# Patient Record
Sex: Female | Born: 1935 | Race: Black or African American | Hispanic: No | Smoking: Never smoker
Health system: Southern US, Community
[De-identification: ages and names within clinical notes are randomized; demographics above are authoritative.]

## PROBLEM LIST (undated history)

## (undated) DIAGNOSIS — I10 Essential (primary) hypertension: Secondary | ICD-10-CM

## (undated) DIAGNOSIS — E119 Type 2 diabetes mellitus without complications: Secondary | ICD-10-CM

## (undated) DIAGNOSIS — K219 Gastro-esophageal reflux disease without esophagitis: Secondary | ICD-10-CM

## (undated) DIAGNOSIS — M1990 Unspecified osteoarthritis, unspecified site: Secondary | ICD-10-CM

## (undated) DIAGNOSIS — E7800 Pure hypercholesterolemia, unspecified: Secondary | ICD-10-CM

## (undated) DIAGNOSIS — F0390 Unspecified dementia without behavioral disturbance: Secondary | ICD-10-CM

## (undated) HISTORY — PX: TONSILLECTOMY: SUR1361

## (undated) HISTORY — PX: APPENDECTOMY: SHX54

## (undated) HISTORY — PX: CATARACT EXTRACTION, BILATERAL: SHX1313

## (undated) HISTORY — PX: HEMORRHOID SURGERY: SHX153

## (undated) HISTORY — PX: CHOLECYSTECTOMY: SHX55

## (undated) HISTORY — PX: ABDOMINAL HYSTERECTOMY: SHX81

---

## 1998-10-17 ENCOUNTER — Ambulatory Visit (HOSPITAL_COMMUNITY): Admission: RE | Admit: 1998-10-17 | Discharge: 1998-10-17 | Payer: Self-pay | Admitting: Internal Medicine

## 1998-10-17 ENCOUNTER — Encounter: Payer: Self-pay | Admitting: Internal Medicine

## 2001-08-16 ENCOUNTER — Encounter: Payer: Self-pay | Admitting: Ophthalmology

## 2001-08-19 ENCOUNTER — Ambulatory Visit (HOSPITAL_COMMUNITY): Admission: RE | Admit: 2001-08-19 | Discharge: 2001-08-19 | Payer: Self-pay | Admitting: Ophthalmology

## 2001-12-12 ENCOUNTER — Ambulatory Visit (HOSPITAL_COMMUNITY): Admission: RE | Admit: 2001-12-12 | Discharge: 2001-12-12 | Payer: Self-pay | Admitting: Ophthalmology

## 2002-02-20 ENCOUNTER — Ambulatory Visit (HOSPITAL_COMMUNITY): Admission: RE | Admit: 2002-02-20 | Discharge: 2002-02-20 | Payer: Self-pay | Admitting: Family Medicine

## 2002-02-20 ENCOUNTER — Encounter: Payer: Self-pay | Admitting: Family Medicine

## 2003-04-17 ENCOUNTER — Ambulatory Visit (HOSPITAL_COMMUNITY): Admission: RE | Admit: 2003-04-17 | Discharge: 2003-04-17 | Payer: Self-pay | Admitting: Internal Medicine

## 2003-04-17 ENCOUNTER — Encounter: Payer: Self-pay | Admitting: Internal Medicine

## 2004-01-30 ENCOUNTER — Emergency Department (HOSPITAL_COMMUNITY): Admission: EM | Admit: 2004-01-30 | Discharge: 2004-01-30 | Payer: Self-pay | Admitting: Emergency Medicine

## 2004-05-07 ENCOUNTER — Ambulatory Visit: Payer: Self-pay | Admitting: Nurse Practitioner

## 2004-08-21 ENCOUNTER — Emergency Department (HOSPITAL_COMMUNITY): Admission: EM | Admit: 2004-08-21 | Discharge: 2004-08-21 | Payer: Self-pay | Admitting: Emergency Medicine
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
## 2005-08-18 ENCOUNTER — Ambulatory Visit (HOSPITAL_COMMUNITY): Admission: RE | Admit: 2005-08-18 | Discharge: 2005-08-18 | Payer: Self-pay | Admitting: Gastroenterology

## 2006-11-29 ENCOUNTER — Encounter: Admission: RE | Admit: 2006-11-29 | Discharge: 2006-11-29 | Payer: Self-pay | Admitting: Family Medicine

## 2007-01-04 ENCOUNTER — Emergency Department (HOSPITAL_COMMUNITY): Admission: EM | Admit: 2007-01-04 | Discharge: 2007-01-04 | Payer: Self-pay | Admitting: Family Medicine

## 2007-09-01 ENCOUNTER — Ambulatory Visit (HOSPITAL_COMMUNITY): Admission: RE | Admit: 2007-09-01 | Discharge: 2007-09-01 | Payer: Self-pay | Admitting: Ophthalmology

## 2008-05-30 IMAGING — CT CT ABDOMEN W/ CM
1 of 5 series · 14 of 36 positions shown, 19 images · IV contrast (READICAT/WATER & [ID] OMNI 300)
Comparison: None.

CLINICAL DATA: Abdominal pain and bloating.
ABDOMEN CT WITH CONTRAST:
TECHNIQUE: Multidetector CT imaging of the abdomen was performed following the standard protocol during bolus administration of intravenous contrast.
Contrast:  100 cc of Omnipaque 300.
TECHNIQUE: Multidetector CT imaging of the pelvis was performed following the standard protocol during bolus administration of intravenous contrast.

[Series 2: abdomen w/ · axial · 0.92mm/px · z∈[-375,-40]mm · 14 of 77 slices shown, 19 images]
[im 5/77  soft-tissue]
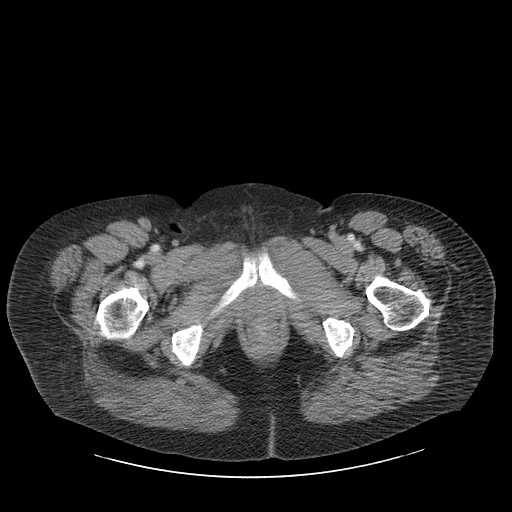
[im 5/77  bone]
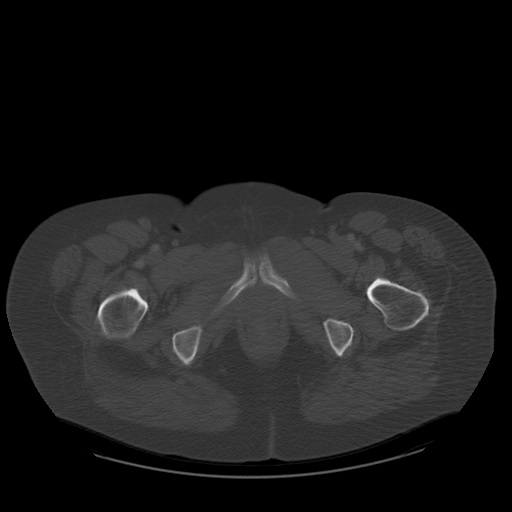
[im 9/77  soft-tissue]
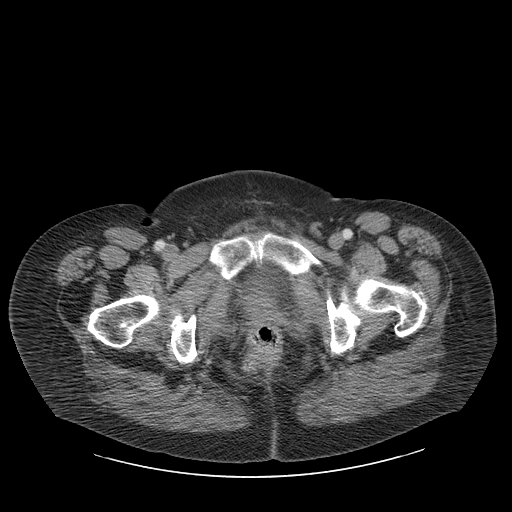
[im 17/77  soft-tissue]
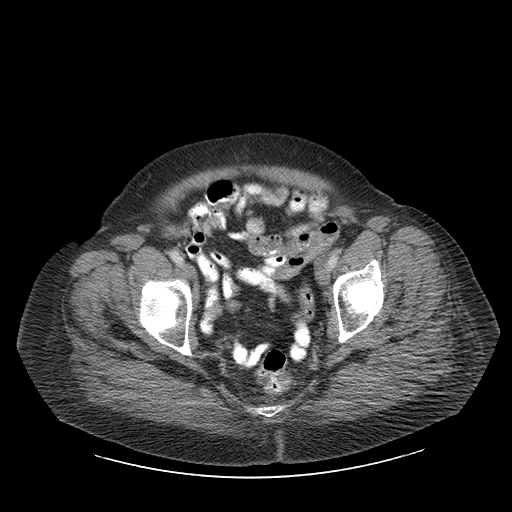
[im 22/77  soft-tissue]
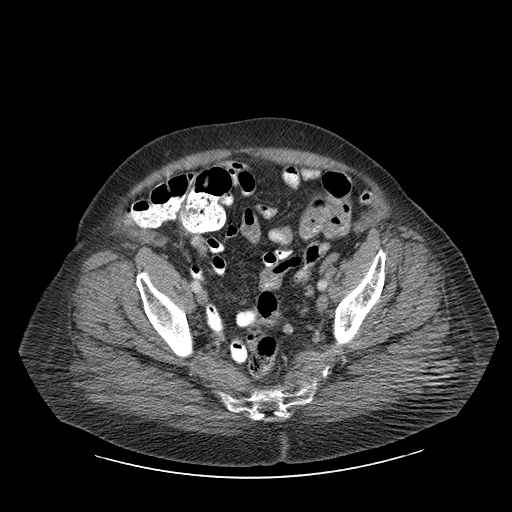
[im 26/77  soft-tissue]
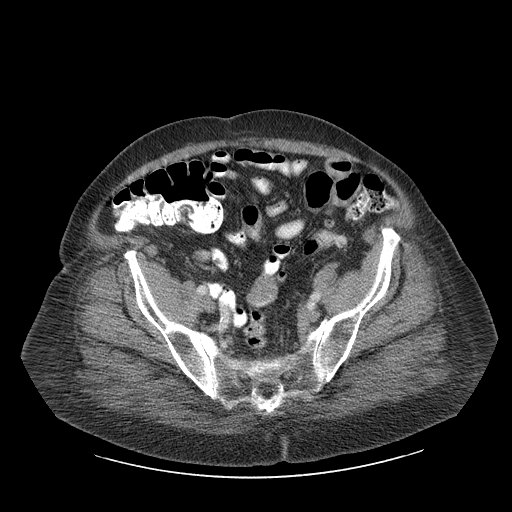
[im 34/77  soft-tissue]
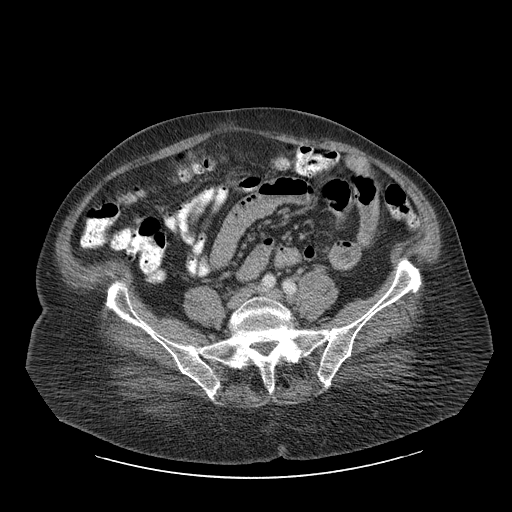
[im 39/77  soft-tissue]
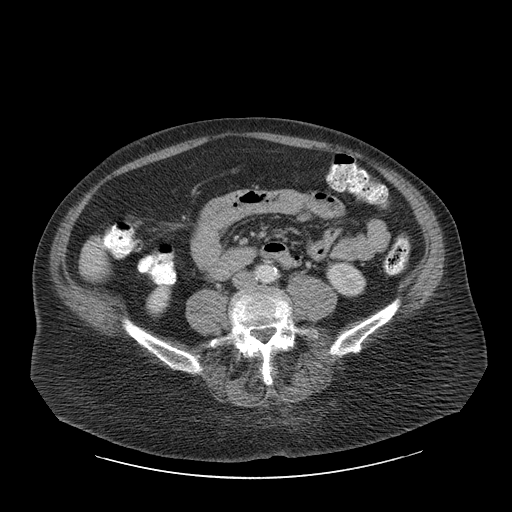
[im 43/77  soft-tissue]
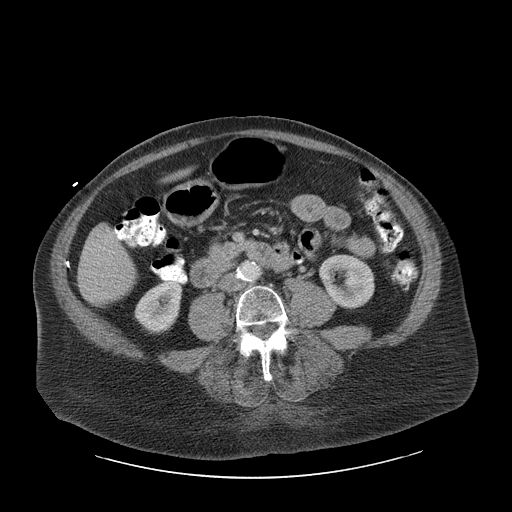
[im 51/77  soft-tissue]
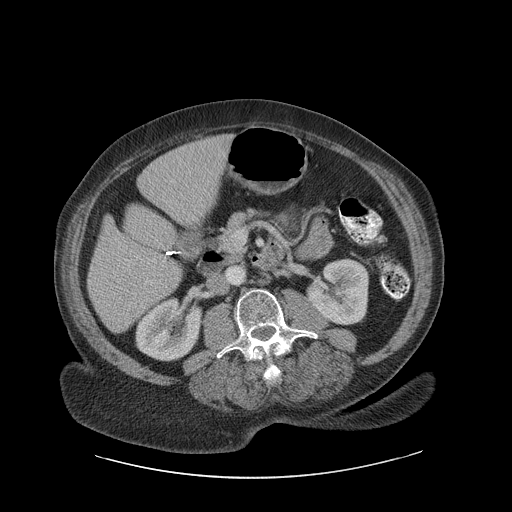
[im 51/77  bone]
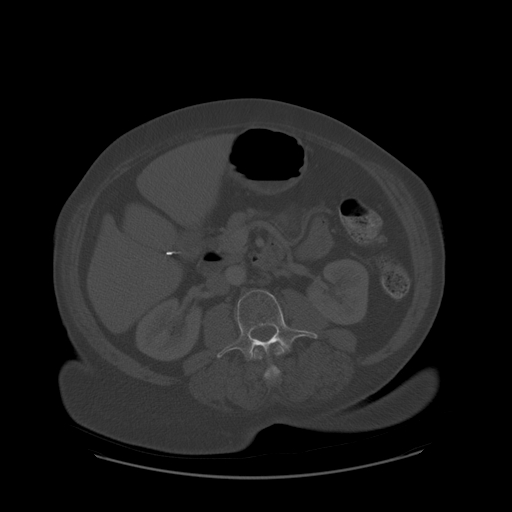
[im 55/77  soft-tissue]
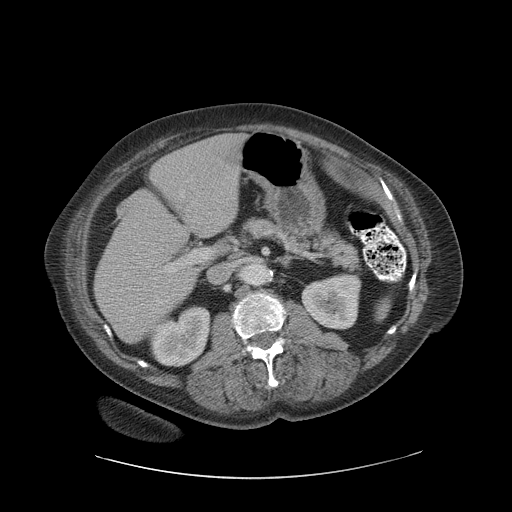
[im 60/77  soft-tissue]
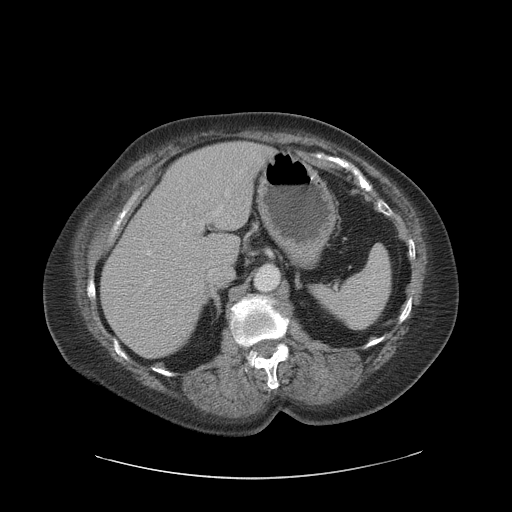
[im 60/77  lung]
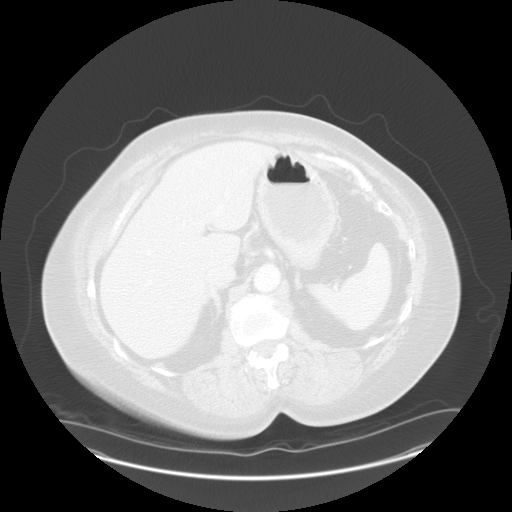
[im 64/77  lung]
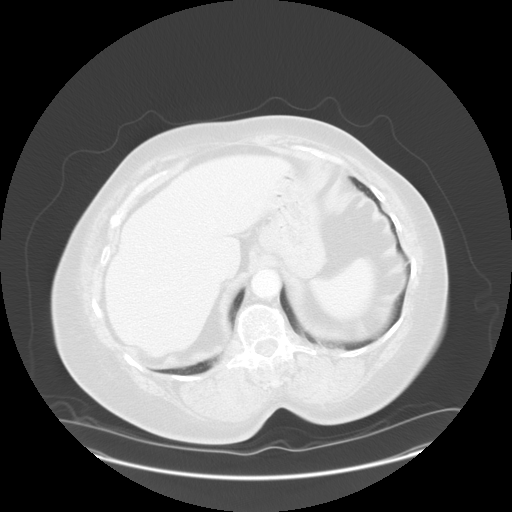
[im 68/77  soft-tissue]
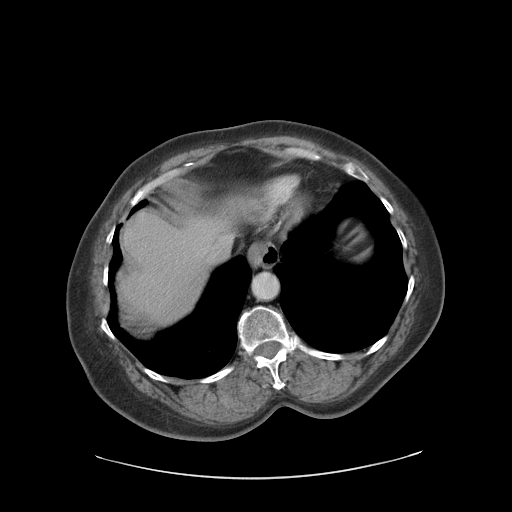
[im 68/77  lung]
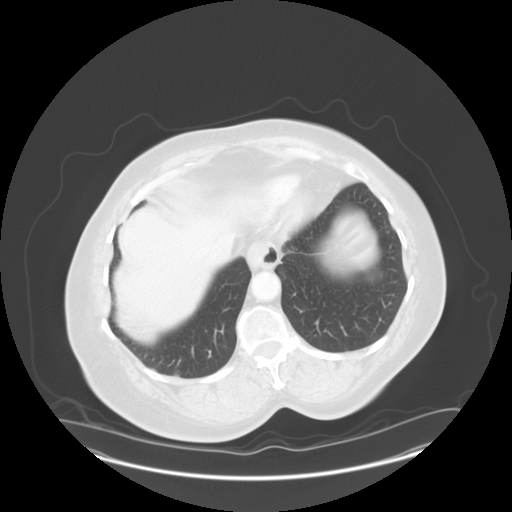
[im 72/77  soft-tissue]
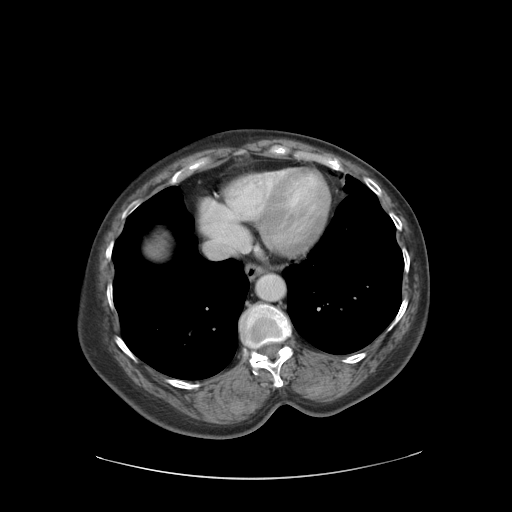
[im 72/77  lung]
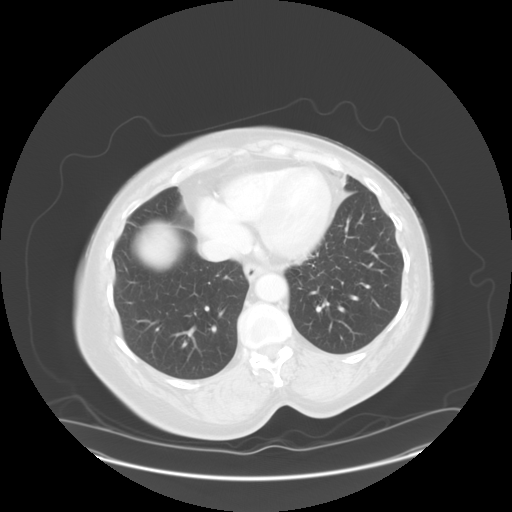

[14 of 36 positions shown; findings below may reference images not displayed]

FINDINGS: Liver unremarkable.  Cholecystectomy.  Adrenal glands unremarkable.  Small stone is seen in the lower pole of the right kidney without obstruction.  Small low attenuation lesion in the interpolar left kidney.  Probable left parapelvic cyst.  Spleen, pancreas, stomach, and small bowel are unremarkable.  Duodenal diverticulum is incidentally noted. No pathologically enlarged lymph nodes.  No free fluid.  Atherosclerotic calcification of the arterial vasculature.
IMPRESSION: 1.  No acute findings.
2.  obstructing right nephrolithiasis.
PELVIS CT WITH CONTRAST:
FINDINGS: Colon unremarkable.  No free fluid.  No pathologically enlarged lymph nodes.  No worrisome lytic or sclerotic lesions.  There is a diverticulum or saccule off the right posterolateral aspect of the bladder.
IMPRESSION: No acute findings.
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
## 2009-05-19 ENCOUNTER — Emergency Department (HOSPITAL_COMMUNITY): Admission: EM | Admit: 2009-05-19 | Discharge: 2009-05-19 | Payer: Self-pay | Admitting: Emergency Medicine

## 2009-09-01 ENCOUNTER — Emergency Department (HOSPITAL_COMMUNITY): Admission: EM | Admit: 2009-09-01 | Discharge: 2009-09-01 | Payer: Self-pay | Admitting: Emergency Medicine

## 2010-08-24 ENCOUNTER — Encounter: Payer: Self-pay | Admitting: Family Medicine

## 2010-10-19 LAB — POCT I-STAT, CHEM 8
BUN: 9 mg/dL (ref 6–23)
Calcium, Ion: 1.12 mmol/L (ref 1.12–1.32)
Chloride: 106 mEq/L (ref 96–112)
Creatinine, Ser: 0.8 mg/dL (ref 0.4–1.2)
Glucose, Bld: 76 mg/dL (ref 70–99)
HCT: 38 % (ref 36.0–46.0)
Hemoglobin: 12.9 g/dL (ref 12.0–15.0)
Potassium: 3.7 mEq/L (ref 3.5–5.1)
Sodium: 142 mEq/L (ref 135–145)
TCO2: 31 mmol/L (ref 0–100)

## 2010-11-18 IMAGING — CR DG KNEE COMPLETE 4+V*L*
4 series · 4 of 4 positions shown · non-contrast
Comparison: None

CLINICAL DATA: Fall, left knee pain

LEFT KNEE - COMPLETE 4+ VIEW

[view not recorded (1 of 4)]
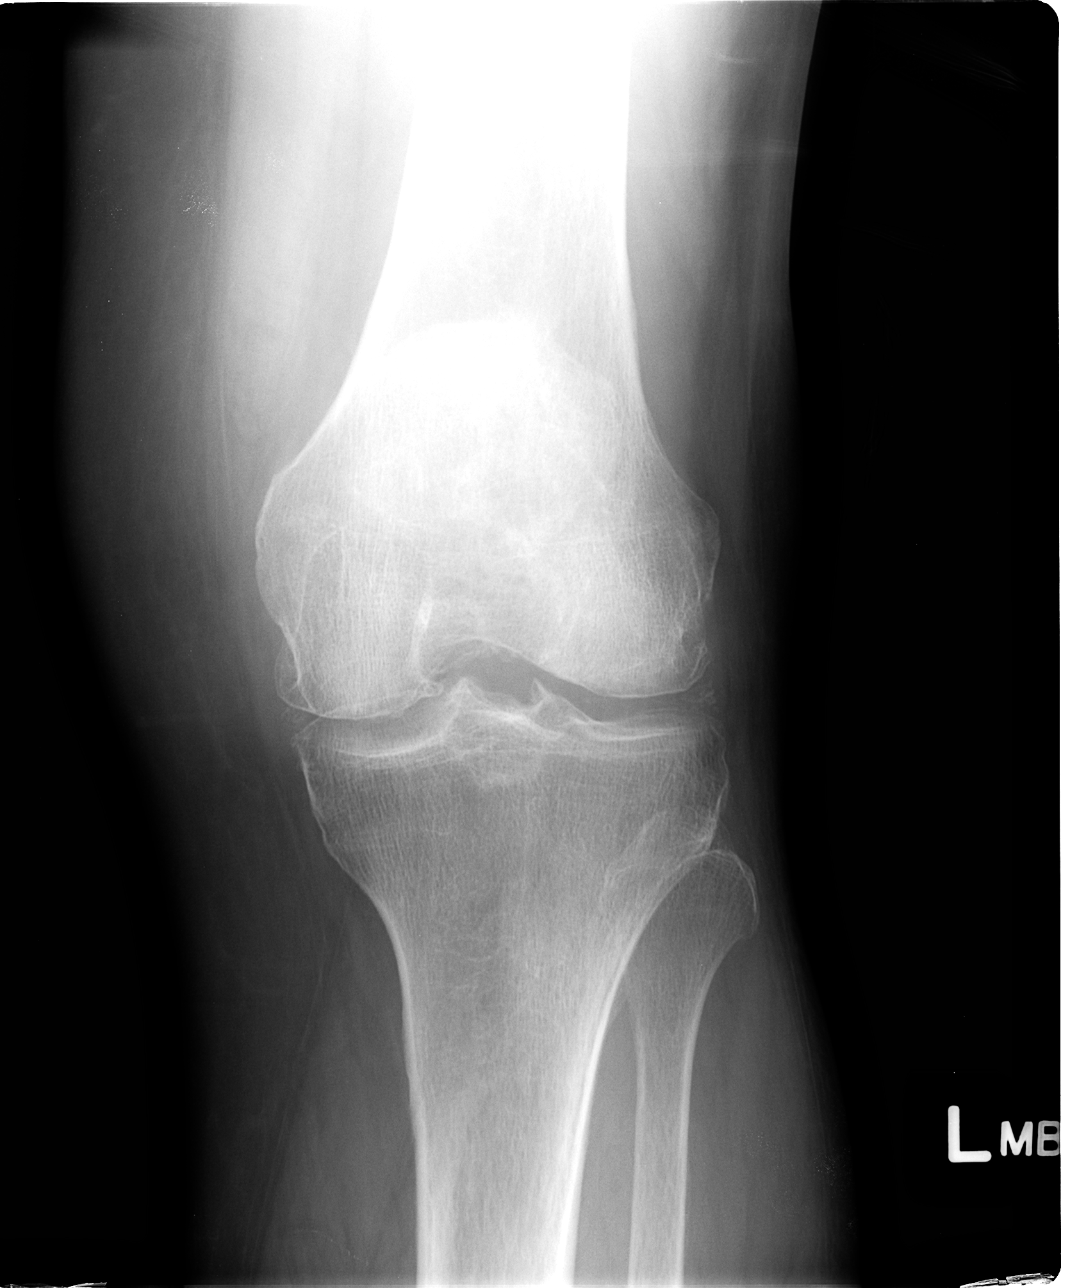

[view not recorded (2 of 4)]
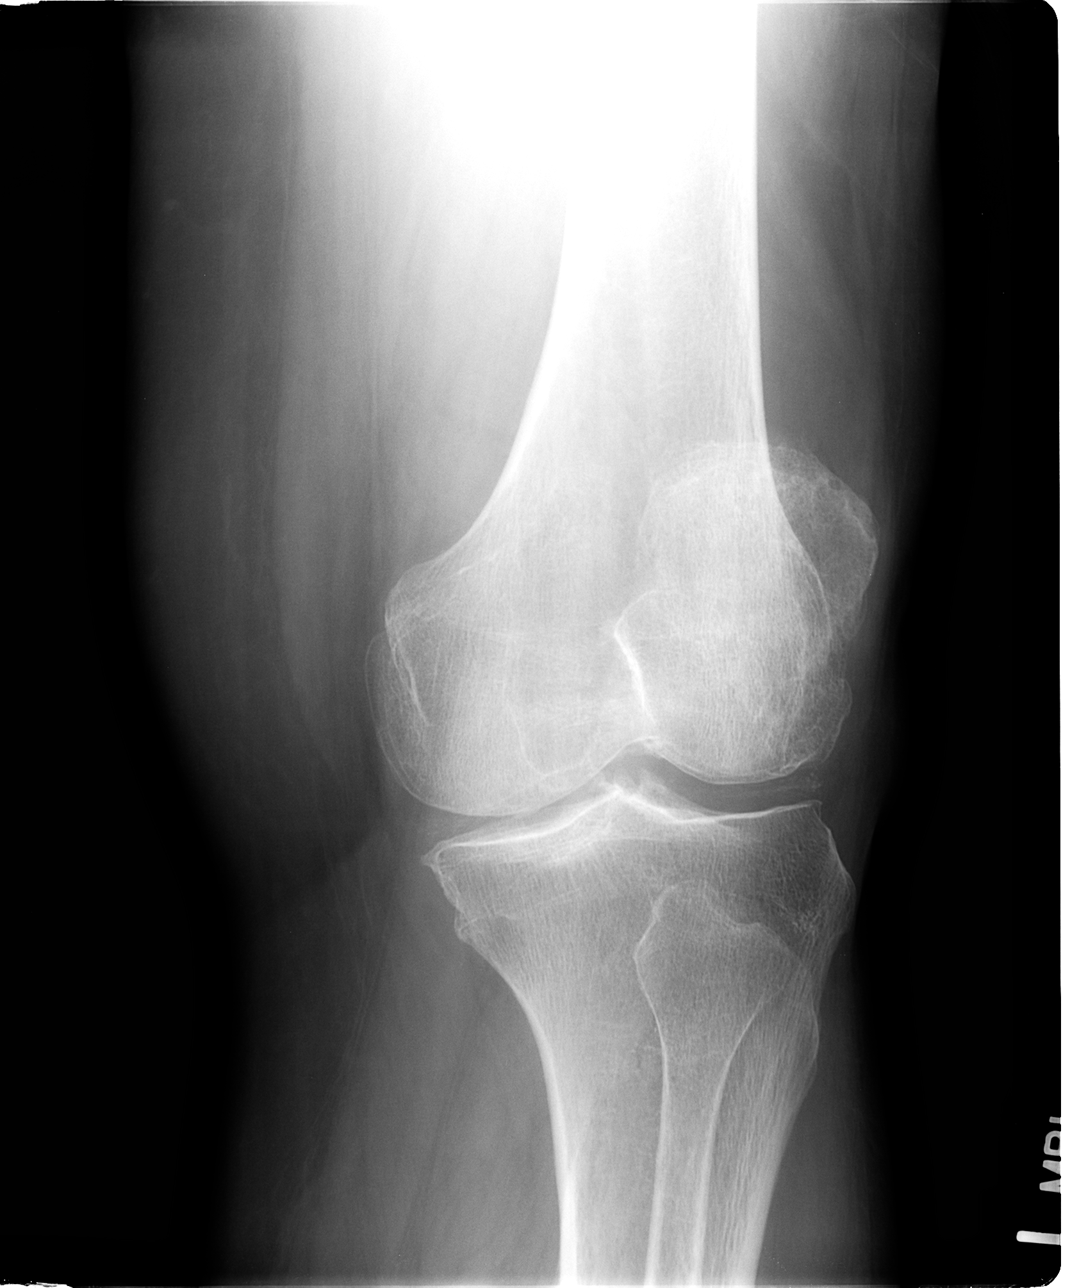

[view not recorded (3 of 4)]
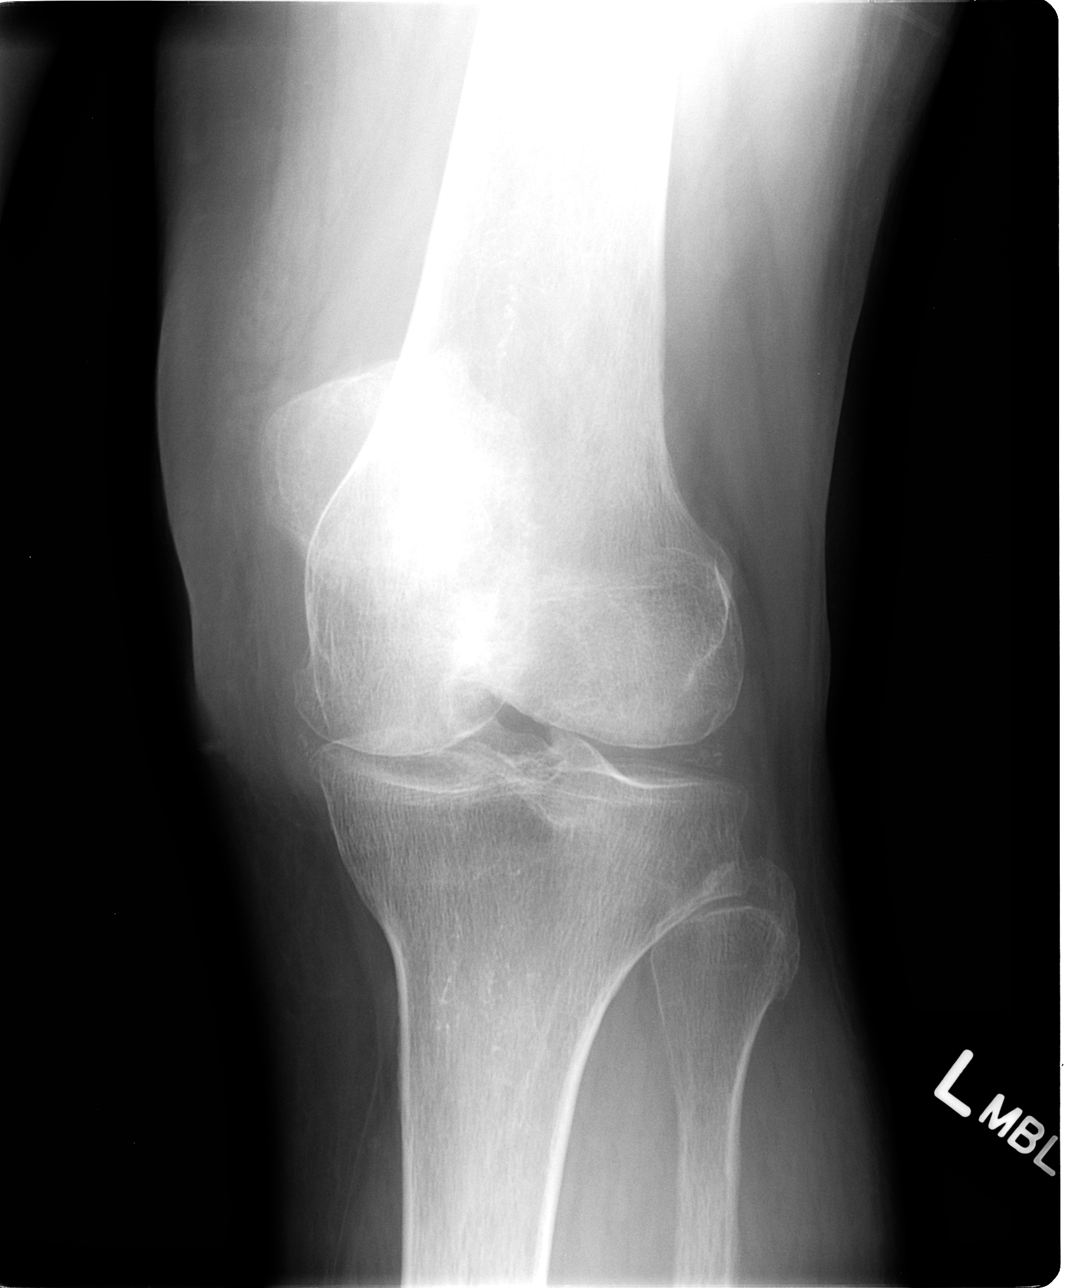

[view not recorded (4 of 4)]
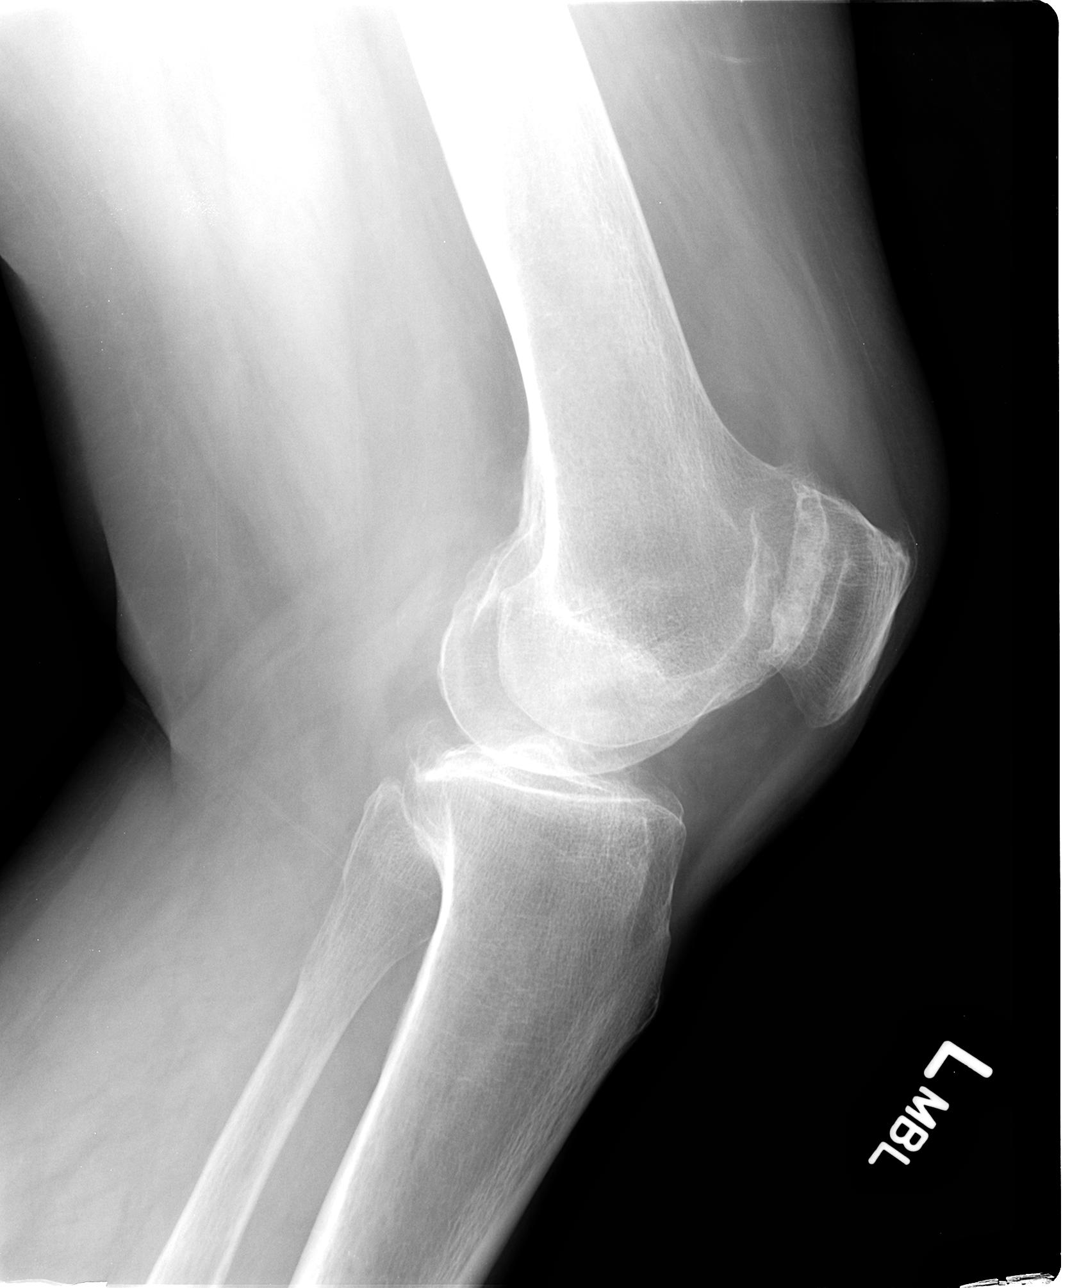

[4 of 4 positions shown; findings below may reference images not displayed]

FINDINGS: Chondrocalcinosis is noted.  Moderate tricompartmental
degenerative change present without fracture line identified.
Tibial spine spurring noted.  No suprapatellar effusion.  No
dislocation.
IMPRESSION: No acute osseous abnormality.

## 2010-11-20 ENCOUNTER — Inpatient Hospital Stay (INDEPENDENT_AMBULATORY_CARE_PROVIDER_SITE_OTHER)
Admission: RE | Admit: 2010-11-20 | Discharge: 2010-11-20 | Disposition: A | Discharge status: Home / Self Care | Payer: MEDICARE | Source: Ambulatory Visit | Attending: Family Medicine | Admitting: Family Medicine

## 2010-11-20 DIAGNOSIS — H6120 Impacted cerumen, unspecified ear: Secondary | ICD-10-CM

## 2010-12-19 NOTE — Op Note (Signed)
[name], [name]             ACCOUNT NO.:  [id number]   MEDICAL RECORD NO.:  [id number]          PATIENT TYPE:  AMB   LOCATION:  SDS                          FACILITY:  [facility]   PHYSICIAN:  Salley Scarlet., M.D.DATE OF BIRTH:  02-06-1931   DATE OF PROCEDURE:  DATE OF DISCHARGE:  09/01/2007                               OPERATIVE REPORT   PREOP DIAGNOSIS:  Immature cataract, right eye.   POSTOP DIAGNOSIS:  Immature cataract, right eye.   OPERATION:  Kelman phacoemulsification cataract, right eye.   ANESTHESIA:  Local, given Xylocaine 2% with Marcaine 0.75% and Wydase.   JUSTIFICATION FOR PROCEDURE:  This is a 77 year old lady who I know and  had uneventful cataract extraction of the left eye 2-3 years ago.  She  has developed a cataract of the right eye and complains of blurring of  vision with difficulty seeing to read.  She was evaluated and found to  have a visual acuity best corrected at 20/50 on the right, 20/25 on the  left.  There was a posterior subcapsular cataract present on the right  eye with an intraocular lens present in the left.  Cataract extraction  of the right eye was recommended and she is admitted at this time for  that purpose.   PROCEDURE:  Under the influence of IV sedation, akinesia and  retrobulbar anesthesia was given.  The patient was prepped and draped in  the usual manner.  The lid speculum was inserted under the upper and  lower lid of the right eye and a 4-0 silk traction suture  was passed  through the belly of the superior rectus muscle for retraction. A fornix-  based conjunctival flap was turned and hemostasis achieved using  cautery.  An incision was made in the sclera over the limbus.  This  incision was dissected down to clear cornea using a crescent blade.  A  stab wound incision was made at the 1:30 o'clock position.  OcuCoat was  injected into the eye through the stab wound incision.  The anterior  chamber was then entered  through the corneoscleral incision at 11:30  o'clock position using a 2.75 mm keratome.  An anterior capsulotomy was  done using a bent 25-gauge needle.  The nucleus was hydrodissected using  Xylocaine.  The KPE handpiece was passed into the eye and the nucleus  was emulsified without difficulty.  There was digital cortical materials  aspirated.  The posterior capsule was polished using a Holladay  polisher.  The wound was widened slightly to accommodate a portable  silicone lens.  The lens was seated into the eye behind the iris without  difficulty.  The anterior chamber was reformed and the pupil was  constricted using Miochol.  The ellipse of the wound well hydrated and  tested to make sure that there was no leak.  After ascertaining that  there was no leak, the conjunctiva was closed over the wound using  thermal cautery.  1 mL of Celestone, 0.5 mL of gentamicin were injected  into the subconjunctiva.  Maxitrol ophthalmic ointment and Pilopine  ointment were applied along  with a patch and Fox shield.  The patient  tolerated the procedure well and was discharged to the Post Anesthesia  Recovery Room in  satisfactory condition.  She is instructed to rest today, to take  Vicodin every 4 hours as needed for pain and to see me in the office in  1 week for further evaluation.   DISCHARGE DIAGNOSIS:  Immature cataract, right eye.      Salley Scarlet., M.D.  Electronically Signed     TB/MEDQ  D:  09/06/2007  T:  09/06/2007  Job:  [id number]

## 2010-12-19 NOTE — Op Note (Signed)
[place]  Patient:    [name], [name] Visit Number: [id number] MRN: [id number]          Service Type: DSU Location: [facility] [id number] [id number] Attending Physician:  Karenann Cai Dictated by:   Marya Landry Carlyle Lipa., M.D. Admit Date:  08/19/2001 Discharge Date: 08/19/2001                             Operative Report  PREOPERATIVE DIAGNOSIS:  Immature catarct, left eye.  POSTOPERATIVE DIAGNOSIS:  Immature catarct, left eye.  PROCEDURE:  Kellman phacoemulsification cataract left eye.  ANESTHESIA:  Local using Xylocaine 2% with Marcaine 0.75% and Wydase.  INDICATIONS:  This is a 77 year old lady who complains of blurring of vision with difficulty seeing to read.  She was evaluated and found to have a visual acuity best corrected to 20/50 on the right and 20/60 on the left.  There are bilateral immature cataracts slightly worse on the left than the right. Cataract extraction with intraocular lens implantation is recommended.  She is admitted at this time for that purpose.  DESCRIPTION OF PROCEDURE:  Under the influence of IV sedation and Van Lint akinesia, a retrobulbar anesthesia was given.  The patient was prepped and draped in the usual fashion.  The lid speculum was inserted under the upper and lower lid of the left eye and a 4-0 silk traction suture was passed through the belly of the superior rectus muscle for traction.  A fornix-based conjunctival flap was turned and hemostasis achieved by using cautery.  An incision was then made in the sclera approximately 1 mm posterior to the limbus.  This incision was dissected down to clear cornea using the crescent blade.  A sideport incision was made at the 1:30 position.  Occucoat was injected into the eye through the sideport incision.  The anterior chamber was then entered through the corneoscleral tunneling incision at the 11:30 position using a 3.2 mm Keratome.  An anterior  capsulotomy was done using bent 25 gauge needle and the nucleus was hydrodissected using Xylocaine.  The KPE handpiece was fastened to the eye and the nucleus was emulsified without difficulty.  The residual cortical material was aspirated.  The posterior capsule was polished using olive-tip polisher.  The wound was widened slightly to accommodate a foldable silicon lens.  This lens was seated into the eye without difficulty.  The anterior chamber was reformed and the pupil was constricted using Miochol.  The residual Occucoat was aspirated from the eye. The lips of the wound were hydrated and then tested to make sure there was no leak.  Ascertaining that there was no wound leak, the conjunctiva was closed over the sclera using thermocautery.  1 cc of Celestone and 0.5 cc of gentamicin were injected subconjunctivally.  Maxitrol ophthalmic ointment and Pilopine ointment were applied along with the patch and Fox shield.  The patient tolerated the procedure well and was discharged to postanesthesia recovery room in satisfactory condition.  She was instructed to rest today and to take Vicodin every four hours as needed for pain and to see me in the office tomorrow for further evaluation. Dictated by:   Marya Landry Carlyle Lipa., M.D. Attending Physician:  Karenann Cai DD:  08/20/01 TD:  08/22/01 Job: [id number]

## 2011-03-03 IMAGING — CT CT CERVICAL SPINE W/O CM
4 series · 16 of 33 positions shown, 19 images · non-contrast
Comparison: spine films 08/21/2004

CLINICAL DATA: Left-sided pain, fell in tube, neck pain

CT CERVICAL SPINE WITHOUT CONTRAST
TECHNIQUE: Multidetector CT imaging of the cervical spine was
performed. Multiplanar CT image reconstructions were also
generated.

[Series 4: c_spine 2.0 b41s detail · axial · 0.23mm/px · z∈[+1034,+1158]mm · 6 of 88 slices shown, 8 images]
[im 13/88  soft-tissue]
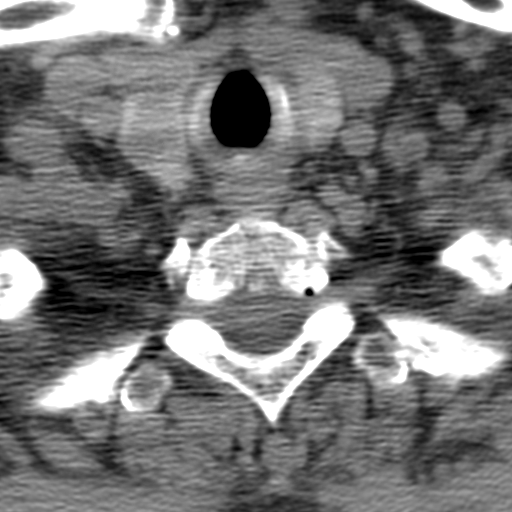
[im 13/88  bone]
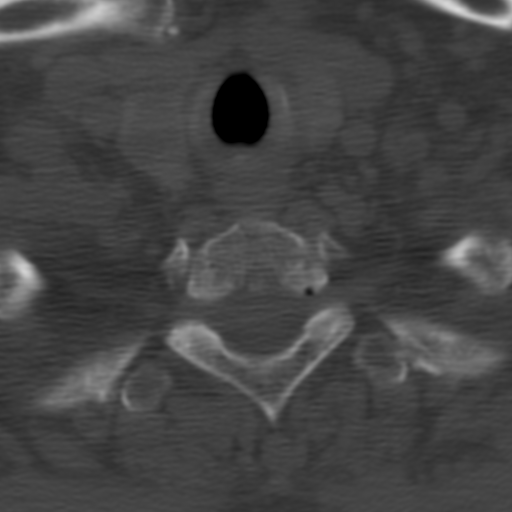
[im 25/88  bone]
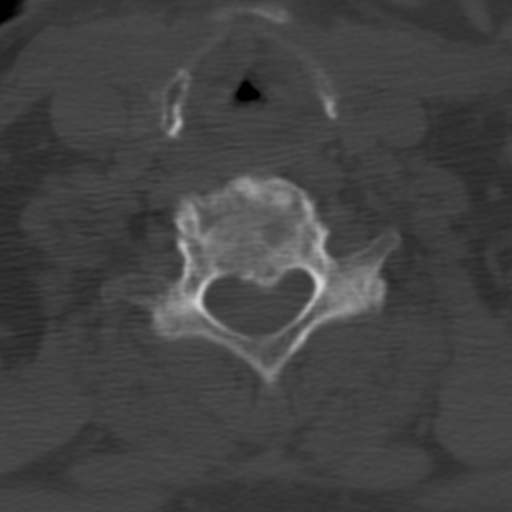
[im 38/88  bone]
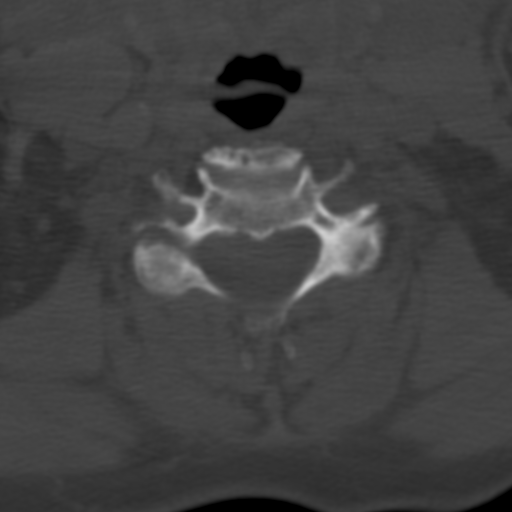
[im 50/88  bone]
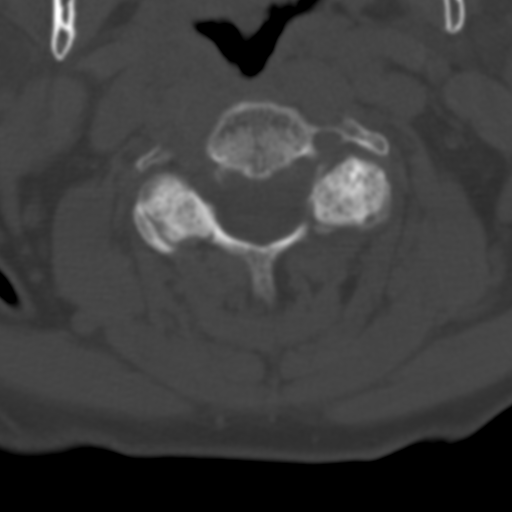
[im 63/88  soft-tissue]
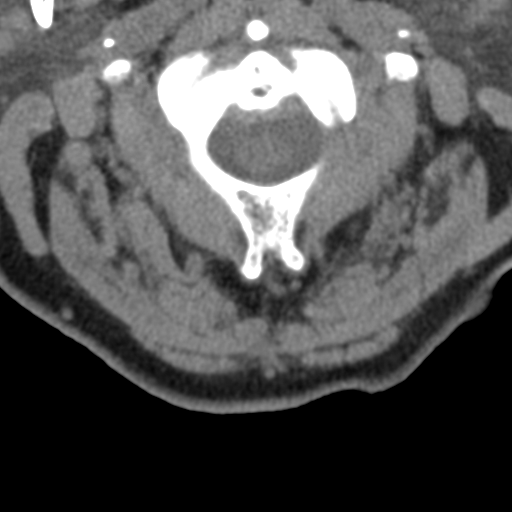
[im 63/88  bone]
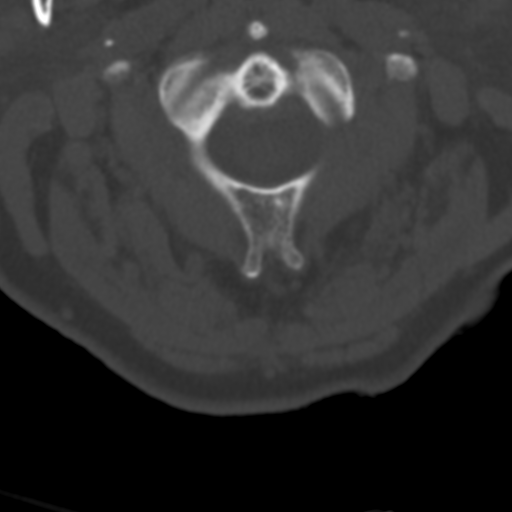
[im 75/88  bone]
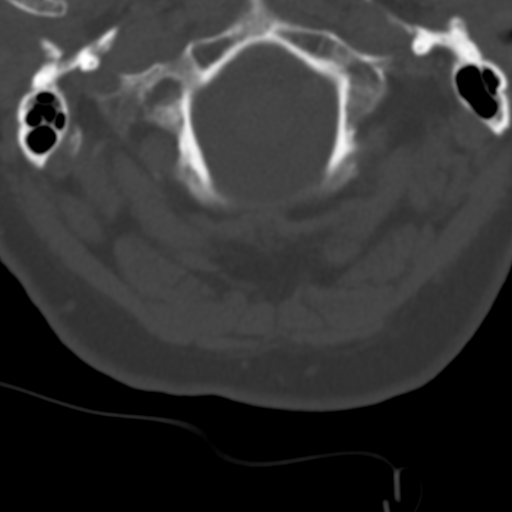

[Series 602: <mpr thick range> · coronal · 0.34mm/px · 3 of 44 slices shown]
[im 9/44  bone]
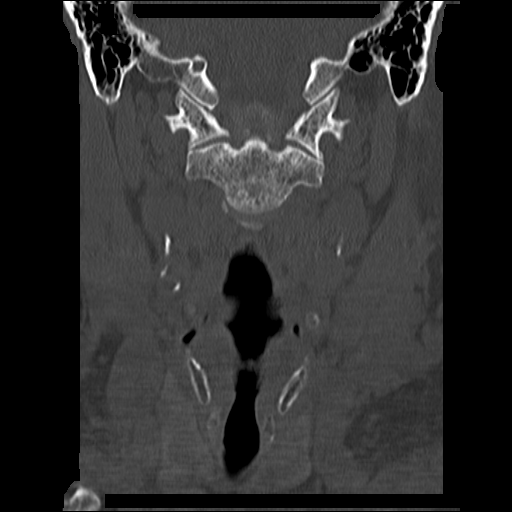
[im 18/44  bone]
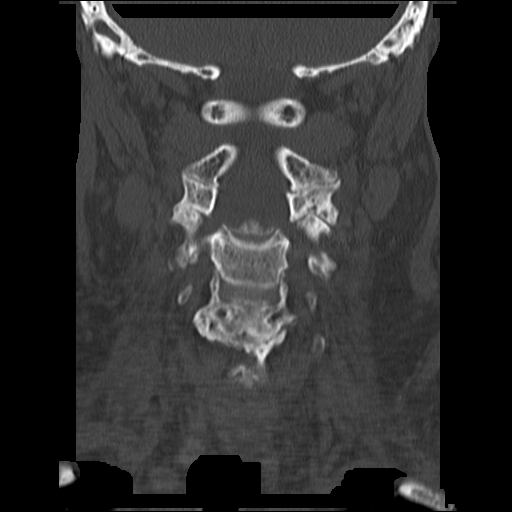
[im 26/44  bone]
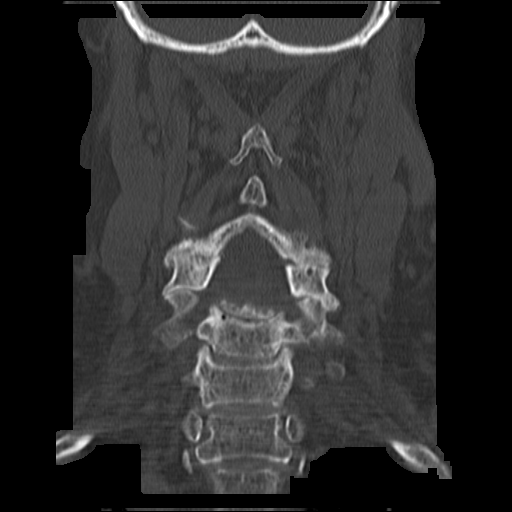

[Series 603: <mpr thick range(1)> · sagittal · 0.34mm/px · 5 of 47 slices shown, 6 images]
[im 16/47  bone]
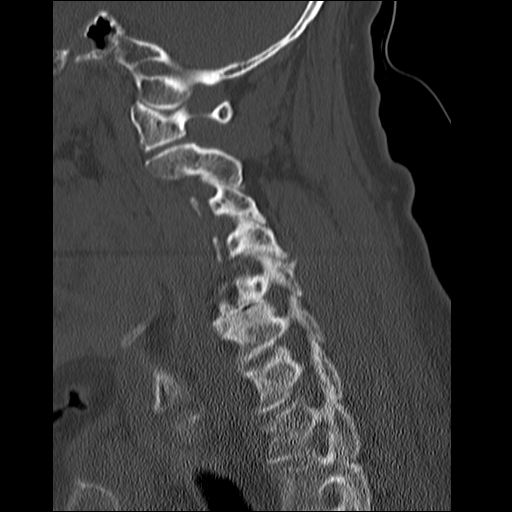
[im 20/47  bone]
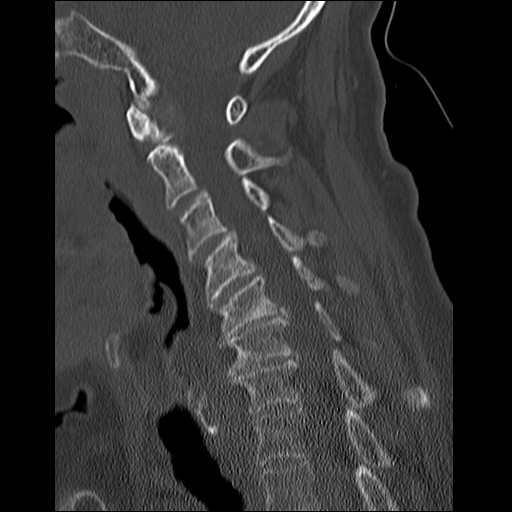
[im 24/47  soft-tissue]
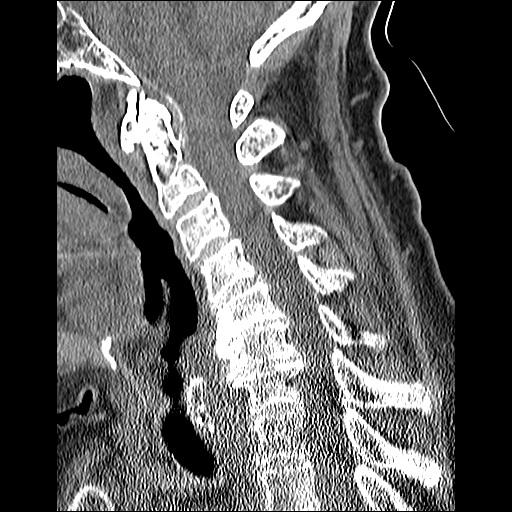
[im 24/47  bone]
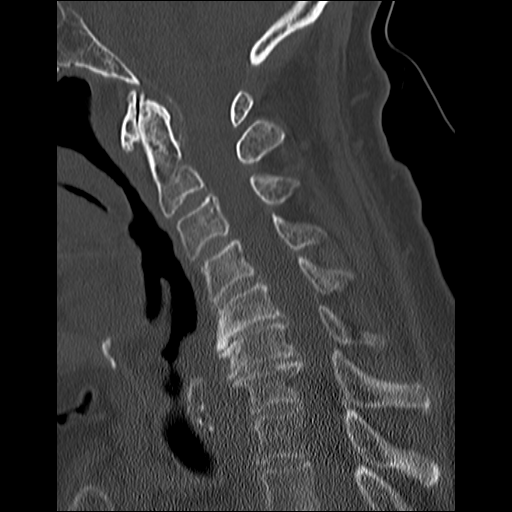
[im 27/47  bone]
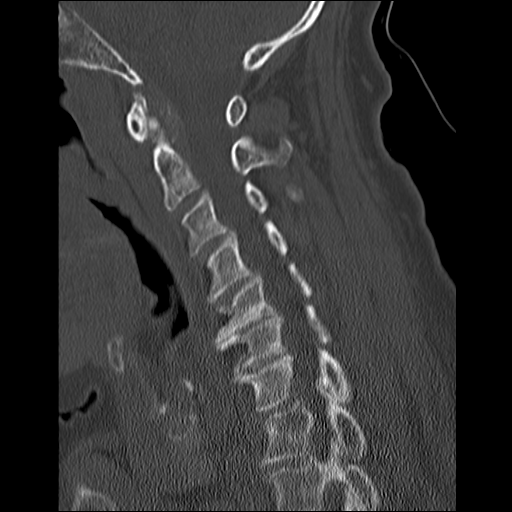
[im 31/47  bone]
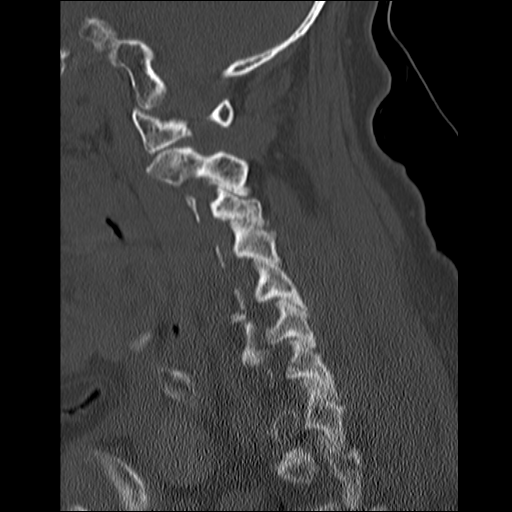

[Series 604: <mpr thick range(2)> · axial · 0.34mm/px · z∈[+1022,+1046]mm · 2 of 68 slices shown]
[im 14/68  bone]
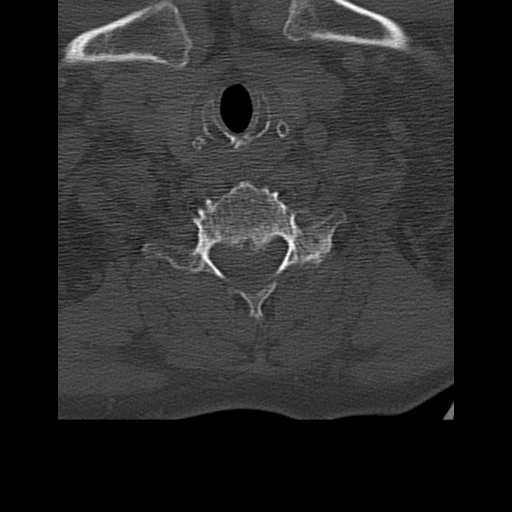
[im 27/68  bone]
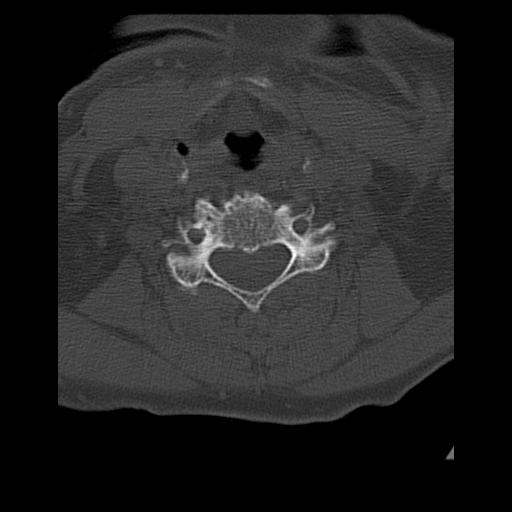

[16 of 33 positions shown; findings below may reference images not displayed]

FINDINGS: No prevertebral soft tissue swelling.  There is reversal
of the normal cervical lordosis.  There is multilevel disc
osteophytic disease most prominent from C4-C7.  There are multiple
anterior posterior osteophytes at these levels.  Normal facet
articulation.  There is severe facet hypertrophy and uncovertebral
hypertrophy noted.  Normal craniocervical junction.  There is some
calcification of the ligaments posterior the dens.

No evidence of paraspinal or epidural hematoma.

There are low density lesions within the thyroid gland.
IMPRESSION: 1.  No evidence of cervical spine fracture.
2.  Multilevel disc osteophytic disease and calcification of
ligaments posterior to the dens.
3.  Low density lesions within the thyroid gland.  Consider thyroid
ultrasound.
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
## 2011-04-23 LAB — CBC
HCT: 39.9
Hemoglobin: 12.8
MCHC: 32.2
MCV: 78.1
Platelets: 191
RBC: 5.11
RDW: 16.5 — ABNORMAL HIGH
WBC: 9.4

## 2011-04-23 LAB — URINALYSIS, ROUTINE W REFLEX MICROSCOPIC
Bilirubin Urine: NEGATIVE
Glucose, UA: NEGATIVE
Hgb urine dipstick: NEGATIVE
Ketones, ur: NEGATIVE
Nitrite: NEGATIVE
Protein, ur: NEGATIVE
Specific Gravity, Urine: 1.016
Urobilinogen, UA: 1
pH: 7

## 2011-04-23 LAB — BASIC METABOLIC PANEL
BUN: 7
CO2: 30
Calcium: 10.1
Chloride: 99
Creatinine, Ser: 0.74
GFR calc Af Amer: >60
GFR calc non Af Amer: >60
Glucose, Bld: 160 — ABNORMAL HIGH
Potassium: 4.1
Sodium: 137

## 2014-02-28 ENCOUNTER — Encounter (HOSPITAL_COMMUNITY): Payer: Self-pay | Admitting: Emergency Medicine

## 2014-02-28 ENCOUNTER — Emergency Department (HOSPITAL_COMMUNITY): Payer: Medicare Other

## 2014-02-28 ENCOUNTER — Emergency Department (HOSPITAL_COMMUNITY)
Admission: EM | Admit: 2014-02-28 | Discharge: 2014-02-28 | Disposition: A | Discharge status: Home / Self Care | Payer: Medicare Other | Attending: Emergency Medicine | Admitting: Emergency Medicine

## 2014-02-28 DIAGNOSIS — E119 Type 2 diabetes mellitus without complications: Secondary | ICD-10-CM | POA: Insufficient documentation

## 2014-02-28 DIAGNOSIS — K219 Gastro-esophageal reflux disease without esophagitis: Secondary | ICD-10-CM | POA: Insufficient documentation

## 2014-02-28 DIAGNOSIS — M175 Other unilateral secondary osteoarthritis of knee: Secondary | ICD-10-CM | POA: Insufficient documentation

## 2014-02-28 DIAGNOSIS — I10 Essential (primary) hypertension: Secondary | ICD-10-CM | POA: Diagnosis not present

## 2014-02-28 DIAGNOSIS — E7800 Pure hypercholesterolemia, unspecified: Secondary | ICD-10-CM | POA: Insufficient documentation

## 2014-02-28 DIAGNOSIS — Z79899 Other long term (current) drug therapy: Secondary | ICD-10-CM | POA: Diagnosis not present

## 2014-02-28 DIAGNOSIS — M25561 Pain in right knee: Secondary | ICD-10-CM

## 2014-02-28 DIAGNOSIS — M25569 Pain in unspecified knee: Secondary | ICD-10-CM | POA: Diagnosis not present

## 2014-02-28 DIAGNOSIS — F0390 Unspecified dementia without behavioral disturbance: Secondary | ICD-10-CM | POA: Insufficient documentation

## 2014-02-28 DIAGNOSIS — Z7982 Long term (current) use of aspirin: Secondary | ICD-10-CM | POA: Insufficient documentation

## 2014-02-28 DIAGNOSIS — Z794 Long term (current) use of insulin: Secondary | ICD-10-CM | POA: Diagnosis not present

## 2014-02-28 HISTORY — DX: Unspecified osteoarthritis, unspecified site: M19.90

## 2014-02-28 HISTORY — DX: Unspecified dementia, unspecified severity, without behavioral disturbance, psychotic disturbance, mood disturbance, and anxiety: F03.90

## 2014-02-28 HISTORY — DX: Gastro-esophageal reflux disease without esophagitis: K21.9

## 2014-02-28 HISTORY — DX: Type 2 diabetes mellitus without complications: E11.9

## 2014-02-28 HISTORY — DX: Essential (primary) hypertension: I10

## 2014-02-28 HISTORY — DX: Pure hypercholesterolemia, unspecified: E78.00

## 2014-02-28 MED ORDER — TRAMADOL HCL 50 MG PO TABS
50.0000 mg | ORAL_TABLET | Freq: Once | ORAL | Status: AC
  Administered 2014-02-28: 50 mg via ORAL
  Filled 2014-02-28: qty 1

## 2014-02-28 MED ORDER — TRAMADOL HCL 50 MG PO TABS: 50.0000 mg | ORAL_TABLET | Freq: Four times a day (QID) | ORAL | Status: DC | PRN

## 2014-02-28 NOTE — ED Notes (Signed)
Pt reports "feeling something pop in her right knee" and now having pain to left knee down into lower leg. No swelling noted. Reports pain is making it more difficult for her to ambulate.

## 2014-02-28 NOTE — Discharge Instructions (Signed)
Use ice to sore areas, for 2 days, after that use heat. Wear the knee immobilizer, as needed for comfort when you ambulate. See your orthopedic doctor for a checkup in one week.    Arthralgia Your caregiver has diagnosed you as suffering from an arthralgia. Arthralgia means there is pain in a joint. This can come from many reasons including:  Bruising the joint which causes soreness (inflammation) in the joint.  Wear and tear on the joints which occur as we grow older (osteoarthritis).  Overusing the joint.  Various forms of arthritis.  Infections of the joint. Regardless of the cause of pain in your joint, most of these different pains respond to anti-inflammatory drugs and rest. The exception to this is when a joint is infected, and these cases are treated with antibiotics, if it is a bacterial infection. HOME CARE INSTRUCTIONS   Rest the injured area for as long as directed by your caregiver. Then slowly start using the joint as directed by your caregiver and as the pain allows. Crutches as directed may be useful if the ankles, knees or hips are involved. If the knee was splinted or casted, continue use and care as directed. If an stretchy or elastic wrapping bandage has been applied today, it should be removed and re-applied every 3 to 4 hours. It should not be applied tightly, but firmly enough to keep swelling down. Watch toes and feet for swelling, bluish discoloration, coldness, numbness or excessive pain. If any of these problems (symptoms) occur, remove the ace bandage and re-apply more loosely. If these symptoms persist, contact your caregiver or return to this location.  For the first 24 hours, keep the injured extremity elevated on pillows while lying down.  Apply ice for 15-20 minutes to the sore joint every couple hours while awake for the first half day. Then 03-04 times per day for the first 48 hours. Put the ice in a plastic bag and place a towel between the bag of ice and  your skin.  Wear any splinting, casting, elastic bandage applications, or slings as instructed.  Only take over-the-counter or prescription medicines for pain, discomfort, or fever as directed by your caregiver. Do not use aspirin immediately after the injury unless instructed by your physician. Aspirin can cause increased bleeding and bruising of the tissues.  If you were given crutches, continue to use them as instructed and do not resume weight bearing on the sore joint until instructed. Persistent pain and inability to use the sore joint as directed for more than 2 to 3 days are warning signs indicating that you should see a caregiver for a follow-up visit as soon as possible. Initially, a hairline fracture (break in bone) may not be evident on X-rays. Persistent pain and swelling indicate that further evaluation, non-weight bearing or use of the joint (use of crutches or slings as instructed), or further X-rays are indicated. X-rays may sometimes not show a small fracture until a week or 10 days later. Make a follow-up appointment with your own caregiver or one to whom we have referred you. A radiologist (specialist in reading X-rays) may read your X-rays. Make sure you know how you are to obtain your X-ray results. Do not assume everything is normal if you do not hear from [place]. SEEK MEDICAL CARE IF: Bruising, swelling, or pain increases. SEEK IMMEDIATE MEDICAL CARE IF:   Your fingers or toes are numb or blue.  The pain is not responding to medications and continues to stay the  same or get worse.  The pain in your joint becomes severe.  You develop a fever over 102 F (38.9 C).  It becomes impossible to move or use the joint. MAKE SURE YOU:   Understand these instructions.  Will watch your condition.  Will get help right away if you are not doing well or get worse. Document Released: 07/20/2005 Document Revised: 10/12/2011 Document Reviewed: 03/07/2008 [facility] Patient Information  2015 [place] [place]. This information is not intended to replace advice given to you by your health care provider. Make sure you discuss any questions you have with your health care provider.  Knee Pain The knee is the complex joint between your thigh and your lower leg. It is made up of bones, tendons, ligaments, and cartilage. The bones that make up the knee are:  The femur in the thigh.  The tibia and fibula in the lower leg.  The patella or kneecap riding in the groove on the lower femur. CAUSES  Knee pain is a common complaint with many causes. A few of these causes are:  Injury, such as:  A ruptured ligament or tendon injury.  Torn cartilage.  Medical conditions, such as:  Gout  Arthritis  Infections  Overuse, over training, or overdoing a physical activity. Knee pain can be minor or severe. Knee pain can accompany debilitating injury. Minor knee problems often respond well to self-care measures or get well on their own. More serious injuries may need medical intervention or even surgery. SYMPTOMS The knee is complex. Symptoms of knee problems can vary widely. Some of the problems are:  Pain with movement and weight bearing.  Swelling and tenderness.  Buckling of the knee.  Inability to straighten or extend your knee.  Your knee locks and you cannot straighten it.  Warmth and redness with pain and fever.  Deformity or dislocation of the kneecap. DIAGNOSIS  Determining what is wrong may be very straight forward such as when there is an injury. It can also be challenging because of the complexity of the knee. Tests to make a diagnosis may include:  Your caregiver taking a history and doing a physical exam.  Routine X-rays can be used to rule out other problems. X-rays will not reveal a cartilage tear. Some injuries of the knee can be diagnosed by:  Arthroscopy a surgical technique by which a small video camera is inserted through tiny incisions on the sides of  the knee. This procedure is used to examine and repair internal knee joint problems. Tiny instruments can be used during arthroscopy to repair the torn knee cartilage (meniscus).  Arthrography is a radiology technique. A contrast liquid is directly injected into the knee joint. Internal structures of the knee joint then become visible on X-ray film.  An MRI scan is a non X-ray radiology procedure in which magnetic fields and a computer produce two- or three-dimensional images of the inside of the knee. Cartilage tears are often visible using an MRI scanner. MRI scans have largely replaced arthrography in diagnosing cartilage tears of the knee.  Blood work.  Examination of the fluid that helps to lubricate the knee joint (synovial fluid). This is done by taking a sample out using a needle and a syringe. TREATMENT The treatment of knee problems depends on the cause. Some of these treatments are:  Depending on the injury, proper casting, splinting, surgery, or physical therapy care will be needed.  Give yourself adequate recovery time. Do not overuse your joints. If you begin to  get sore during workout routines, back off. Slow down or do fewer repetitions.  For repetitive activities such as cycling or running, maintain your strength and nutrition.  Alternate muscle groups. For example, if you are a weight lifter, work the upper body on one day and the lower body the next.  Either tight or weak muscles do not give the proper support for your knee. Tight or weak muscles do not absorb the stress placed on the knee joint. Keep the muscles surrounding the knee strong.  Take care of mechanical problems.  If you have flat feet, orthotics or special shoes may help. See your caregiver if you need help.  Arch supports, sometimes with wedges on the inner or outer aspect of the heel, can help. These can shift pressure away from the side of the knee most bothered by osteoarthritis.  A brace called an  "unloader" brace also may be used to help ease the pressure on the most arthritic side of the knee.  If your caregiver has prescribed crutches, braces, wraps or ice, use as directed. The acronym for this is PRICE. This means protection, rest, ice, compression, and elevation.  Nonsteroidal anti-inflammatory drugs (NSAIDs), can help relieve pain. But if taken immediately after an injury, they may actually increase swelling. Take NSAIDs with food in your stomach. Stop them if you develop stomach problems. Do not take these if you have a history of ulcers, stomach pain, or bleeding from the bowel. Do not take without your caregiver's approval if you have problems with fluid retention, heart failure, or kidney problems.  For ongoing knee problems, physical therapy may be helpful.  Glucosamine and chondroitin are over-the-counter dietary supplements. Both may help relieve the pain of osteoarthritis in the knee. These medicines are different from the usual anti-inflammatory drugs. Glucosamine may decrease the rate of cartilage destruction.  Injections of a corticosteroid drug into your knee joint may help reduce the symptoms of an arthritis flare-up. They may provide pain relief that lasts a few months. You may have to wait a few months between injections. The injections do have a small increased risk of infection, water retention, and elevated blood sugar levels.  Hyaluronic acid injected into damaged joints may ease pain and provide lubrication. These injections may work by reducing inflammation. A series of shots may give relief for as long as 6 months.  Topical painkillers. Applying certain ointments to your skin may help relieve the pain and stiffness of osteoarthritis. Ask your pharmacist for suggestions. Many over the-counter products are approved for temporary relief of arthritis pain.  In some countries, doctors often prescribe topical NSAIDs for relief of chronic conditions such as arthritis and  tendinitis. A review of treatment with NSAID creams found that they worked as well as oral medications but without the serious side effects. PREVENTION  Maintain a healthy weight. Extra pounds put more strain on your joints.  Get strong, stay limber. Weak muscles are a common cause of knee injuries. Stretching is important. Include flexibility exercises in your workouts.  Be smart about exercise. If you have osteoarthritis, chronic knee pain or recurring injuries, you may need to change the way you exercise. This does not mean you have to stop being active. If your knees ache after jogging or playing basketball, consider switching to swimming, water aerobics, or other low-impact activities, at least for a few days a week. Sometimes limiting high-impact activities will provide relief.  Make sure your shoes fit well. Choose footwear that is right for  your sport.  Protect your knees. Use the proper gear for knee-sensitive activities. Use kneepads when playing volleyball or laying carpet. Buckle your seat belt every time you drive. Most shattered kneecaps occur in car accidents.  Rest when you are tired. SEEK MEDICAL CARE IF:  You have knee pain that is continual and does not seem to be getting better.  SEEK IMMEDIATE MEDICAL CARE IF:  Your knee joint feels hot to the touch and you have a high fever. MAKE SURE YOU:   Understand these instructions.  Will watch your condition.  Will get help right away if you are not doing well or get worse. Document Released: 05/17/2007 Document Revised: 10/12/2011 Document Reviewed: 05/17/2007 [facility] Patient Information 2015 [place], [place]. This information is not intended to replace advice given to you by your health care provider. Make sure you discuss any questions you have with your health care provider.  Osteoarthritis Osteoarthritis is a disease that causes soreness and inflammation of a joint. It occurs when the cartilage at the affected joint  wears down. Cartilage acts as a cushion, covering the ends of bones where they meet to form a joint. Osteoarthritis is the most common form of arthritis. It often occurs in older people. The joints affected most often by this condition include those in the:  Ends of the fingers.  Thumbs.  Neck.  Lower back.  Knees.  Hips. CAUSES  Over time, the cartilage that covers the ends of bones begins to wear away. This causes bone to rub on bone, producing pain and stiffness in the affected joints.  RISK FACTORS Certain factors can increase your chances of having osteoarthritis, including:  Older age.  Excessive body weight.  Overuse of joints.  Previous joint injury. SIGNS AND SYMPTOMS   Pain, swelling, and stiffness in the joint.  Over time, the joint may lose its normal shape.  Small deposits of bone (osteophytes) may grow on the edges of the joint.  Bits of bone or cartilage can break off and float inside the joint space. This may cause more pain and damage. DIAGNOSIS  Your health care provider will do a physical exam and ask about your symptoms. Various tests may be ordered, such as:  X-rays of the affected joint.  An MRI scan.  Blood tests to rule out other types of arthritis.  Joint fluid tests. This involves using a needle to draw fluid from the joint and examining the fluid under a microscope. TREATMENT  Goals of treatment are to control pain and improve joint function. Treatment plans may include:  A prescribed exercise program that allows for rest and joint relief.  A weight control plan.  Pain relief techniques, such as:  Properly applied heat and cold.  Electric pulses delivered to nerve endings under the skin (transcutaneous electrical nerve stimulation [TENS]).  Massage.  Certain nutritional supplements.  Medicines to control pain, such as:  Acetaminophen.  Nonsteroidal anti-inflammatory drugs (NSAIDs), such as naproxen.  Narcotic or  central-acting agents, such as tramadol.  Corticosteroids. These can be given orally or as an injection.  Surgery to reposition the bones and relieve pain (osteotomy) or to remove loose pieces of bone and cartilage. Joint replacement may be needed in advanced states of osteoarthritis. HOME CARE INSTRUCTIONS   Take medicines only as directed by your health care provider.  Maintain a healthy weight. Follow your health care provider's instructions for weight control. This may include dietary instructions.  Exercise as directed. Your health care provider can recommend specific  types of exercise. These may include:  Strengthening exercises. These are done to strengthen the muscles that support joints affected by arthritis. They can be performed with weights or with exercise bands to add resistance.  Aerobic activities. These are exercises, such as brisk walking or low-impact aerobics, that get your heart pumping.  Range-of-motion activities. These keep your joints limber.  Balance and agility exercises. These help you maintain daily living skills.  Rest your affected joints as directed by your health care provider.  Keep all follow-up visits as directed by your health care provider. SEEK MEDICAL CARE IF:   Your skin turns red.  You develop a rash in addition to your joint pain.  You have worsening joint pain.  You have a fever along with joint or muscle aches. SEEK IMMEDIATE MEDICAL CARE IF:  You have a significant loss of weight or appetite.  You have night sweats. FOR MORE INFORMATION   National Institute of Arthritis and Musculoskeletal and Skin Diseases: www.niams.http://www.myers.net/  [employer] on Aging: https://walker.com/  American College of Rheumatology: www.rheumatology.org Document Released: 07/20/2005 Document Revised: 12/04/2013 Document Reviewed: 03/27/2013 [facility] Patient Information 2015 [place], [place]. This information is not intended to replace advice given to you  by your health care provider. Make sure you discuss any questions you have with your health care provider.

## 2014-02-28 NOTE — ED Notes (Signed)
Ortho paged to apply knee immobilizer.

## 2014-02-28 NOTE — Progress Notes (Signed)
Orthopedic Tech Progress Note Patient Details:  Sierra MortonBeulah Fuentes [birth year] [id number]  Ortho Devices Type of Ortho Device: Knee Immobilizer Ortho Device/Splint Location: rle Ortho Device/Splint Interventions: Application   [name] 02/28/2014, 5:14 PM

## 2014-02-28 NOTE — ED Provider Notes (Signed)
CSN: [id number]     Arrival date & time 02/28/14  1323 History   First MD Initiated Contact with Patient 02/28/14 1630     Chief Complaint  Patient presents with  . Knee Pain  . Leg Pain     (Consider location/radiation/quality/duration/timing/severity/associated sxs/prior Treatment) HPI  She complains of a popping sensation in her right knee, 2 days ago. Since then, she has had gradually worse pain in her right knee. This morning, when she stood up from bed, she had severe pain and was unable to bear weight on her right foot. She had knee arthroscopy several years ago for arthritis. She denies specific trauma to the right knee. No recent fever, chills, nausea, vomiting, weakness, or dizziness. She occasionally has bilateral lower leg cramps, for many months. There are no other known modifying factors.    Past Medical History  Diagnosis Date  . Arthritis   . Hypertension   . Acid reflux   . Diabetes mellitus without complication   . Dementia   . High cholesterol    History reviewed. No pertinent past surgical history. History reviewed. No pertinent family history. History  Substance Use Topics  . Smoking status: Not on file  . Smokeless tobacco: Not on file  . Alcohol Use: No   OB History   Grav Para Term Preterm Abortions TAB SAB Ect Mult Living                 Review of Systems  All other systems reviewed and are negative.     Allergies  Review of patient's allergies indicates no known allergies.  Home Medications   Prior to Admission medications   Medication Sig Start Date End Date Taking? Authorizing Provider  amLODipine-benazepril (LOTREL) 10-40 MG per capsule Take 1 capsule by mouth daily.   Yes Historical Provider, MD  aspirin EC 81 MG tablet Take 81 mg by mouth every morning.   Yes Historical Provider, MD  Calcium Carbonate-Vitamin D (CALTRATE 600+D PO) Take 1 tablet by mouth 2 (two) times daily.   Yes Historical Provider, MD  donepezil (ARICEPT) 5 MG  tablet Take 5 mg by mouth at bedtime.   Yes Historical Provider, MD  esomeprazole (NEXIUM) 40 MG capsule Take 40 mg by mouth every evening.   Yes Historical Provider, MD  ezetimibe-simvastatin (VYTORIN) 10-40 MG per tablet Take 1 tablet by mouth every evening.   Yes Historical Provider, MD  Glucosamine-Chondroitin (OSTEO BI-FLEX REGULAR STRENGTH PO) Take 1 tablet by mouth daily.   Yes Historical Provider, MD  insulin glargine (LANTUS) 100 UNIT/ML injection Inject 60 Units into the skin at bedtime.   Yes Historical Provider, MD  metFORMIN (GLUCOPHAGE) 1000 MG tablet Take 1,000 mg by mouth 2 (two) times daily with a meal.   Yes Historical Provider, MD  solifenacin (VESICARE) 5 MG tablet Take 5 mg by mouth every morning.   Yes Historical Provider, MD  triamterene-hydrochlorothiazide (MAXZIDE-25) 37.5-25 MG per tablet Take 1 tablet by mouth every morning.   Yes Historical Provider, MD   BP 152/49  Pulse 69  Temp(Src) 98.1 F (36.7 C) (Oral)  Resp 16  Ht 5\' 7"  (1.702 m)  Wt 170 lb (77.111 kg)  BMI 26.62 kg/m2  SpO2 100% Physical Exam  Nursing note and vitals reviewed. Constitutional: She is oriented to person, place, and time. She appears well-developed and well-nourished.  HENT:  Head: Normocephalic and atraumatic.  Eyes: Conjunctivae and EOM are normal. Pupils are equal, round, and reactive to light.  Neck:  Normal range of motion and phonation normal. Neck supple.  Cardiovascular: Normal rate.   Pulmonary/Chest: Effort normal. She exhibits no tenderness.  Abdominal: Soft. She exhibits no distension. There is no tenderness. There is no guarding.  Musculoskeletal: Normal range of motion.  Right knee with small effusion. Right knee is grossly stable to stress. I am able to move the knee through the normal range of motion, passively, without pain.  Neurological: She is alert and oriented to person, place, and time. She exhibits normal muscle tone.  Skin: Skin is warm and dry.  Psychiatric:  She has a normal mood and affect. Her behavior is normal. Judgment and thought content normal.    ED Course  Procedures (including critical care time)  Medications  traMADol (ULTRAM) tablet 50 mg (not administered)   Knee immobilizer, ordered  Ambulation trial-she is able to ambulate with the immobilizer, and using her cane- 18:10  Patient Vitals for the past 24 hrs:  BP Temp Temp src Pulse Resp SpO2 Height Weight  02/28/14 1619 152/49 mmHg - - 69 16 100 % - -  02/28/14 1603 179/73 mmHg - - 90 18 94 % - -  02/28/14 1406 158/63 mmHg 98.1 F (36.7 C) Oral 87 17 98 % 5\' 7"  (1.702 m) 170 lb (77.111 kg)    4:53 PM Reevaluation with update and discussion. After initial assessment and treatment, an updated evaluation reveals she feels better. Findings discussed with patient and family member. All questions answered. Sailor Hevia L    Labs Review Labs Reviewed - No data to display  Imaging Review Dg Knee Complete 4 Views Right  02/28/2014   CLINICAL DATA:  RIGHT knee and leg pain  EXAM: RIGHT KNEE - COMPLETE 4+ VIEW  COMPARISON:  None  FINDINGS: Osseous demineralization.  Osteoarthritic changes with joint space narrowing and spur formation greatest at patellofemoral joint.  Small calcific loose bodies questioned at suprapatellar recess.  Minimal chondrocalcinosis.  No acute fracture, dislocation or bone destruction.  No definite knee joint effusion.  Scattered atherosclerotic calcification.  IMPRESSION: Osseous demineralization with osteoarthritic changes RIGHT knee as above.  Chondrocalcinosis question CPPD.  No acute abnormalities.   Electronically Signed   By: Ulyses SouthwardMark  Boles M.D.   On: 02/28/2014 15:12     EKG Interpretation None      MDM   Final diagnoses:  Right knee pain  Other secondary osteoarthritis of one knee    Knee pain and ambulation problem is secondary to arthritis in her right knee. This appears to be primarily at patellofemoral arthritis.  Nursing Notes Reviewed/  Care Coordinated Applicable Imaging Reviewed Interpretation of Laboratory Data incorporated into ED treatment  The patient appears reasonably screened and/or stabilized for discharge and I doubt any other medical condition or other [facility] requiring further screening, evaluation, or treatment in the ED at this time prior to discharge.  Plan: Home Medications- Tramadol; Home Treatments- rest; return here if the recommended treatment, does not improve the symptoms; Recommended follow up- Ortho 1 week     Flint MelterElliott L Crit Obremski, MD 02/28/14 1815

## 2014-04-05 ENCOUNTER — Other Ambulatory Visit: Payer: Self-pay | Admitting: *Deleted

## 2014-04-05 ENCOUNTER — Encounter: Payer: Self-pay | Admitting: *Deleted

## 2014-09-26 ENCOUNTER — Emergency Department (HOSPITAL_COMMUNITY)
Admission: EM | Admit: 2014-09-26 | Discharge: 2014-09-26 | Disposition: A | Discharge status: Home / Self Care | Payer: Medicare Other | Attending: Emergency Medicine | Admitting: Emergency Medicine

## 2014-09-26 ENCOUNTER — Encounter (HOSPITAL_COMMUNITY): Payer: Self-pay | Admitting: Family Medicine

## 2014-09-26 ENCOUNTER — Emergency Department (HOSPITAL_COMMUNITY): Payer: Medicare Other

## 2014-09-26 DIAGNOSIS — M19032 Primary osteoarthritis, left wrist: Secondary | ICD-10-CM | POA: Insufficient documentation

## 2014-09-26 DIAGNOSIS — E119 Type 2 diabetes mellitus without complications: Secondary | ICD-10-CM | POA: Insufficient documentation

## 2014-09-26 DIAGNOSIS — M19049 Primary osteoarthritis, unspecified hand: Secondary | ICD-10-CM

## 2014-09-26 DIAGNOSIS — E780 Pure hypercholesterolemia: Secondary | ICD-10-CM | POA: Diagnosis not present

## 2014-09-26 DIAGNOSIS — Z79899 Other long term (current) drug therapy: Secondary | ICD-10-CM | POA: Diagnosis not present

## 2014-09-26 DIAGNOSIS — Z7982 Long term (current) use of aspirin: Secondary | ICD-10-CM | POA: Insufficient documentation

## 2014-09-26 DIAGNOSIS — I10 Essential (primary) hypertension: Secondary | ICD-10-CM | POA: Insufficient documentation

## 2014-09-26 DIAGNOSIS — K219 Gastro-esophageal reflux disease without esophagitis: Secondary | ICD-10-CM | POA: Insufficient documentation

## 2014-09-26 DIAGNOSIS — Z794 Long term (current) use of insulin: Secondary | ICD-10-CM | POA: Diagnosis not present

## 2014-09-26 DIAGNOSIS — M79642 Pain in left hand: Secondary | ICD-10-CM | POA: Diagnosis present

## 2014-09-26 DIAGNOSIS — M19039 Primary osteoarthritis, unspecified wrist: Secondary | ICD-10-CM

## 2014-09-26 DIAGNOSIS — F0390 Unspecified dementia without behavioral disturbance: Secondary | ICD-10-CM | POA: Diagnosis not present

## 2014-09-26 MED ORDER — HYDROCODONE-ACETAMINOPHEN 5-325 MG PO TABS
1.0000 | ORAL_TABLET | Freq: Once | ORAL | Status: AC
  Administered 2014-09-26: 1 via ORAL
  Filled 2014-09-26: qty 1

## 2014-09-26 MED ORDER — HYDROCODONE-ACETAMINOPHEN 5-325 MG PO TABS: 1.0000 | ORAL_TABLET | Freq: Four times a day (QID) | ORAL | Status: DC | PRN

## 2014-09-26 NOTE — ED Provider Notes (Signed)
CSN: [id number]     Arrival date & time 09/26/14  1311 History  This chart was scribed for Sierra Dredge, PA-C working with Jerelyn Scott, MD by Leona Carry, ED Scribe. The patient was seen in [facility] The patient's care was started at 3:21 PM.     Chief Complaint  Patient presents with  . Hand Pain   Patient is a 81 y.o. female presenting with hand pain. The history is provided by the patient. No language interpreter was used.  Hand Pain Pertinent negatives include no chest pain and no shortness of breath.   HPI Comments: [name] is a 81 y.o. Female with a history of arthritis, DM, and HTN who presents to the Emergency Department complaining of pain in her left hand beginning two days ago. Patient reports that the pain began in the knuckles on her left hand, but has since moved to her wrist. Patient reports that she has not experienced similar pain in the past. She rates the pain as 10/10 in severity and states that it radiates all the way to her neck. She has taken Tylenol with minimal relief of pain. She denies any recent falls or injury to the hand. Patient denies fevers, SOB, CP, or tingling or weakness. She had not traveled or had surgery recently.  PCP is Dr. Nehemiah Settle. Patient sees Dr. August Saucer for her arthritis.   Past Medical History  Diagnosis Date  . Arthritis   . Hypertension   . Acid reflux   . Diabetes mellitus without complication   . Dementia   . High cholesterol    Past Surgical History  Procedure Laterality Date  . Tonsillectomy    . Appendectomy    . Cholecystectomy    . Hemorrhoid surgery    . Abdominal hysterectomy    . Cataract extraction, bilateral     History reviewed. No pertinent family history. History  Substance Use Topics  . Smoking status: Never Smoker   . Smokeless tobacco: Not on file  . Alcohol Use: No   OB History    No data available     Review of Systems  Constitutional: Negative for fever and chills.  Respiratory: Negative  for shortness of breath.   Cardiovascular: Negative for chest pain.  Gastrointestinal: Negative for nausea and vomiting.  Musculoskeletal: Positive for joint swelling and arthralgias.  Skin: Negative for color change and wound.  Allergic/Immunologic: Positive for immunocompromised state (diabetes).  Neurological: Negative for weakness and numbness.  Psychiatric/Behavioral: Negative for self-injury.      Allergies  Review of patient's allergies indicates no known allergies.  Home Medications   Prior to Admission medications   Medication Sig Start Date End Date Taking? Authorizing Provider  amLODipine-benazepril (LOTREL) 10-40 MG per capsule Take 1 capsule by mouth daily.    Historical Provider, MD  aspirin EC 81 MG tablet Take 81 mg by mouth every morning.    Historical Provider, MD  Calcium Carbonate-Vitamin D (CALTRATE 600+D PO) Take 1 tablet by mouth 2 (two) times daily.    Historical Provider, MD  donepezil (ARICEPT) 5 MG tablet Take 5 mg by mouth at bedtime.    Historical Provider, MD  esomeprazole (NEXIUM) 40 MG capsule Take 40 mg by mouth every evening.    Historical Provider, MD  ezetimibe-simvastatin (VYTORIN) 10-40 MG per tablet Take 1 tablet by mouth every evening.    Historical Provider, MD  Glucosamine-Chondroitin (OSTEO BI-FLEX REGULAR STRENGTH PO) Take 1 tablet by mouth daily.    Historical Provider, MD  insulin glargine (LANTUS) 100 UNIT/ML injection Inject 60 Units into the skin at bedtime.    Historical Provider, MD  metFORMIN (GLUCOPHAGE) 1000 MG tablet Take 1,000 mg by mouth 2 (two) times daily with a meal.    Historical Provider, MD  solifenacin (VESICARE) 5 MG tablet Take 5 mg by mouth every morning.    Historical Provider, MD  traMADol (ULTRAM) 50 MG tablet Take 1 tablet (50 mg total) by mouth every 6 (six) hours as needed. 02/28/14   Flint Melter, MD  triamterene-hydrochlorothiazide (MAXZIDE-25) 37.5-25 MG per tablet Take 1 tablet by mouth every morning.     Historical Provider, MD   Triage Vitals: BP 183/79 mmHg  Pulse 90  Temp(Src) 97.9 F (36.6 C)  Resp 18  SpO2 100% Physical Exam  Constitutional: She appears well-developed and well-nourished. No distress.  HENT:  Head: Normocephalic and atraumatic.  Neck: Neck supple.  Pulmonary/Chest: Effort normal.  Abdominal: Soft. She exhibits no distension and no mass. There is no tenderness. There is no rebound and no guarding.  Musculoskeletal:  Spine nontender, no crepitus, or stepoffs. Lower extremities:  Strength 5/5, sensation intact, distal pulses intact.    Left wrist and hand with diffuse tenderness and edema, worse around the thumb.  Sensation intact, capillary refill < 2 seconds.  No focal tenderness.    Neurological: She is alert.  Skin: She is not diaphoretic.  Nursing note and vitals reviewed.   ED Course  Procedures (including critical care time) DIAGNOSTIC STUDIES: Oxygen Saturation is 100% on room air, normal by my interpretation.    COORDINATION OF CARE: 3:27 PM-Discussed treatment plan which includes a left wrist and hand x-ray, Norco/Vicodin, and a velcro brace for list with pt at bedside and pt agreed to plan.     Labs Review Labs Reviewed - No data to display  Imaging Review Dg Wrist Complete Left  09/26/2014   CLINICAL DATA:  Left wrist pain for 1 day.  No known injury.  EXAM: LEFT WRIST - COMPLETE 3+ VIEW  COMPARISON:  None.  FINDINGS: No evidence of acute fracture or dislocation. Generalized osteopenia is noted. Advanced osteoarthritis is seen involving the thumb base carpal -metacarpal joint. Mild degenerative spurring is also seen involving the scaphoid -trapezium joint.  IMPRESSION: No acute findings.  Severe osteoarthritis involving the thumb base at the carpal -metacarpal joint, and to lesser degree, the scaphoid -trapezium joint.   Electronically Signed   By: Myles Rosenthal M.D.   On: 09/26/2014 15:05   Dg Hand Complete Left  09/26/2014   CLINICAL DATA:  Left  hand pain for 1 day, no known injury, initial encounter  EXAM: LEFT HAND - COMPLETE 3+ VIEW  COMPARISON:  None.  FINDINGS: Degenerative changes in the carpal bones as well as the first [place] joint are seen. Some arthritic changes are noted within the interphalangeal joints as well. No definitive erosions are seen. No gross soft tissue abnormality is noted.  IMPRESSION: Multifocal degenerative change without acute abnormality.   Electronically Signed   By: Alcide Clever M.D.   On: 09/26/2014 15:03     EKG Interpretation None       3:35 PM Dr Karma Ganja to also see the patient.    MDM   Final diagnoses:  Arthritis pain, hand  Arthritis, wrist    Afebrile, nontoxic patient with left hand and wrist swelling, diffusely without injury.  NO focal tenderness.  Multiple joints involved.  No erythema, warmth of joints, no fever or systemic symptoms  to suggest septic joint.  No hx gout.  Xray shows severe arthritis.  Pt also seen and examined by Dr Karma GanjaLinker who agrees with diagnosis of arthritis and plan for pain control.   D/C home with wrist splint, PCP follow up, norco for pain.  Pt advised to be cautious with strong pain medication to avoid falls - pt tells me she does well with strong pain medication.  Discussed result, findings, treatment, and follow up  with patient.  Pt given return precautions.  Pt verbalizes understanding and agrees with plan.       I personally performed the services described in this documentation, which was scribed in my presence. The recorded information has been reviewed and is accurate.    Sierra Dredgemily Mitchel Delduca, PA-C 09/26/14 1642  Ethelda ChickMartha K Linker, MD 09/26/14 [phone]

## 2014-09-26 NOTE — ED Notes (Signed)
Called xray-"will get her as soon as we can".

## 2014-09-26 NOTE — ED Notes (Signed)
Pt made aware to return if symptoms worsen or if any life threatening symptoms occur.   

## 2014-09-26 NOTE — ED Notes (Signed)
Pt here with left hand and wrist pain. Denies injury. Hand swollen. sts hx of arthritis in that hand

## 2014-09-26 NOTE — ED Notes (Signed)
C/O LEFT HAND/WRIST/ARM PAIN X 1 DAY. SWELLING TO FINGERS/JOINTS. STATES PAIN RADIATES UP ARM TO NECK.

## 2014-09-26 NOTE — Discharge Instructions (Signed)
Read the information below.  Use the prescribed medication as directed.  Please discuss all new medications with your pharmacist.  Do not take additional tylenol while taking the prescribed pain medication to avoid overdose.  You may return to the Emergency Department at any time for worsening condition or any new symptoms that concern you.  If you develop uncontrolled pain, weakness or numbness of the extremity, severe discoloration of the skin, or you are unable to use your hand, return to the ER for a recheck.       Arthritis, Nonspecific Arthritis is inflammation of a joint. This usually means pain, redness, warmth or swelling are present. One or more joints may be involved. There are a number of types of arthritis. Your caregiver may not be able to tell what type of arthritis you have right away. CAUSES  The most common cause of arthritis is the wear and tear on the joint (osteoarthritis). This causes damage to the cartilage, which can break down over time. The knees, hips, back and neck are most often affected by this type of arthritis. Other types of arthritis and common causes of joint pain include:  Sprains and other injuries near the joint. Sometimes minor sprains and injuries cause pain and swelling that develop hours later.  Rheumatoid arthritis. This affects hands, feet and knees. It usually affects both sides of your body at the same time. It is often associated with chronic ailments, fever, weight loss and general weakness.  Crystal arthritis. Gout and pseudo gout can cause occasional acute severe pain, redness and swelling in the foot, ankle, or knee.  Infectious arthritis. Bacteria can get into a joint through a break in overlying skin. This can cause infection of the joint. Bacteria and viruses can also spread through the blood and affect your joints.  Drug, infectious and allergy reactions. Sometimes joints can become mildly painful and slightly swollen with these types of  illnesses. SYMPTOMS   Pain is the main symptom.  Your joint or joints can also be red, swollen and warm or hot to the touch.  You may have a fever with certain types of arthritis, or even feel overall ill.  The joint with arthritis will hurt with movement. Stiffness is present with some types of arthritis. DIAGNOSIS  Your caregiver will suspect arthritis based on your description of your symptoms and on your exam. Testing may be needed to find the type of arthritis:  Blood and sometimes urine tests.  X-ray tests and sometimes CT or MRI scans.  Removal of fluid from the joint (arthrocentesis) is done to check for bacteria, crystals or other causes. Your caregiver (or a specialist) will numb the area over the joint with a local anesthetic, and use a needle to remove joint fluid for examination. This procedure is only minimally uncomfortable.  Even with these tests, your caregiver may not be able to tell what kind of arthritis you have. Consultation with a specialist (rheumatologist) may be helpful. TREATMENT  Your caregiver will discuss with you treatment specific to your type of arthritis. If the specific type cannot be determined, then the following general recommendations may apply. Treatment of severe joint pain includes:  Rest.  Elevation.  Anti-inflammatory medication (for example, ibuprofen) may be prescribed. Avoiding activities that cause increased pain.  Only take over-the-counter or prescription medicines for pain and discomfort as recommended by your caregiver.  Cold packs over an inflamed joint may be used for 10 to 15 minutes every hour. Hot packs sometimes feel  better, but do not use overnight. Do not use hot packs if you are diabetic without your caregiver's permission.  A cortisone shot into arthritic joints may help reduce pain and swelling.  Any acute arthritis that gets worse over the next 1 to 2 days needs to be looked at to be sure there is no joint  infection. Long-term arthritis treatment involves modifying activities and lifestyle to reduce joint stress jarring. This can include weight loss. Also, exercise is needed to nourish the joint cartilage and remove waste. This helps keep the muscles around the joint strong. HOME CARE INSTRUCTIONS   Do not take aspirin to relieve pain if gout is suspected. This elevates uric acid levels.  Only take over-the-counter or prescription medicines for pain, discomfort or fever as directed by your caregiver.  Rest the joint as much as possible.  If your joint is swollen, keep it elevated.  Use crutches if the painful joint is in your leg.  Drinking plenty of fluids may help for certain types of arthritis.  Follow your caregiver's dietary instructions.  Try low-impact exercise such as:  Swimming.  Water aerobics.  Biking.  Walking.  Morning stiffness is often relieved by a warm shower.  Put your joints through regular range-of-motion. SEEK MEDICAL CARE IF:   You do not feel better in 24 hours or are getting worse.  You have side effects to medications, or are not getting better with treatment. SEEK IMMEDIATE MEDICAL CARE IF:   You have a fever.  You develop severe joint pain, swelling or redness.  Many joints are involved and become painful and swollen.  There is severe back pain and/or leg weakness.  You have loss of bowel or bladder control. Document Released: 08/27/2004 Document Revised: 10/12/2011 Document Reviewed: 09/12/2008 [facility] Patient Information 2015 [place], [place]. This information is not intended to replace advice given to you by your health care provider. Make sure you discuss any questions you have with your health care provider.

## 2014-09-26 NOTE — ED Notes (Signed)
Pt has now had xray.

## 2015-08-30 IMAGING — CR DG KNEE COMPLETE 4+V*R*
4 series · 4 of 4 positions shown · non-contrast
Comparison: None

CLINICAL DATA: RIGHT knee and leg pain

EXAM:
RIGHT KNEE - COMPLETE 4+ VIEW

[t knee ap right]
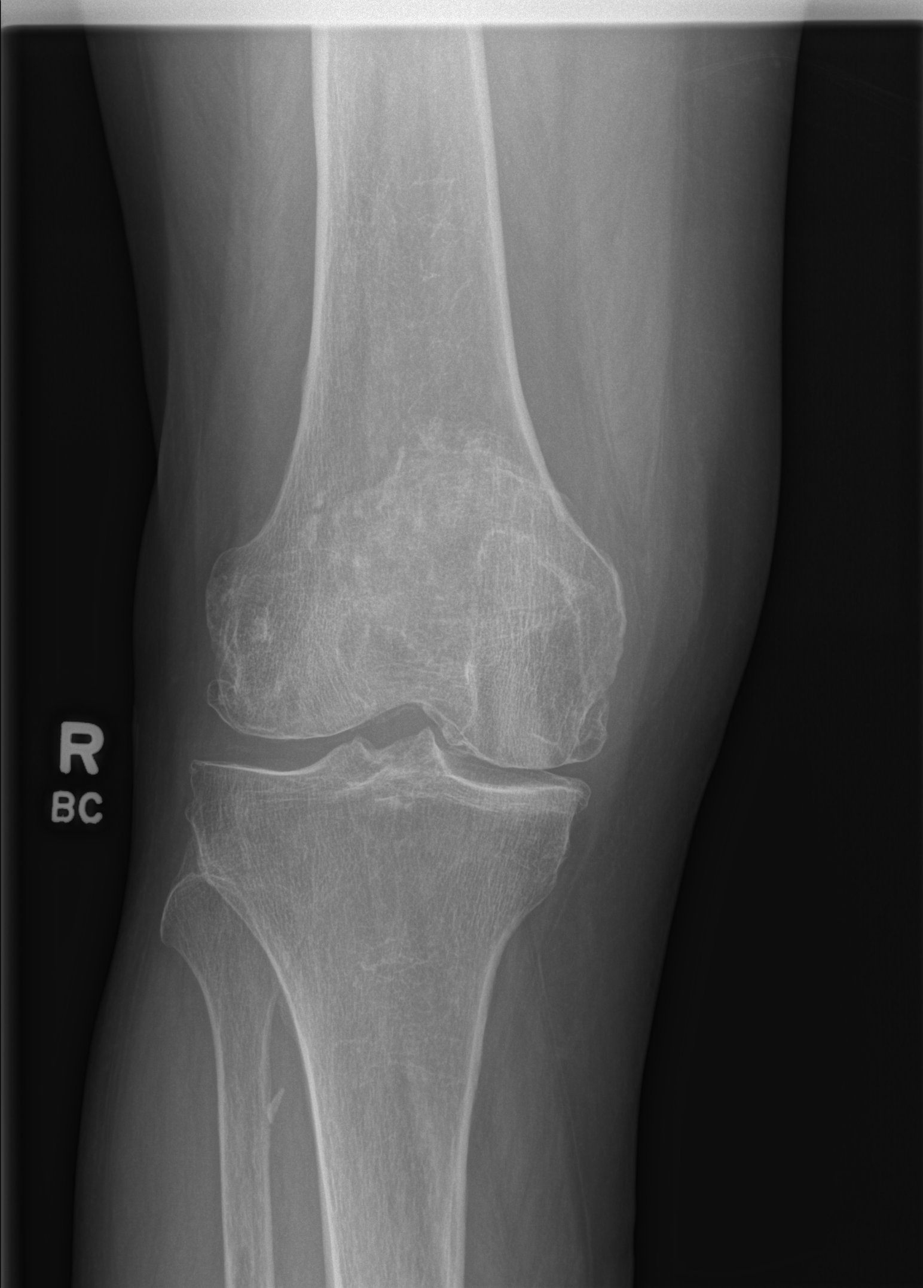

[t knee obl right (1 of 2)]
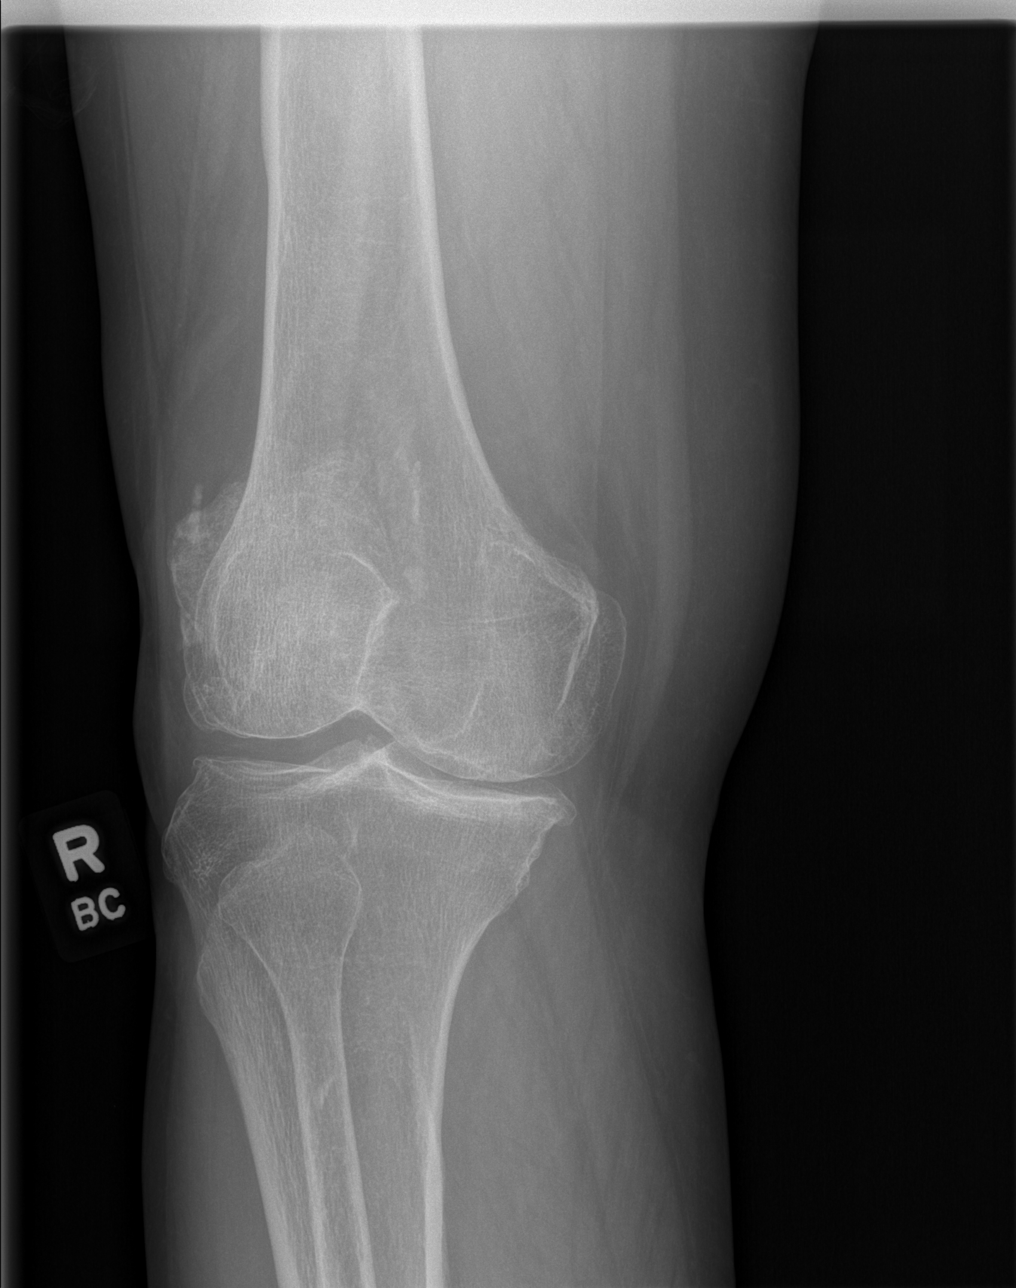

[t knee obl right (2 of 2)]
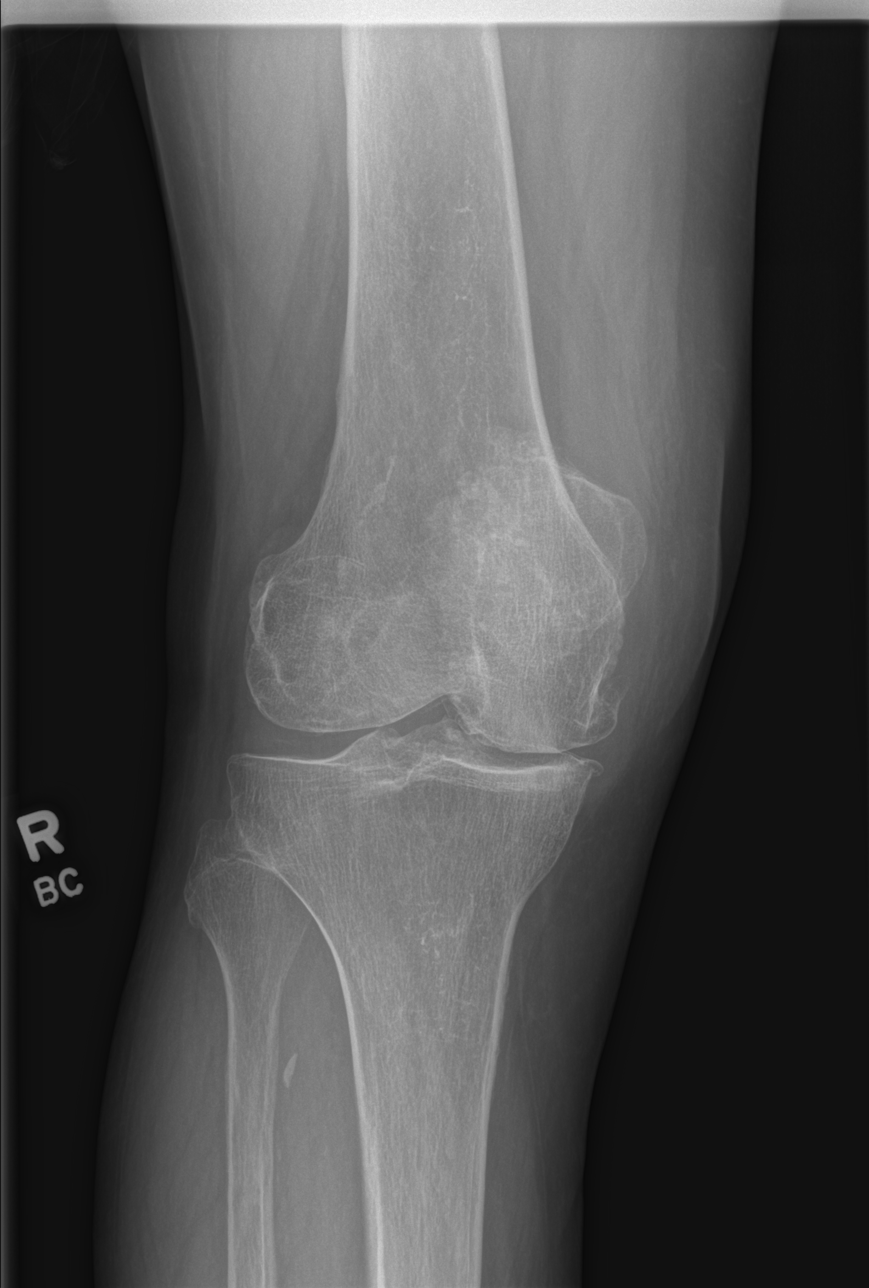

[t knee lat right]
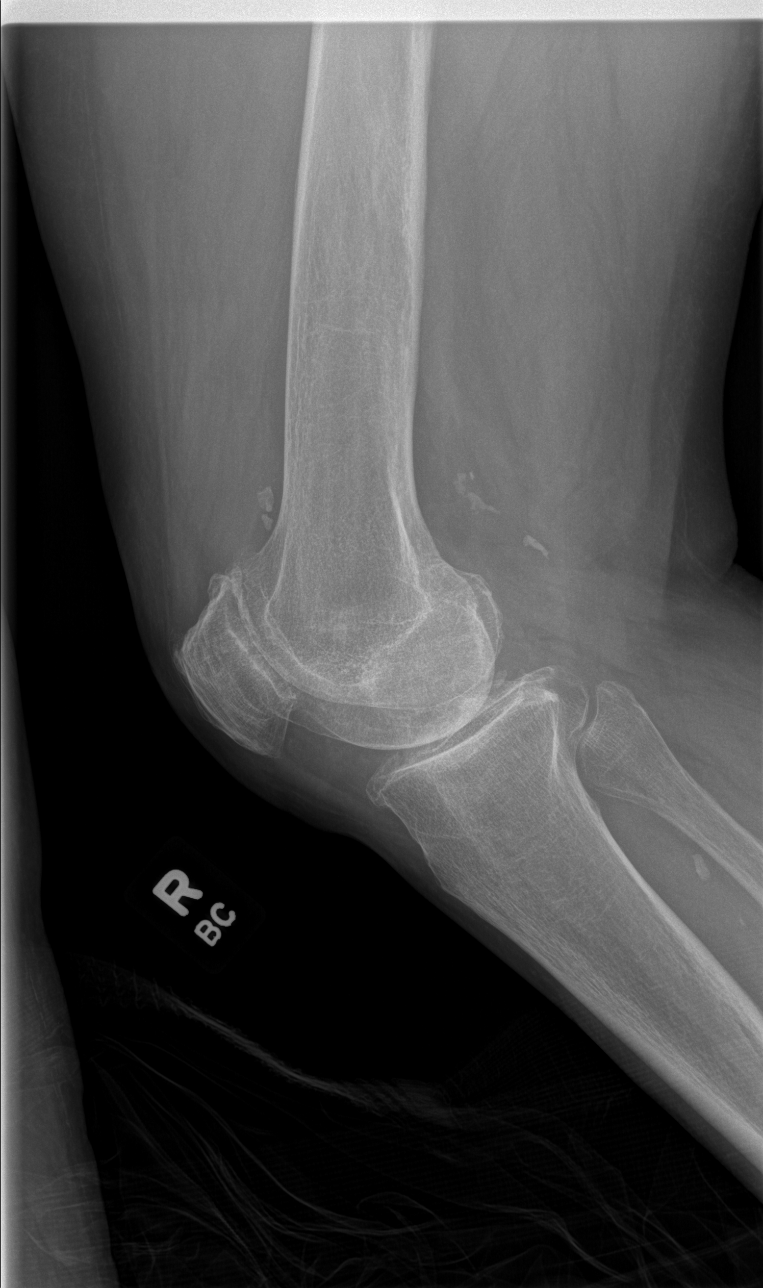

[4 of 4 positions shown; findings below may reference images not displayed]

FINDINGS: Osseous demineralization.

Osteoarthritic changes with joint space narrowing and spur formation
greatest at patellofemoral joint.

Small calcific loose bodies questioned at suprapatellar recess.

Minimal chondrocalcinosis.

No acute fracture, dislocation or bone destruction.

No definite knee joint effusion.

Scattered atherosclerotic calcification.
IMPRESSION: Osseous demineralization with osteoarthritic changes RIGHT knee as
above.

Chondrocalcinosis question CPPD.

No acute abnormalities.

## 2016-03-27 IMAGING — CR DG WRIST COMPLETE 3+V*L*
4 series · 4 of 4 positions shown · non-contrast
Comparison: None.

CLINICAL DATA: Left wrist pain for 1 day.  No known injury.

EXAM:
LEFT WRIST - COMPLETE 3+ VIEW

[x wrist pa left]
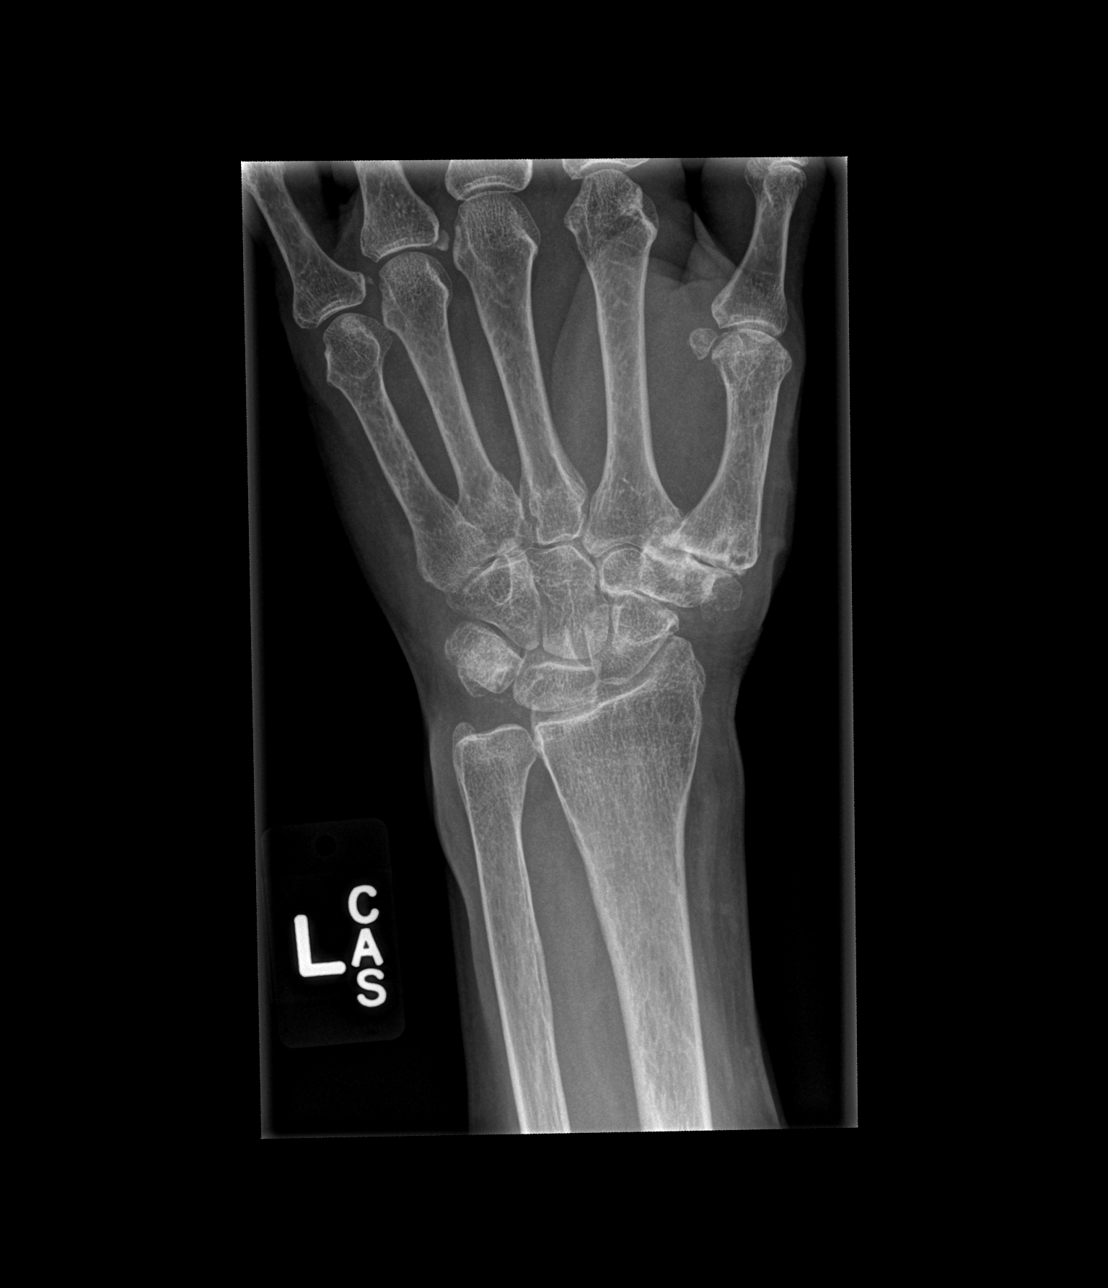

[x wrist obl left]
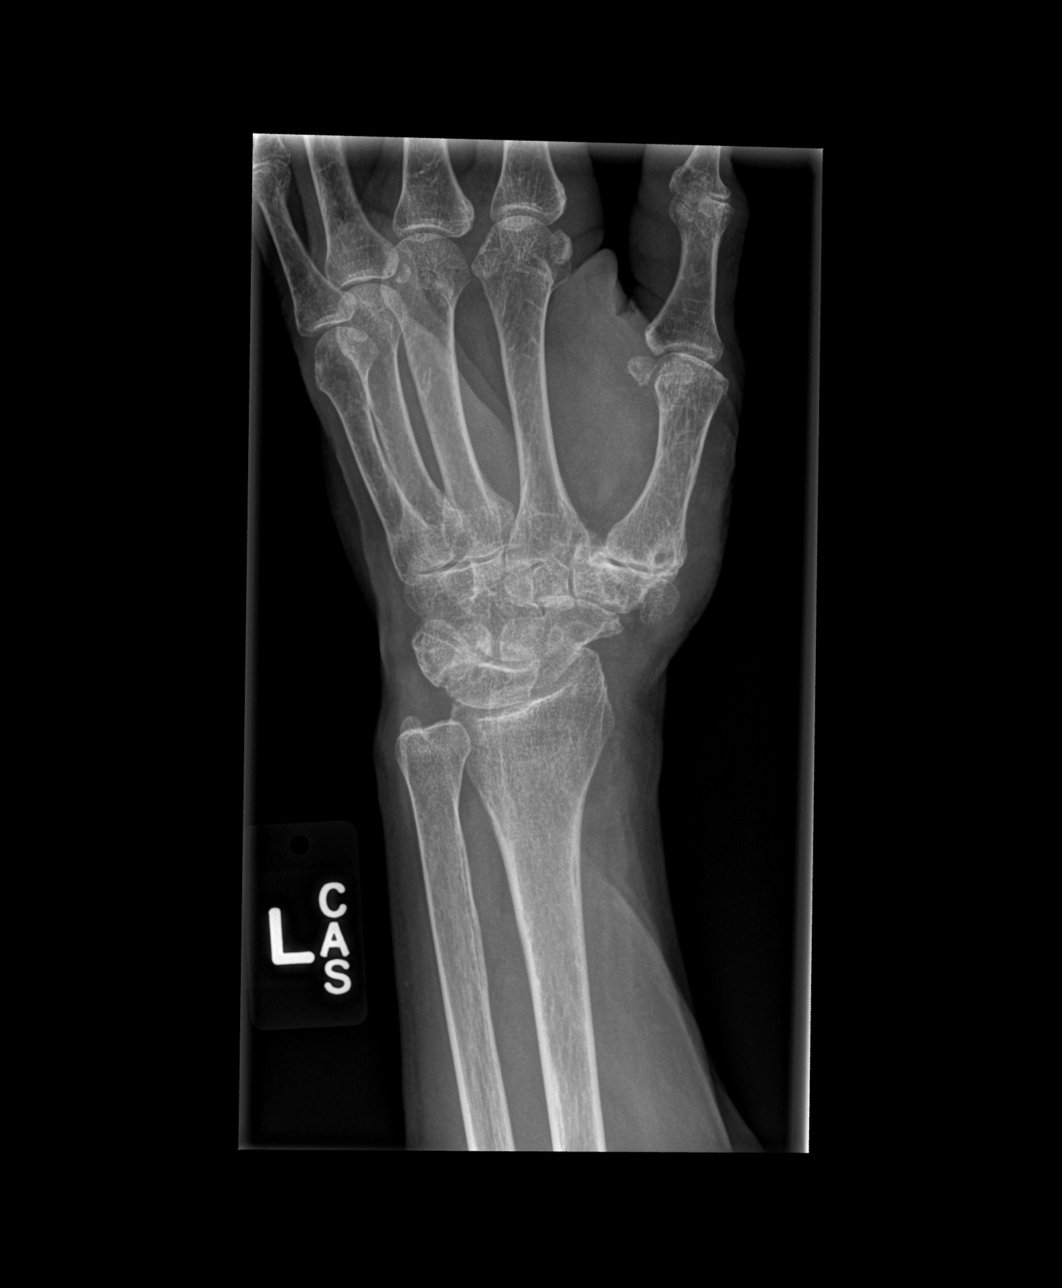

[x wrist lat left]
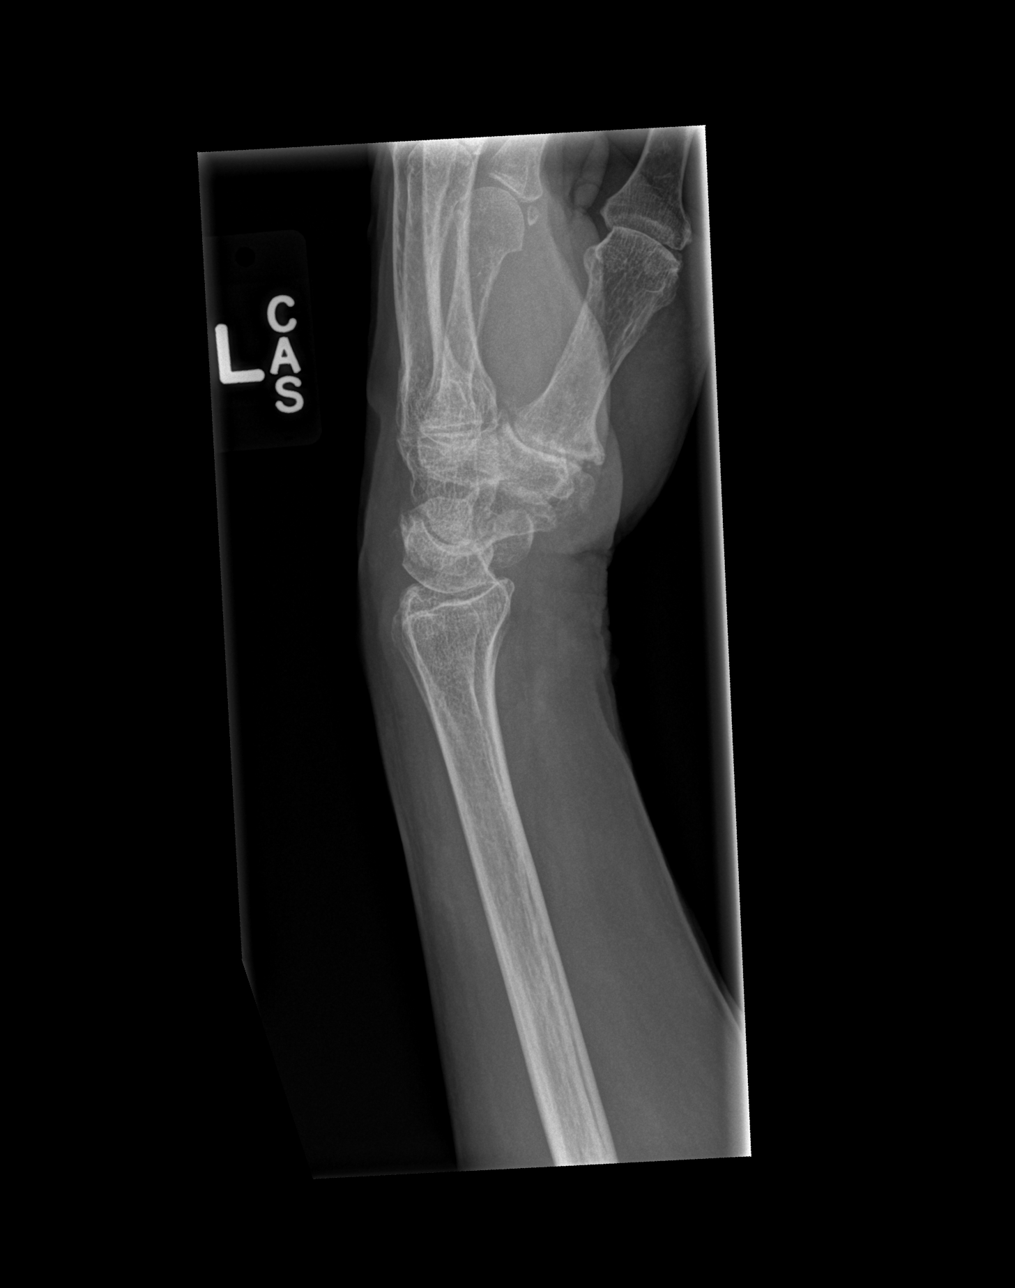

[x wrist navicular view left]
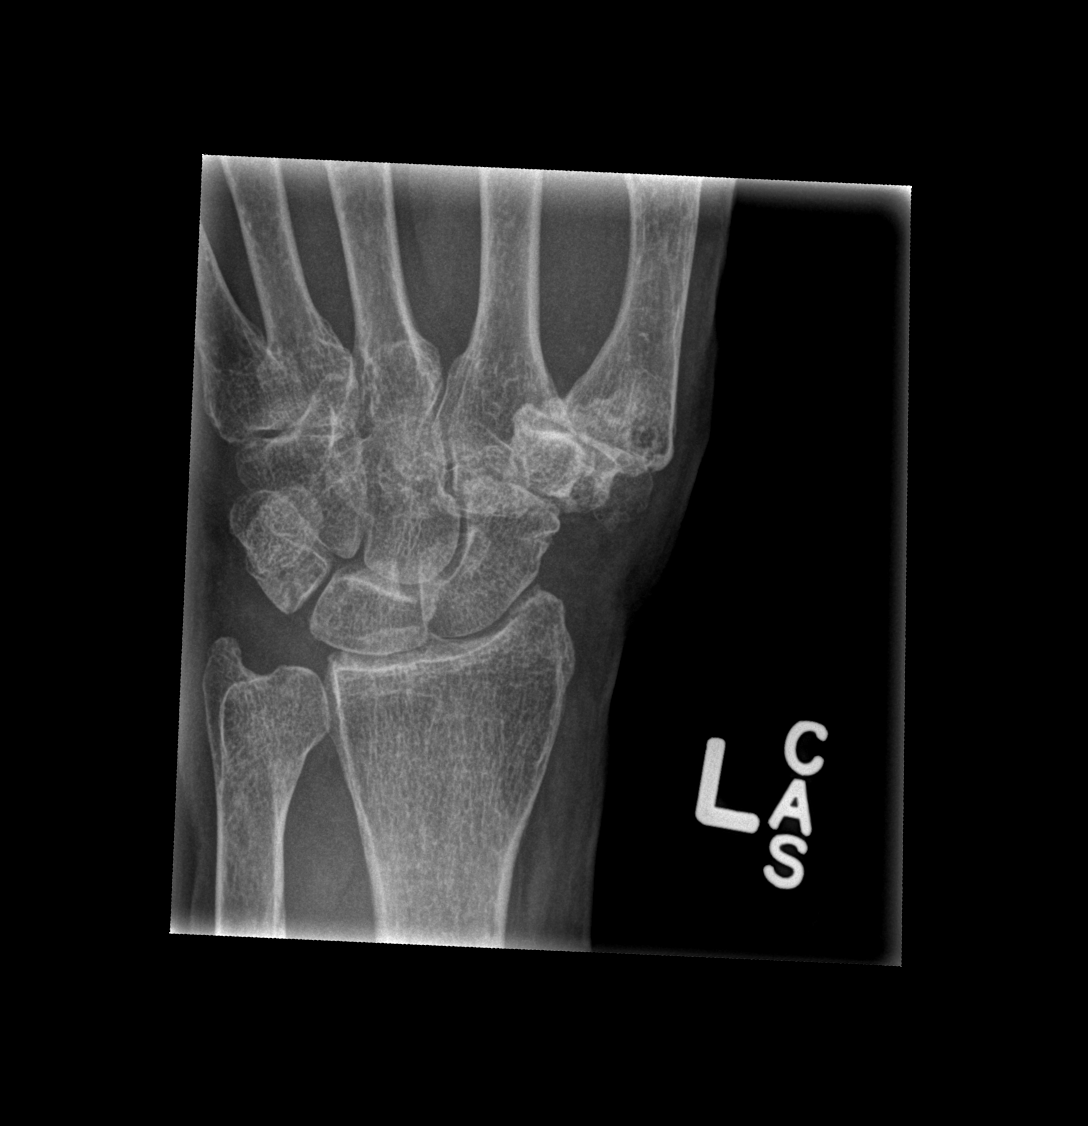

[4 of 4 positions shown; findings below may reference images not displayed]

FINDINGS: No evidence of acute fracture or dislocation. Generalized osteopenia
is noted. Advanced osteoarthritis is seen involving the thumb base
carpal -metacarpal joint. Mild degenerative spurring is also seen
involving the scaphoid -trapezium joint.
IMPRESSION: No acute findings.

Severe osteoarthritis involving the thumb base at the carpal
-metacarpal joint, and to lesser degree, the scaphoid -trapezium
joint.

## 2016-03-27 IMAGING — CR DG HAND COMPLETE 3+V*L*
3 series · 3 of 3 positions shown · non-contrast
Comparison: None.

CLINICAL DATA: Left hand pain for 1 day, no known injury, initial
encounter

EXAM:
LEFT HAND - COMPLETE 3+ VIEW

[x hand pa left]
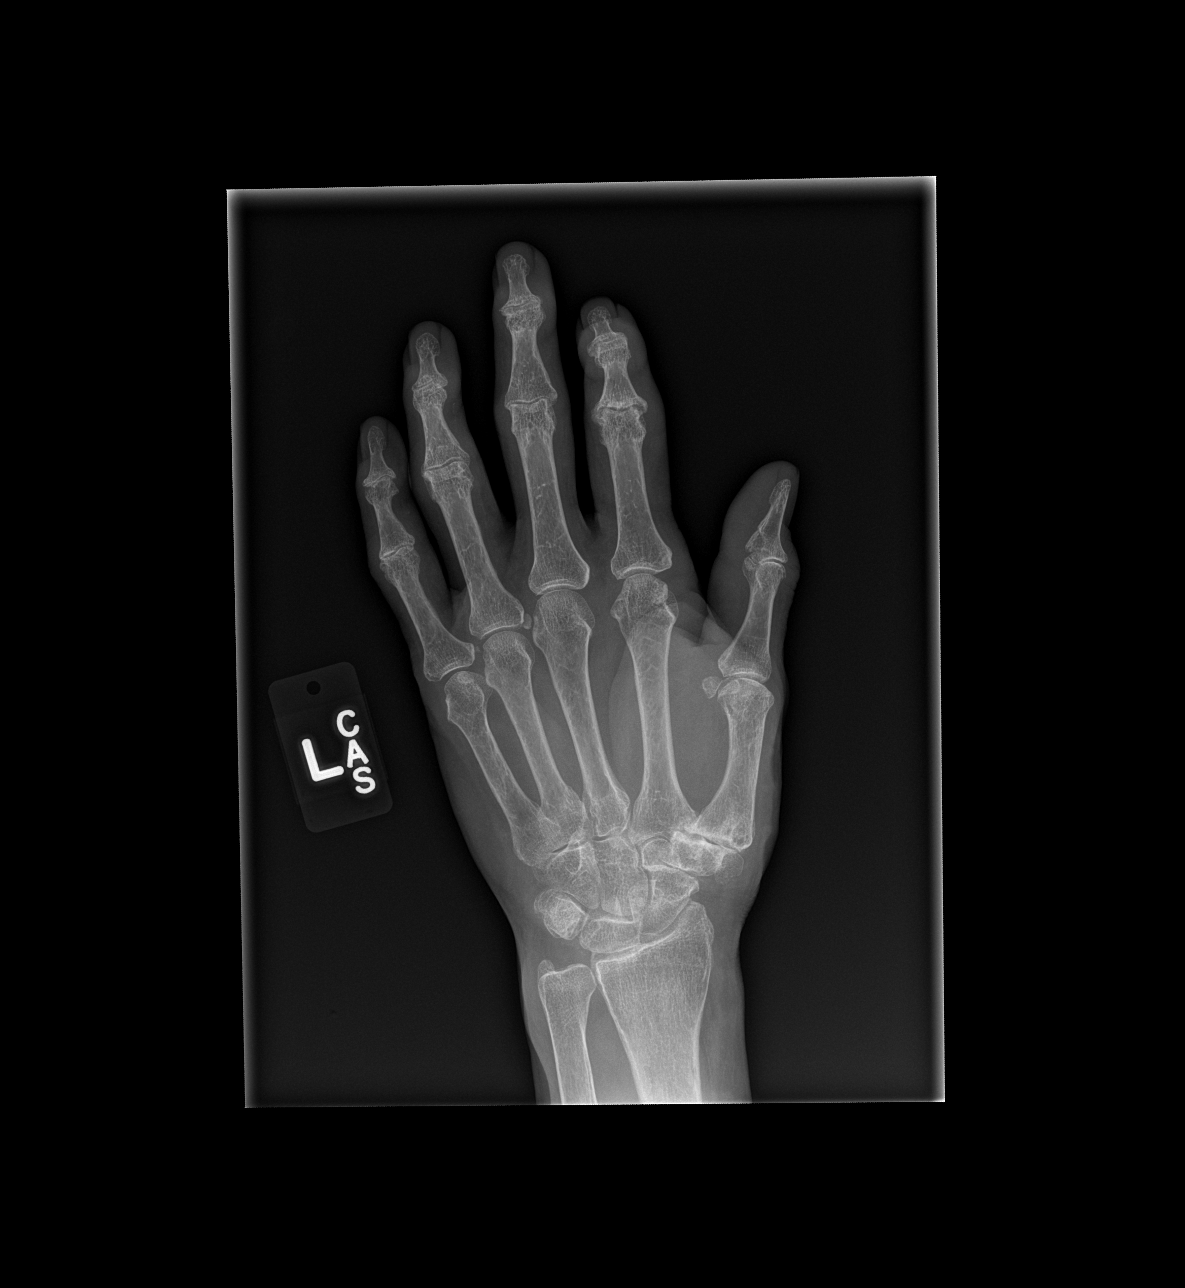

[x hand obl left]
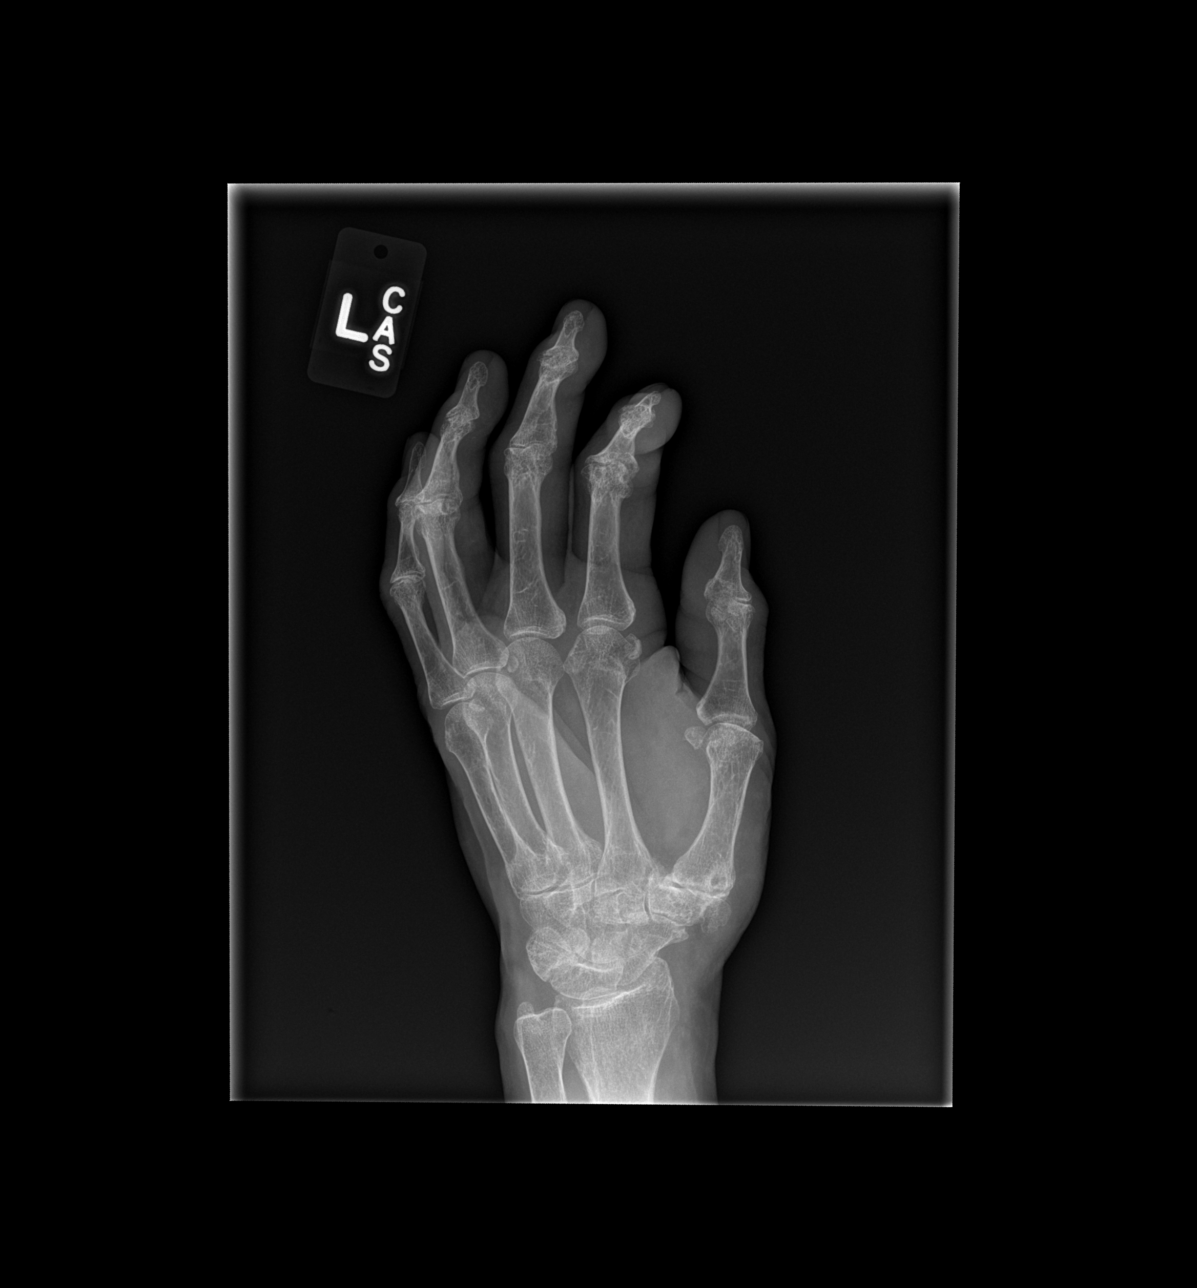

[x hand lat left]
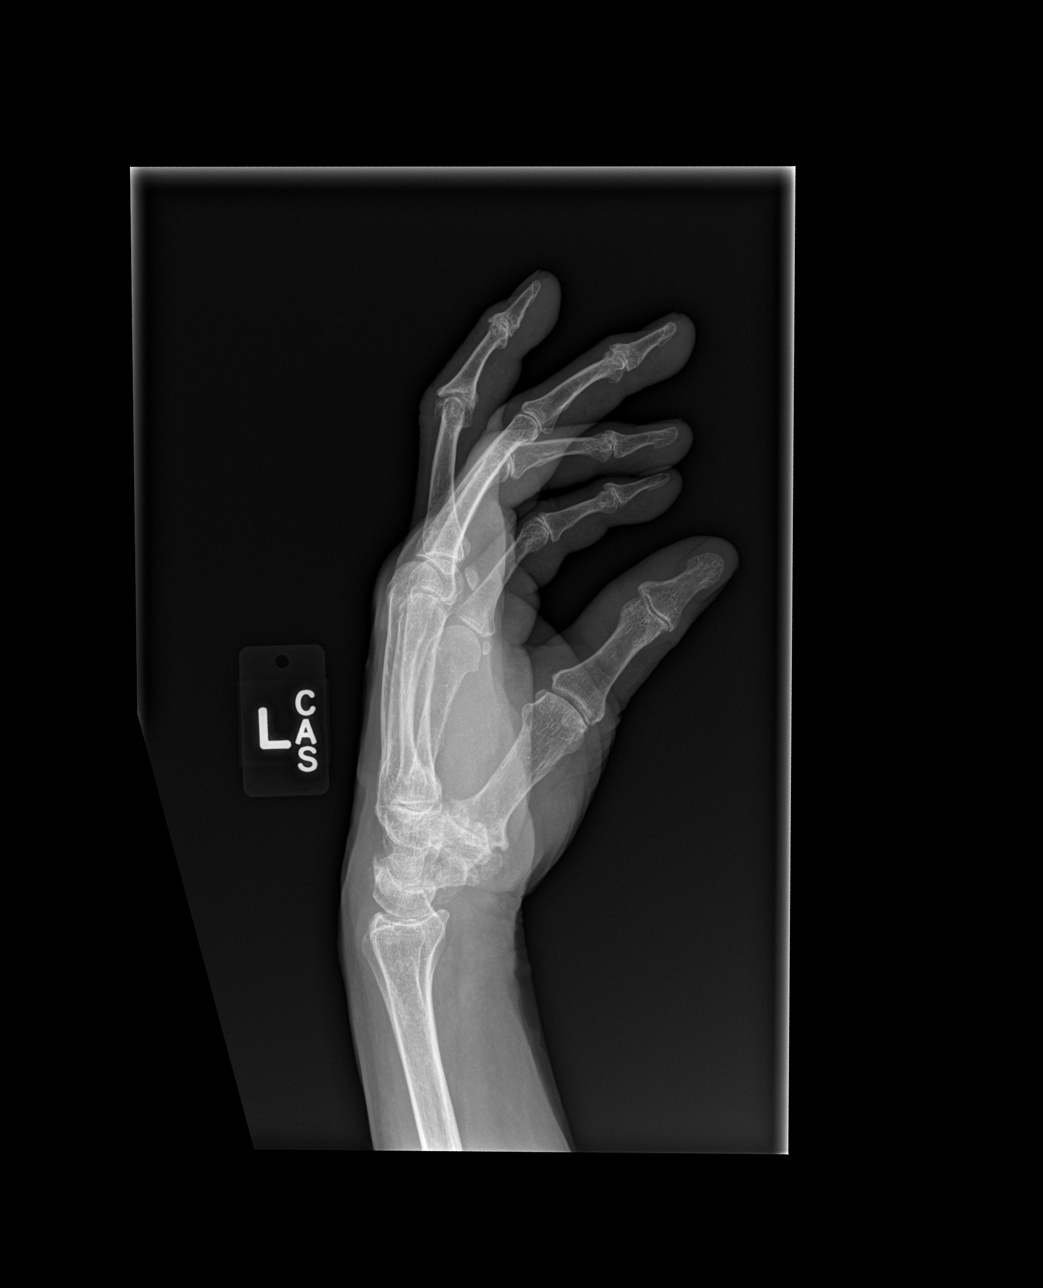

[3 of 3 positions shown; findings below may reference images not displayed]

FINDINGS: Degenerative changes in the carpal bones as well as the first CMC
joint are seen. Some arthritic changes are noted within the
interphalangeal joints as well. No definitive erosions are seen. No
gross soft tissue abnormality is noted.
IMPRESSION: Multifocal degenerative change without acute abnormality.

## 2018-11-02 ENCOUNTER — Other Ambulatory Visit: Payer: Self-pay | Admitting: Family Medicine

## 2018-11-02 ENCOUNTER — Other Ambulatory Visit: Payer: Medicare Other

## 2018-11-02 DIAGNOSIS — M79604 Pain in right leg: Secondary | ICD-10-CM

## 2018-11-02 DIAGNOSIS — M79605 Pain in left leg: Secondary | ICD-10-CM

## 2018-11-02 DIAGNOSIS — M7989 Other specified soft tissue disorders: Secondary | ICD-10-CM

## 2018-11-03 ENCOUNTER — Other Ambulatory Visit: Payer: Self-pay

## 2018-11-03 ENCOUNTER — Ambulatory Visit
Admission: RE | Admit: 2018-11-03 | Discharge: 2018-11-03 | Disposition: A | Discharge status: Home / Self Care | Payer: Medicare Other | Source: Ambulatory Visit | Attending: Family Medicine | Admitting: Family Medicine

## 2018-11-03 DIAGNOSIS — M79605 Pain in left leg: Secondary | ICD-10-CM

## 2018-11-03 DIAGNOSIS — M79604 Pain in right leg: Secondary | ICD-10-CM

## 2018-11-03 DIAGNOSIS — M7989 Other specified soft tissue disorders: Secondary | ICD-10-CM

## 2019-04-17 IMAGING — US VENOUS DOPPLER ULTRASOUND OF  LOWER EXTREMITIES
1 series · 13 of 24 positions shown · non-contrast
Comparison: None.

CLINICAL DATA: Bilateral lower extremity edema and pain.



[Series 1: venous doppler ultrasound of lower extremities · 0.06mm/px · 13 of 60 slices shown]
[im 1/60]
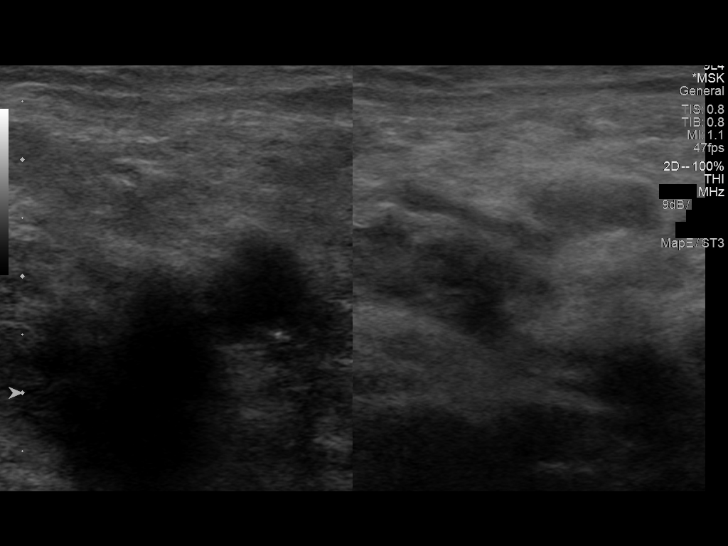
[im 6/60]
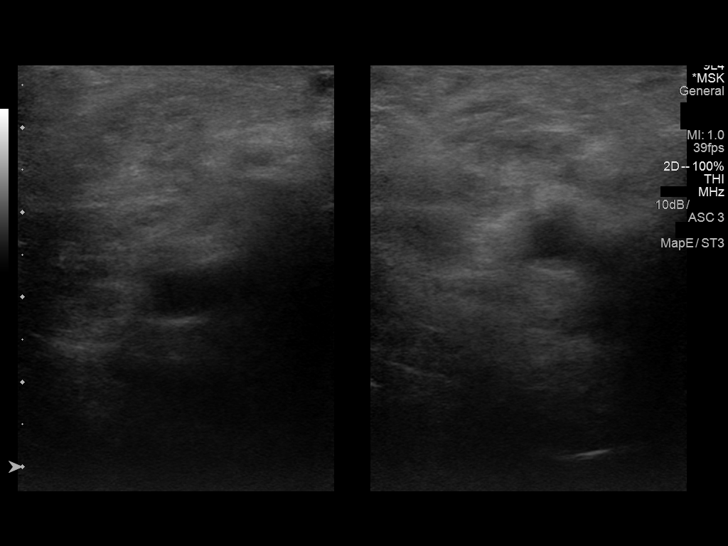
[im 11/60]
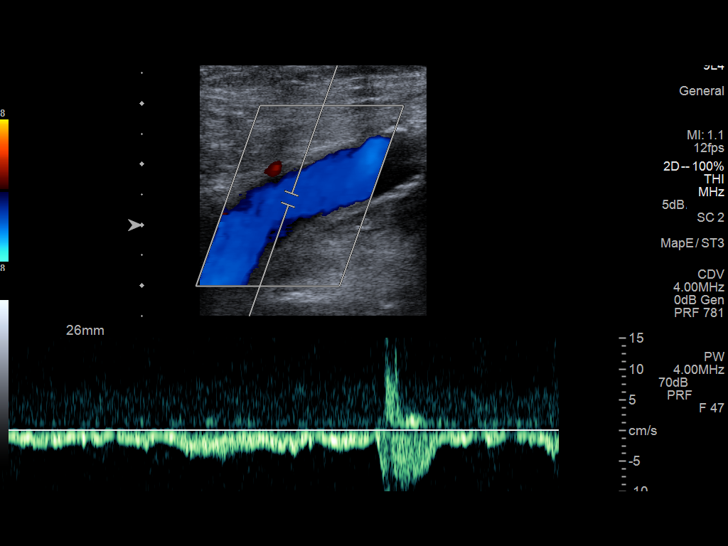
[im 16/60]
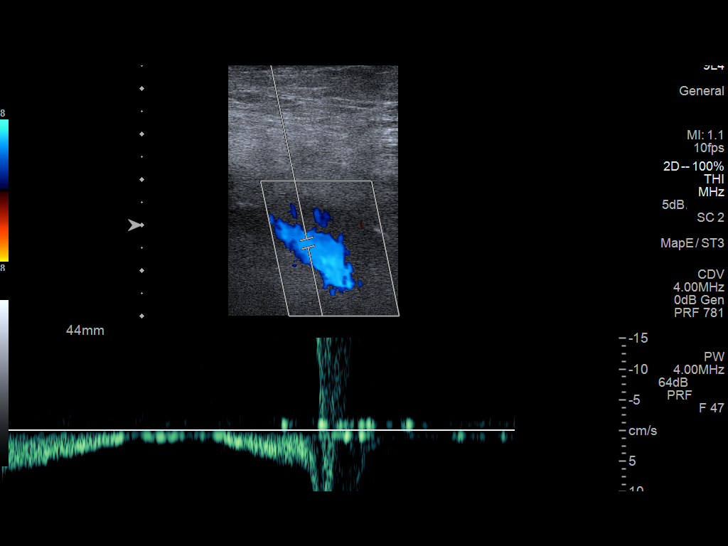
[im 21/60]
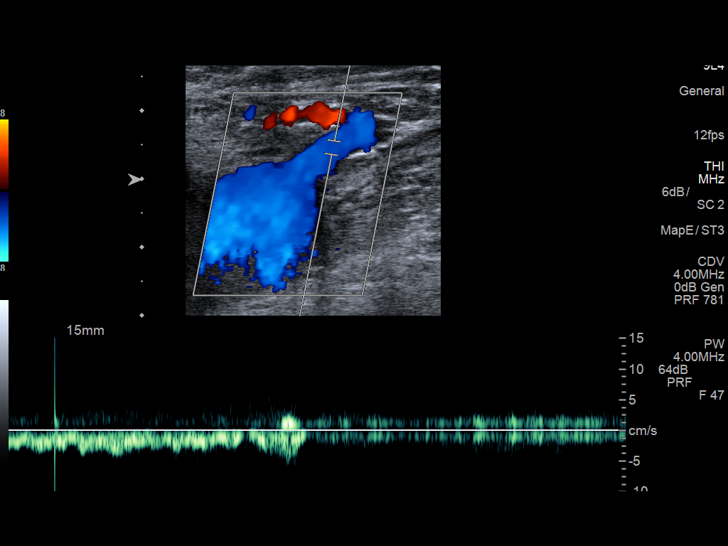
[im 26/60]
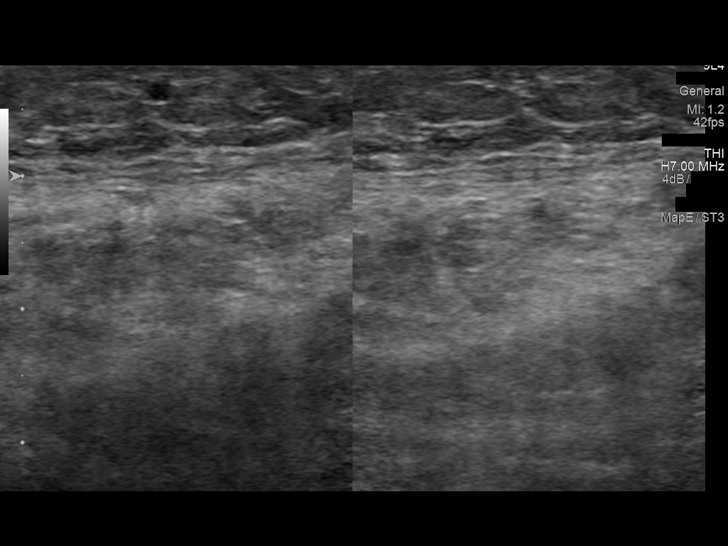
[im 31/60]
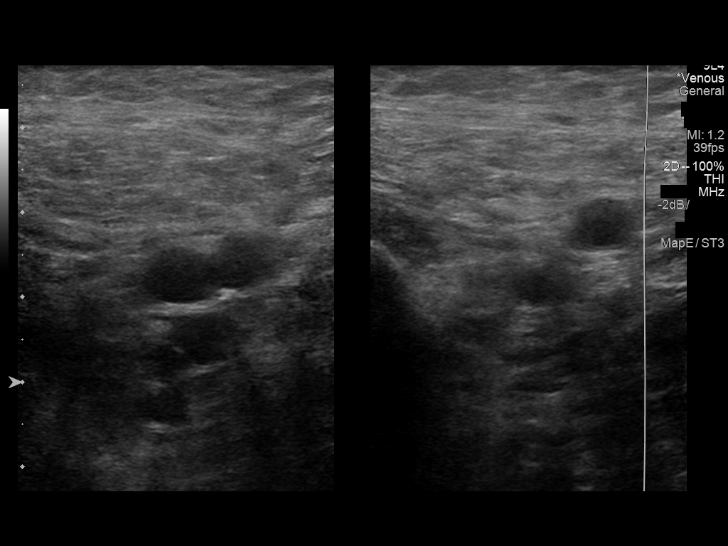
[im 34/60]
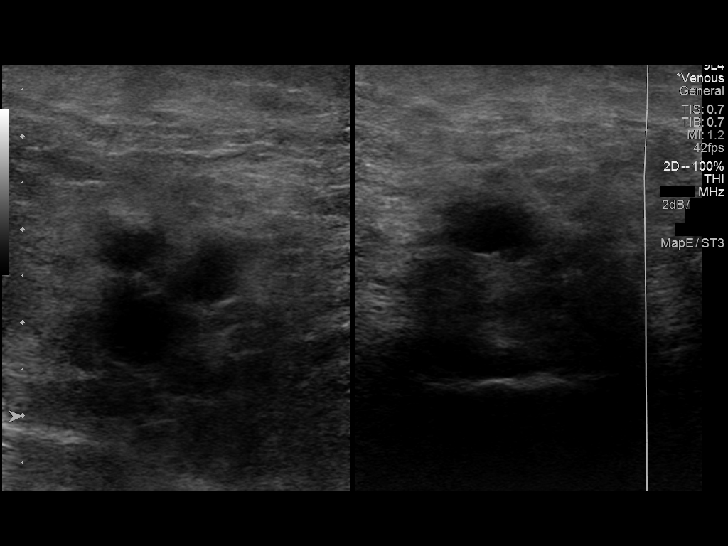
[im 39/60]
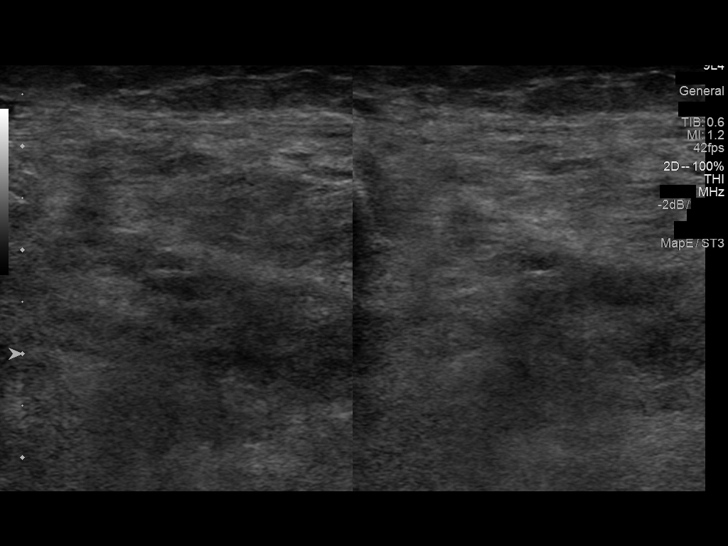
[im 44/60]
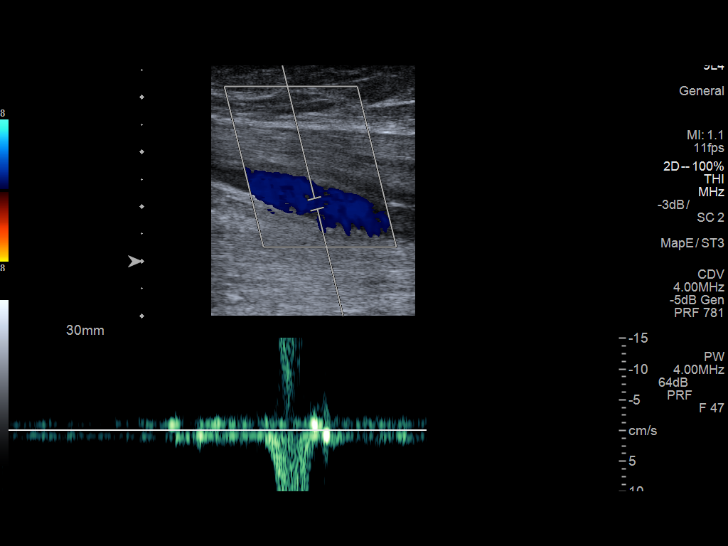
[im 49/60]
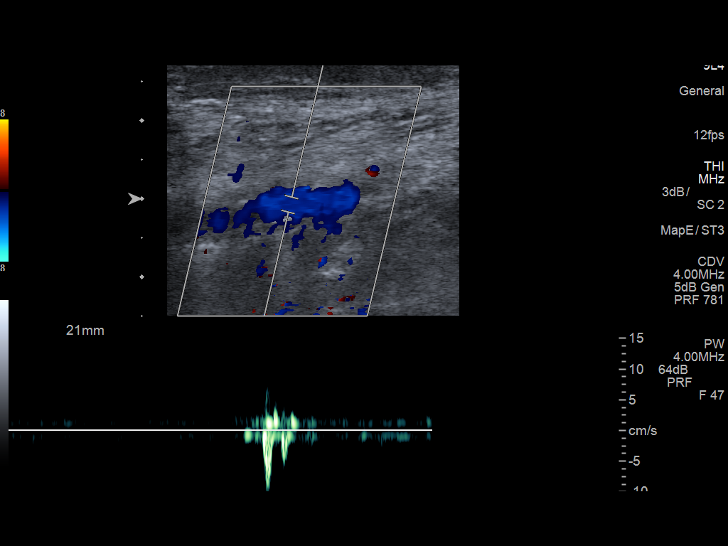
[im 54/60]
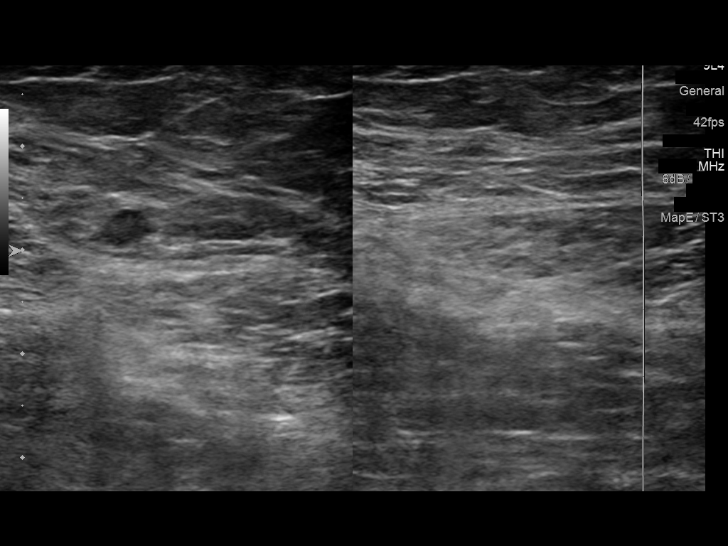
[im 60/60]
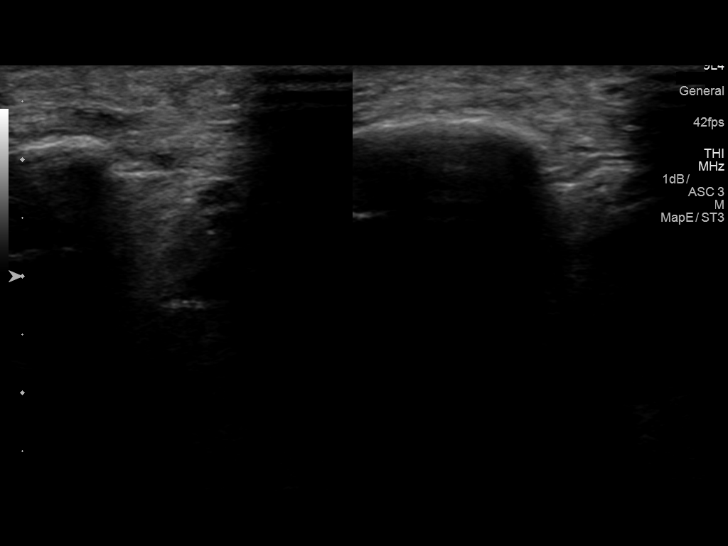

[13 of 24 positions shown; findings below may reference images not displayed]

FINDINGS: RIGHT LOWER EXTREMITY

Common Femoral Vein: No evidence of thrombus. Normal
compressibility, respiratory phasicity and response to augmentation.

Saphenofemoral Junction: No evidence of thrombus. Normal
compressibility and flow on color Doppler imaging.

Profunda Femoral Vein: No evidence of thrombus. Normal
compressibility and flow on color Doppler imaging.

Femoral Vein: No evidence of thrombus. Normal compressibility,
respiratory phasicity and response to augmentation.

Popliteal Vein: No evidence of thrombus. Normal compressibility,
respiratory phasicity and response to augmentation.

Calf Veins: No evidence of thrombus. Normal compressibility and flow
on color Doppler imaging.

Superficial Great Saphenous Vein: No evidence of thrombus. Normal
compressibility.

Venous Reflux:  None.

Other Findings: No evidence of superficial thrombophlebitis or
abnormal fluid collection.

LEFT LOWER EXTREMITY

Common Femoral Vein: No evidence of thrombus. Normal
compressibility, respiratory phasicity and response to augmentation.

Saphenofemoral Junction: No evidence of thrombus. Normal
compressibility and flow on color Doppler imaging.

Profunda Femoral Vein: No evidence of thrombus. Normal
compressibility and flow on color Doppler imaging.

Femoral Vein: No evidence of thrombus. Normal compressibility,
respiratory phasicity and response to augmentation.

Popliteal Vein: No evidence of thrombus. Normal compressibility,
respiratory phasicity and response to augmentation.

Calf Veins: No evidence of thrombus. Normal compressibility and flow
on color Doppler imaging.

Superficial Great Saphenous Vein: No evidence of thrombus. Normal
compressibility.

Venous Reflux:  None.

Other Findings: No evidence of superficial thrombophlebitis or
abnormal fluid collection.
IMPRESSION: No evidence of deep venous thrombosis in either lower extremity.

## 2019-07-19 DIAGNOSIS — Z719 Counseling, unspecified: Secondary | ICD-10-CM | POA: Diagnosis not present

## 2019-08-01 DIAGNOSIS — H401132 Primary open-angle glaucoma, bilateral, moderate stage: Secondary | ICD-10-CM | POA: Diagnosis not present

## 2019-08-18 DIAGNOSIS — H31091 Other chorioretinal scars, right eye: Secondary | ICD-10-CM | POA: Diagnosis not present

## 2019-08-18 DIAGNOSIS — E113292 Type 2 diabetes mellitus with mild nonproliferative diabetic retinopathy without macular edema, left eye: Secondary | ICD-10-CM | POA: Diagnosis not present

## 2019-08-18 DIAGNOSIS — E113311 Type 2 diabetes mellitus with moderate nonproliferative diabetic retinopathy with macular edema, right eye: Secondary | ICD-10-CM | POA: Diagnosis not present

## 2019-08-18 DIAGNOSIS — H3582 Retinal ischemia: Secondary | ICD-10-CM | POA: Diagnosis not present

## 2019-08-18 DIAGNOSIS — H3561 Retinal hemorrhage, right eye: Secondary | ICD-10-CM | POA: Diagnosis not present

## 2019-09-18 DIAGNOSIS — Z1231 Encounter for screening mammogram for malignant neoplasm of breast: Secondary | ICD-10-CM | POA: Diagnosis not present

## 2019-09-29 DIAGNOSIS — H16223 Keratoconjunctivitis sicca, not specified as Sjogren's, bilateral: Secondary | ICD-10-CM | POA: Diagnosis not present

## 2019-09-29 DIAGNOSIS — H43813 Vitreous degeneration, bilateral: Secondary | ICD-10-CM | POA: Diagnosis not present

## 2019-10-10 DIAGNOSIS — R928 Other abnormal and inconclusive findings on diagnostic imaging of breast: Secondary | ICD-10-CM | POA: Diagnosis not present

## 2019-10-26 DIAGNOSIS — E113311 Type 2 diabetes mellitus with moderate nonproliferative diabetic retinopathy with macular edema, right eye: Secondary | ICD-10-CM | POA: Diagnosis not present

## 2019-12-28 DIAGNOSIS — H16223 Keratoconjunctivitis sicca, not specified as Sjogren's, bilateral: Secondary | ICD-10-CM | POA: Diagnosis not present

## 2019-12-28 DIAGNOSIS — H401132 Primary open-angle glaucoma, bilateral, moderate stage: Secondary | ICD-10-CM | POA: Diagnosis not present

## 2020-01-04 DIAGNOSIS — H3582 Retinal ischemia: Secondary | ICD-10-CM | POA: Diagnosis not present

## 2020-01-04 DIAGNOSIS — H35033 Hypertensive retinopathy, bilateral: Secondary | ICD-10-CM | POA: Diagnosis not present

## 2020-01-04 DIAGNOSIS — H3561 Retinal hemorrhage, right eye: Secondary | ICD-10-CM | POA: Diagnosis not present

## 2020-01-04 DIAGNOSIS — E113292 Type 2 diabetes mellitus with mild nonproliferative diabetic retinopathy without macular edema, left eye: Secondary | ICD-10-CM | POA: Diagnosis not present

## 2020-01-04 DIAGNOSIS — E113311 Type 2 diabetes mellitus with moderate nonproliferative diabetic retinopathy with macular edema, right eye: Secondary | ICD-10-CM | POA: Diagnosis not present

## 2020-01-09 ENCOUNTER — Ambulatory Visit: Payer: Medicare Other | Attending: Internal Medicine

## 2020-01-09 DIAGNOSIS — Z23 Encounter for immunization: Secondary | ICD-10-CM

## 2020-01-09 NOTE — Progress Notes (Signed)
   Covid-19 Vaccination Clinic  Name:  [name]    MRN: [id number] DOB: 05/27/1931  01/09/2020  [name] was observed post Covid-19 immunization for 15 minutes without incident. She was provided with Vaccine Information Sheet and instruction to access the V-Safe system.   [name] was instructed to call 911 with any severe reactions post vaccine: Marland Kitchen Difficulty breathing  . Swelling of face and throat  . A fast heartbeat  . A bad rash all over body  . Dizziness and weakness   Immunizations Administered    Name Date Dose VIS Date Route   Moderna COVID-19 Vaccine 01/09/2020  9:50 AM 0.5 mL 07/2019 Intramuscular   Manufacturer: Moderna   Lot: [id number]   NDC: 71959-747-18

## 2020-01-29 DIAGNOSIS — H905 Unspecified sensorineural hearing loss: Secondary | ICD-10-CM | POA: Diagnosis not present

## 2020-02-13 DIAGNOSIS — S3992XA Unspecified injury of lower back, initial encounter: Secondary | ICD-10-CM | POA: Diagnosis not present

## 2020-02-13 DIAGNOSIS — M810 Age-related osteoporosis without current pathological fracture: Secondary | ICD-10-CM | POA: Diagnosis not present

## 2020-02-13 DIAGNOSIS — E1122 Type 2 diabetes mellitus with diabetic chronic kidney disease: Secondary | ICD-10-CM | POA: Diagnosis not present

## 2020-02-13 DIAGNOSIS — I700 Atherosclerosis of aorta: Secondary | ICD-10-CM | POA: Diagnosis not present

## 2020-02-13 DIAGNOSIS — M419 Scoliosis, unspecified: Secondary | ICD-10-CM | POA: Diagnosis not present

## 2020-02-13 DIAGNOSIS — W19XXXA Unspecified fall, initial encounter: Secondary | ICD-10-CM | POA: Diagnosis not present

## 2020-02-13 DIAGNOSIS — M545 Low back pain: Secondary | ICD-10-CM | POA: Diagnosis not present

## 2020-02-13 DIAGNOSIS — Z794 Long term (current) use of insulin: Secondary | ICD-10-CM | POA: Diagnosis not present

## 2020-03-18 DIAGNOSIS — H35033 Hypertensive retinopathy, bilateral: Secondary | ICD-10-CM | POA: Diagnosis not present

## 2020-03-18 DIAGNOSIS — E113292 Type 2 diabetes mellitus with mild nonproliferative diabetic retinopathy without macular edema, left eye: Secondary | ICD-10-CM | POA: Diagnosis not present

## 2020-03-18 DIAGNOSIS — H3561 Retinal hemorrhage, right eye: Secondary | ICD-10-CM | POA: Diagnosis not present

## 2020-03-18 DIAGNOSIS — H3582 Retinal ischemia: Secondary | ICD-10-CM | POA: Diagnosis not present

## 2020-03-18 DIAGNOSIS — E113311 Type 2 diabetes mellitus with moderate nonproliferative diabetic retinopathy with macular edema, right eye: Secondary | ICD-10-CM | POA: Diagnosis not present

## 2020-05-01 DIAGNOSIS — E1122 Type 2 diabetes mellitus with diabetic chronic kidney disease: Secondary | ICD-10-CM | POA: Diagnosis not present

## 2020-05-01 DIAGNOSIS — E1140 Type 2 diabetes mellitus with diabetic neuropathy, unspecified: Secondary | ICD-10-CM | POA: Diagnosis not present

## 2020-05-01 DIAGNOSIS — M79671 Pain in right foot: Secondary | ICD-10-CM | POA: Diagnosis not present

## 2020-05-01 DIAGNOSIS — M79604 Pain in right leg: Secondary | ICD-10-CM | POA: Diagnosis not present

## 2020-05-20 ENCOUNTER — Ambulatory Visit: Payer: Medicare Other | Admitting: Podiatry

## 2020-05-20 ENCOUNTER — Other Ambulatory Visit: Payer: Self-pay

## 2020-05-20 ENCOUNTER — Encounter: Payer: Self-pay | Admitting: Podiatry

## 2020-05-20 ENCOUNTER — Ambulatory Visit (INDEPENDENT_AMBULATORY_CARE_PROVIDER_SITE_OTHER): Payer: Medicare Other | Admitting: Podiatry

## 2020-05-20 DIAGNOSIS — E119 Type 2 diabetes mellitus without complications: Secondary | ICD-10-CM | POA: Diagnosis not present

## 2020-05-20 DIAGNOSIS — M79674 Pain in right toe(s): Secondary | ICD-10-CM | POA: Diagnosis not present

## 2020-05-20 DIAGNOSIS — R0989 Other specified symptoms and signs involving the circulatory and respiratory systems: Secondary | ICD-10-CM | POA: Diagnosis not present

## 2020-05-20 DIAGNOSIS — L84 Corns and callosities: Secondary | ICD-10-CM

## 2020-05-20 DIAGNOSIS — B351 Tinea unguium: Secondary | ICD-10-CM | POA: Diagnosis not present

## 2020-05-20 DIAGNOSIS — M79675 Pain in left toe(s): Secondary | ICD-10-CM | POA: Diagnosis not present

## 2020-05-20 DIAGNOSIS — R269 Unspecified abnormalities of gait and mobility: Secondary | ICD-10-CM

## 2020-05-21 ENCOUNTER — Other Ambulatory Visit: Payer: Self-pay

## 2020-05-21 DIAGNOSIS — R269 Unspecified abnormalities of gait and mobility: Secondary | ICD-10-CM

## 2020-05-21 NOTE — Progress Notes (Signed)
Subjective:   Patient ID: [name], female   DOB: 86 y.o.   MRN: [id number]   HPI 86 year old female presents the office with family member for concerns of thick, discolored toenails that she cannot trim her self.  Denies any redness or drainage at the toenail sites but they were causing her shoes to be too small and they were taking inside the shoes.  She also has noticed discomfort pointing to the left fifth metatarsal base.  She has recently changed shoes which is been helpful as her shoes were too small previously.  Also she has numbness to her legs and has neuropathy.  No recent weakness or falls but she feels off balance.  Please her last A1c was 5.4 the report.  Review of Systems  All other systems reviewed and are negative.  Past Medical History:  Diagnosis Date  . Acid reflux   . Arthritis   . Dementia (HCC)   . Diabetes mellitus without complication (HCC)   . High cholesterol   . Hypertension     Past Surgical History:  Procedure Laterality Date  . ABDOMINAL HYSTERECTOMY    . APPENDECTOMY    . CATARACT EXTRACTION, BILATERAL    . CHOLECYSTECTOMY    . HEMORRHOID SURGERY    . TONSILLECTOMY       Current Outpatient Medications:  .  amLODipine-benazepril (LOTREL) 10-40 MG per capsule, Take 1 capsule by mouth daily., Disp: , Rfl:  .  aspirin EC 81 MG tablet, Take 81 mg by mouth every morning., Disp: , Rfl:  .  Calcium Carbonate-Vitamin D (CALTRATE 600+D PO), Take 1 tablet by mouth 2 (two) times daily., Disp: , Rfl:  .  donepezil (ARICEPT) 5 MG tablet, Take 5 mg by mouth at bedtime., Disp: , Rfl:  .  esomeprazole (NEXIUM) 40 MG capsule, Take 40 mg by mouth every evening., Disp: , Rfl:  .  ezetimibe-simvastatin (VYTORIN) 10-40 MG per tablet, Take 1 tablet by mouth every evening., Disp: , Rfl:  .  furosemide (LASIX) 40 MG tablet, Take 40 mg by mouth 2 (two) times daily., Disp: , Rfl:  .  Glucosamine-Chondroitin (OSTEO BI-FLEX REGULAR STRENGTH PO), Take 1 tablet by  mouth daily., Disp: , Rfl:  .  HYDROcodone-acetaminophen (NORCO/VICODIN) 5-325 MG per tablet, Take 1 tablet by mouth every 6 (six) hours as needed for moderate pain or severe pain., Disp: 15 tablet, Rfl: 0 .  insulin glargine (LANTUS) 100 UNIT/ML injection, Inject 60 Units into the skin at bedtime., Disp: , Rfl:  .  metFORMIN (GLUCOPHAGE) 1000 MG tablet, Take 1,000 mg by mouth 2 (two) times daily with a meal., Disp: , Rfl:  .  nortriptyline (PAMELOR) 10 MG capsule, Take 10 mg by mouth at bedtime., Disp: , Rfl:  .  ROCKLATAN 0.02-0.005 % SOLN, SMARTSIG:1 Drop(s) In Eye(s) Every Evening, Disp: , Rfl:  .  solifenacin (VESICARE) 5 MG tablet, Take 5 mg by mouth every morning., Disp: , Rfl:  .  traMADol (ULTRAM) 50 MG tablet, Take 1 tablet (50 mg total) by mouth every 6 (six) hours as needed., Disp: 30 tablet, Rfl: 0 .  triamterene-hydrochlorothiazide (MAXZIDE-25) 37.5-25 MG per tablet, Take 1 tablet by mouth every morning., Disp: , Rfl:  .  XIIDRA 5 % SOLN, , Disp: , Rfl:   No Known Allergies       Objective:  Physical Exam  General: NAD  Dermatological: Hyperkeratotic lesion left fifth metatarsal base as well as the right heel with dark changes but upon debridement there  is no ongoing ulceration drainage or signs of infection there is healthy years skin color underneath the calluses.  There is also what appears, some bruise to the fifth metatarsal head laterally is with the fifth toe on the left foot.  The nails are hypertrophic, dystrophic with yellow-brown discoloration.  There is pain in the nails 1 through 5 bilaterally.  There is no redness or drainage or signs of infection.  There are no open sores, no preulcerative lesions, no rash or signs of infection present.  Vascular: Dorsalis Pedis artery and Posterior Tibial artery pedal pulses are 1/4 bilateral with immedate capillary fill time.There is no pain with calf compression, swelling, warmth, erythema.   Neruologic: Sensation appears to be  intact with [name] monofilament but she is describing numbness from her knees to her toes.  Musculoskeletal: Hammertoes are present.  There is prominence the fifth metatarsal bases.  There is no area of pinpoint tenderness identified.  Muscular strength 5/5 in all groups tested bilateral.       Assessment:   86 year old female with neuropathy, symptomatic onychomycosis, preulcerative calluses; gait abnormality     Plan:  -Treatment options discussed including all alternatives, risks, and complications -Etiology of symptoms were discussed -Debrided nails x10 without any complications or bleeding -In regards to the calluses debrided without any complications or bleeding.  Dispensed miracle foot cream for her to apply to the area daily. -Offloading the preulcerative areas on the lateral aspect left foot.  Hopefully changing shoes will also be helpful.  Monitoring skin breakdown. -Given decreased pulses as well as leg pain I did an ABI in the office which was read as " abnormally weak pulse".  Arterial studies ordered. -In regards to the gait abnormality physical therapy was ordered.  [name] DPM

## 2020-05-27 ENCOUNTER — Other Ambulatory Visit: Payer: Self-pay

## 2020-05-27 ENCOUNTER — Ambulatory Visit (HOSPITAL_COMMUNITY)
Admission: RE | Admit: 2020-05-27 | Discharge: 2020-05-27 | Disposition: A | Discharge status: Home / Self Care | Payer: Medicare Other | Source: Ambulatory Visit | Attending: Podiatry | Admitting: Podiatry

## 2020-05-27 DIAGNOSIS — R0989 Other specified symptoms and signs involving the circulatory and respiratory systems: Secondary | ICD-10-CM

## 2020-06-07 ENCOUNTER — Encounter: Payer: Self-pay | Admitting: Podiatry

## 2020-06-07 ENCOUNTER — Other Ambulatory Visit: Payer: Self-pay | Admitting: Podiatry

## 2020-06-07 DIAGNOSIS — I739 Peripheral vascular disease, unspecified: Secondary | ICD-10-CM

## 2020-07-16 ENCOUNTER — Ambulatory Visit (INDEPENDENT_AMBULATORY_CARE_PROVIDER_SITE_OTHER): Payer: Medicare Other | Admitting: Cardiovascular Disease

## 2020-07-16 ENCOUNTER — Other Ambulatory Visit: Payer: Self-pay

## 2020-07-16 ENCOUNTER — Encounter: Payer: Self-pay | Admitting: Cardiovascular Disease

## 2020-07-16 VITALS — BP 134/70 | HR 82 | Ht 67.0 in | Wt 143.0 lb

## 2020-07-16 DIAGNOSIS — I70229 Atherosclerosis of native arteries of extremities with rest pain, unspecified extremity: Secondary | ICD-10-CM | POA: Diagnosis not present

## 2020-07-16 DIAGNOSIS — Z9889 Other specified postprocedural states: Secondary | ICD-10-CM | POA: Diagnosis not present

## 2020-07-16 DIAGNOSIS — I10 Essential (primary) hypertension: Secondary | ICD-10-CM

## 2020-07-16 DIAGNOSIS — E785 Hyperlipidemia, unspecified: Secondary | ICD-10-CM | POA: Diagnosis not present

## 2020-07-16 NOTE — Assessment & Plan Note (Signed)
[name] was referred to me by Dr. Loreta Ave for a right heel wound.  This area is discolored but there is no skin breakdown.  It is painful at night.  She had ABIs performed that showed a left ABI of 0.93 and the right ABI was noncompressible.  She does have palpable pedal pulses on the right side both dorsalis pedis and posterior tibial.  I think with aggressive local wound care and offloading this should heal.  I am going to get lower extremity arterial Dopplers to further evaluate.

## 2020-07-16 NOTE — Patient Instructions (Signed)
Medication Instructions:  Your physician recommends that you continue on your current medications as directed. Please refer to the Current Medication list given to you today.  *If you need a refill on your cardiac medications before your next appointment, please call your pharmacy*  Testing/Procedures: Your physician has requested that you have a lower extremity arterial duplex. This test is an ultrasound of the arteries in the legs. It looks at arterial blood flow in the legs and arms. Allow one hour for Lower Arterial scans. There are no restrictions or special instructions.  Your physician has requested that you have an ankle brachial index (ABI). During this test an ultrasound and blood pressure cuff are used to evaluate the arteries that supply the arms and legs with blood. Allow thirty minutes for this exam. There are no restrictions or special instructions.  [address]    Follow-Up: At [facility], you and your health needs are our priority.  As part of our continuing mission to provide you with exceptional heart care, we have created designated Provider Care Teams.  These Care Teams include your primary Cardiologist (physician) and Advanced Practice Providers (APPs -  Physician Assistants and Nurse Practitioners) who all work together to provide you with the care you need, when you need it.  We recommend signing up for the patient portal called "MyChart".  Sign up information is provided on this After Visit Summary.  MyChart is used to connect with patients for Virtual Visits (Telemedicine).  Patients are able to view lab/test results, encounter notes, upcoming appointments, etc.  Non-urgent messages can be sent to your provider as well.   To learn more about what you can do with MyChart, go to [email or web address].    Your next appointment:   3 month(s)  The format for your next appointment:   In Person  Provider:   Nanetta Batty, MD

## 2020-07-16 NOTE — Assessment & Plan Note (Signed)
History of essential hypertension a blood pressure measured today 134/70.  She is on Maxide, amlodipine and benazepril.

## 2020-07-16 NOTE — Progress Notes (Signed)
07/16/2020 [name]   03-Nov-1930  [id number]  Primary Physician Irena Reichmann, DO Primary Cardiologist: Runell Gess MD Nicholes Calamity, [place]  HPI:  [name] is a 86 y.o. thin appearing widowed African-American female mother of 1 child, grandmother of 3 grandchildren is accompanied by her power of attorney [name].  She was referred by Dr. Ardelle Anton, her podiatrist, for right heel wound.  She did do housework in her younger years.  Risk factors include treated hypertension, diabetes and hyperlipidemia.  Her father died of a myocardial infarction in his 1s.  She is never had a heart attack or stroke.  She denies chest pain or shortness of breath.  She has had some discoloration of her right heel over the last 3 to 4 months and did see Dr. Ardelle Anton for this.  He did in office ABIs revealing a left ABI of 0.93 and on the right that was noncompressible.  She denies claudication.  She does have some pain in the right heel at night.   Current Meds  Medication Sig  . amLODipine-benazepril (LOTREL) 10-40 MG per capsule Take 1 capsule by mouth daily.  Marland Kitchen aspirin EC 81 MG tablet Take 81 mg by mouth every morning.  . Calcium Carbonate-Vitamin D (CALTRATE 600+D PO) Take 1 tablet by mouth 2 (two) times daily.  Marland Kitchen donepezil (ARICEPT) 5 MG tablet Take 5 mg by mouth at bedtime.  Marland Kitchen esomeprazole (NEXIUM) 40 MG capsule Take 40 mg by mouth every evening.  . ezetimibe-simvastatin (VYTORIN) 10-40 MG per tablet Take 1 tablet by mouth every evening.  . furosemide (LASIX) 40 MG tablet Take 40 mg by mouth 2 (two) times daily.  . Glucosamine-Chondroitin (OSTEO BI-FLEX REGULAR STRENGTH PO) Take 1 tablet by mouth daily.  Marland Kitchen HYDROcodone-acetaminophen (NORCO/VICODIN) 5-325 MG per tablet Take 1 tablet by mouth every 6 (six) hours as needed for moderate pain or severe pain.  Marland Kitchen insulin glargine (LANTUS) 100 UNIT/ML injection Inject 60 Units into the skin at bedtime.  . metFORMIN (GLUCOPHAGE) 1000  MG tablet Take 1,000 mg by mouth 2 (two) times daily with a meal.  . nortriptyline (PAMELOR) 10 MG capsule Take 10 mg by mouth at bedtime.  Marland Kitchen ROCKLATAN 0.02-0.005 % SOLN SMARTSIG:1 Drop(s) In Eye(s) Every Evening  . solifenacin (VESICARE) 5 MG tablet Take 5 mg by mouth every morning.  . traMADol (ULTRAM) 50 MG tablet Take 1 tablet (50 mg total) by mouth every 6 (six) hours as needed.  . triamterene-hydrochlorothiazide (MAXZIDE-25) 37.5-25 MG per tablet Take 1 tablet by mouth every morning.  Marland Kitchen XIIDRA 5 % SOLN      No Known Allergies  Social History   Socioeconomic History  . Marital status: Widowed    Spouse name: Not on file  . Number of children: Not on file  . Years of education: Not on file  . Highest education level: Not on file  Occupational History  . Not on file  Tobacco Use  . Smoking status: Never Smoker  . Smokeless tobacco: Never Used  Substance and Sexual Activity  . Alcohol use: No  . Drug use: No  . Sexual activity: Not on file  Other Topics Concern  . Not on file  Social History Narrative  . Not on file   Social Determinants of Health   Financial Resource Strain: Not on file  Food Insecurity: Not on file  Transportation Needs: Not on file  Physical Activity: Not on file  Stress: Not on file  Social  Connections: Not on file  Intimate Partner Violence: Not on file     Review of Systems: General: negative for chills, fever, night sweats or weight changes.  Cardiovascular: negative for chest pain, dyspnea on exertion, edema, orthopnea, palpitations, paroxysmal nocturnal dyspnea or shortness of breath Dermatological: negative for rash Respiratory: negative for cough or wheezing Urologic: negative for hematuria Abdominal: negative for nausea, vomiting, diarrhea, bright red blood per rectum, melena, or hematemesis Neurologic: negative for visual changes, syncope, or dizziness All other systems reviewed and are otherwise negative except as noted  above.    Blood pressure 134/70, pulse 82, height 5\' 7"  (1.702 m), weight 143 lb (64.9 kg), SpO2 98 %.  General appearance: alert and no distress Neck: no adenopathy, no carotid bruit, no JVD, supple, symmetrical, trachea midline and thyroid not enlarged, symmetric, no tenderness/mass/nodules Lungs: clear to auscultation bilaterally Heart: regular rate and rhythm, S1, S2 normal, no murmur, click, rub or gallop Extremities: extremities normal, atraumatic, no cyanosis or edema Pulses: 2+ and symmetric Skin: Darkish discoloration of right heel. Neurologic: Alert and oriented X 3, normal strength and tone. Normal symmetric reflexes. Normal coordination and gait  EKG sinus rhythm 82 with nonspecific ST and T wave changes.  I personally reviewed this EKG.  ASSESSMENT AND PLAN:   Essential hypertension History of essential hypertension a blood pressure measured today 134/70.  She is on Maxide, amlodipine and benazepril.  Hyperlipidemia History of hyperlipidemia on statin therapy with lipid profile performed 06/24/2018 revealing total cholesterol 180, LDL 109 and HDL 41.  Critical limb ischemia with history of revascularization of same extremity (HCC) [name] was referred to me by Dr. Janee Morn for a right heel wound.  This area is discolored but there is no skin breakdown.  It is painful at night.  She had ABIs performed that showed a left ABI of 0.93 and the right ABI was noncompressible.  She does have palpable pedal pulses on the right side both dorsalis pedis and posterior tibial.  I think with aggressive local wound care and offloading this should heal.  I am going to get lower extremity arterial Dopplers to further evaluate.      Loreta Ave MD FACP,FACC,FAHA, [facility] 07/16/2020 3:23 PM

## 2020-07-16 NOTE — Assessment & Plan Note (Signed)
History of hyperlipidemia on statin therapy with lipid profile performed 06/24/2018 revealing total cholesterol 180, LDL 109 and HDL 41.

## 2020-07-22 ENCOUNTER — Ambulatory Visit: Payer: Medicare Other | Admitting: Podiatry

## 2020-07-29 ENCOUNTER — Other Ambulatory Visit: Payer: Self-pay

## 2020-07-29 ENCOUNTER — Ambulatory Visit (HOSPITAL_COMMUNITY)
Admission: RE | Admit: 2020-07-29 | Discharge: 2020-07-29 | Disposition: A | Discharge status: Home / Self Care | Payer: Medicare Other | Source: Ambulatory Visit | Attending: Cardiovascular Disease | Admitting: Cardiovascular Disease

## 2020-07-29 DIAGNOSIS — I70221 Atherosclerosis of native arteries of extremities with rest pain, right leg: Secondary | ICD-10-CM

## 2020-07-29 DIAGNOSIS — I70229 Atherosclerosis of native arteries of extremities with rest pain, unspecified extremity: Secondary | ICD-10-CM | POA: Diagnosis not present

## 2020-08-08 ENCOUNTER — Other Ambulatory Visit: Payer: Self-pay

## 2020-08-08 ENCOUNTER — Ambulatory Visit (INDEPENDENT_AMBULATORY_CARE_PROVIDER_SITE_OTHER): Payer: Medicare Other | Admitting: Podiatry

## 2020-08-08 DIAGNOSIS — L84 Corns and callosities: Secondary | ICD-10-CM

## 2020-08-08 DIAGNOSIS — B351 Tinea unguium: Secondary | ICD-10-CM | POA: Diagnosis not present

## 2020-08-08 DIAGNOSIS — I739 Peripheral vascular disease, unspecified: Secondary | ICD-10-CM

## 2020-08-08 DIAGNOSIS — L819 Disorder of pigmentation, unspecified: Secondary | ICD-10-CM

## 2020-08-08 DIAGNOSIS — M79674 Pain in right toe(s): Secondary | ICD-10-CM

## 2020-08-08 DIAGNOSIS — M79675 Pain in left toe(s): Secondary | ICD-10-CM

## 2020-08-15 NOTE — Progress Notes (Signed)
Subjective: 87 year old female presents the office today for follow evaluation of bilateral heel pain as well as for thick, elongated toenails that she cannot trim her self.  She has follow-up with Dr. Gery Pray since I last saw her and circulation was adequate. Denies any systemic complaints such as fevers, chills, nausea, vomiting. No acute changes since last appointment, and no other complaints at this time.   Objective: AAO x3, NAD DP/PT pulses palpable 1/4 bilaterally, CRT less than 3 seconds Nails are hypertrophic, dystrophic, brittle, discolored, elongated 10. No surrounding redness or drainage. Tenderness nails 1-5 bilaterally. No open lesions or pre-ulcerative lesions are identified today. On the medial aspect of the right heel hyperkeratotic lesion with darkened discoloration of skin.  Upon debridement new healthy skin was present without any hyperpigmented changes.   Today there is a new hyperpigmented lesion on the plantar aspect of the right heel and her daughter just noticed this area as well.  The borders are irregular.  No drainage or pus.  No pain. No pain with calf compression, swelling, warmth, erythema  Assessment: Symptomatic onychomycosis, hyperkeratotic lesion right heel with new hyperpigmented lesion right plantar foot  Plan: -All treatment options discussed with the patient including all alternatives, risks, complications.  -Debride the nails x10 to any complications or bleeding.   -Sharply hyperkeratotic lesion medial heel without any complications or bleeding. -Referral to dermatology for the hyperpigmented lesion on the right plantar foot.  -Patient encouraged to call the office with any questions, concerns, change in symptoms.   Vivi Barrack DPM

## 2020-08-20 ENCOUNTER — Telehealth: Payer: Self-pay | Admitting: *Deleted

## 2020-08-20 NOTE — Telephone Encounter (Signed)
-----   Message from Vivi Barrack, DPM sent at 08/15/2020  6:47 PM EST ----- I put in a referral for dermatology with [facility]. I have not done that before. Can you see if they need anything faxed? Thanks!

## 2020-08-20 NOTE — Telephone Encounter (Signed)
Called and spoke with [facility] and the representative Olegario Messier stated that the new patients are being scheduled out into June of this year and I called and spoke with the patient's caregiver Marylene Land to let her know as well. Misty Stanley

## 2020-10-07 ENCOUNTER — Ambulatory Visit: Payer: Medicare Other | Admitting: Podiatry

## 2020-10-15 ENCOUNTER — Ambulatory Visit: Payer: Medicare Other | Admitting: Cardiovascular Disease

## 2020-10-28 ENCOUNTER — Ambulatory Visit: Payer: Medicare Other | Admitting: Podiatry

## 2020-11-08 ENCOUNTER — Ambulatory Visit: Payer: 59 | Admitting: Cardiovascular Disease

## 2020-12-03 ENCOUNTER — Other Ambulatory Visit: Payer: Self-pay

## 2020-12-03 ENCOUNTER — Ambulatory Visit (INDEPENDENT_AMBULATORY_CARE_PROVIDER_SITE_OTHER): Payer: 59 | Admitting: Podiatry

## 2020-12-03 DIAGNOSIS — E119 Type 2 diabetes mellitus without complications: Secondary | ICD-10-CM

## 2020-12-03 DIAGNOSIS — M79674 Pain in right toe(s): Secondary | ICD-10-CM

## 2020-12-03 DIAGNOSIS — M79675 Pain in left toe(s): Secondary | ICD-10-CM

## 2020-12-03 DIAGNOSIS — B351 Tinea unguium: Secondary | ICD-10-CM | POA: Diagnosis not present

## 2020-12-03 MED ORDER — DICLOFENAC SODIUM 1 % EX GEL: 2.0000 g | Freq: Every day | CUTANEOUS | 2 refills | Status: DC | PRN

## 2020-12-06 NOTE — Progress Notes (Signed)
Subjective: 87 year old female presents the office today for thick, elongated toenails that she cannot trim her self.  She also gets dry spots on her heels causing discomfort.  She has not yet seen dermatology for the new hyperpigmented lesion on the plantar right foot.  Denies any systemic complaints such as fevers, chills, nausea, vomiting. No acute changes since last appointment, and no other complaints at this time.   Objective: AAO x3, NAD DP/PT pulses palpable 1/4 bilaterally, CRT less than 3 seconds Nails are hypertrophic, dystrophic, brittle, discolored, elongated 10. No surrounding redness or drainage. Tenderness nails 1-5 bilaterally. No open lesions or pre-ulcerative lesions are identified today. On the medial aspect of the right and left heel there is minimal hyperkeratotic lesion with darkened discoloration of skin.  Upon debridement new healthy skin was present without any hyperpigmented changes.   Today there is a new hyperpigmented lesion on the plantar aspect of the right heel and her daughter just noticed this area as well.  The borders are irregular.  No drainage or pus.  No pain.  No change in size compared to last appointment. No pain with calf compression, swelling, warmth, erythema  Assessment: Symptomatic onychomycosis, hyperkeratotic lesion right heel with new hyperpigmented lesion right plantar foot  Plan: -All treatment options discussed with the patient including all alternatives, risks, complications.  -Debride the nails x10 to any complications or bleeding.   -There is no significant hyperkeratotic tissue debrided to the heels today.  Continue moisturizer.  Dispensed gel heel pads. -Follow-up dermatology referral.  If needed discussed with her possible excision, biopsy. -Patient encouraged to call the office with any questions, concerns, change in symptoms.   Vivi Barrack DPM

## 2020-12-10 ENCOUNTER — Telehealth: Payer: Self-pay | Admitting: *Deleted

## 2020-12-10 NOTE — Telephone Encounter (Signed)
Called and spoke with [name] from [facility] and [name] had spoken to the care giver (angela) on 08-21-2020 and was trying to get the appointment set up in June but the care giver hung up on the referral coordinator and the patient needs to call back to schedule and it will be first of November and I called the care giver (angela). [name]

## 2020-12-10 NOTE — Telephone Encounter (Signed)
-----   Message from Vivi Barrack, DPM sent at 12/06/2020  6:00 AM EDT ----- I have put a referral in for dermatology at her last appointment in January for a new hyperpigmented lesion plantar right foot.  Can you follow-up on this? Thank you

## 2020-12-20 ENCOUNTER — Encounter: Payer: Self-pay | Admitting: Cardiovascular Disease

## 2020-12-20 ENCOUNTER — Other Ambulatory Visit: Payer: Self-pay

## 2020-12-20 ENCOUNTER — Ambulatory Visit (INDEPENDENT_AMBULATORY_CARE_PROVIDER_SITE_OTHER): Payer: 59 | Admitting: Cardiovascular Disease

## 2020-12-20 VITALS — BP 160/80 | HR 70 | Ht 67.0 in | Wt 141.0 lb

## 2020-12-20 DIAGNOSIS — I70229 Atherosclerosis of native arteries of extremities with rest pain, unspecified extremity: Secondary | ICD-10-CM | POA: Diagnosis not present

## 2020-12-20 DIAGNOSIS — E785 Hyperlipidemia, unspecified: Secondary | ICD-10-CM | POA: Diagnosis not present

## 2020-12-20 DIAGNOSIS — I10 Essential (primary) hypertension: Secondary | ICD-10-CM | POA: Diagnosis not present

## 2020-12-20 DIAGNOSIS — Z9889 Other specified postprocedural states: Secondary | ICD-10-CM | POA: Diagnosis not present

## 2020-12-20 NOTE — Patient Instructions (Signed)
Medication Instructions:  Your physician recommends that you continue on your current medications as directed. Please refer to the Current Medication list given to you today.  *If you need a refill on your cardiac medications before your next appointment, please call your pharmacy*   Follow-Up: At [facility], you and your health needs are our priority.  As part of our continuing mission to provide you with exceptional heart care, we have created designated Provider Care Teams.  These Care Teams include your primary Cardiologist (physician) and Advanced Practice Providers (APPs -  Physician Assistants and Nurse Practitioners) who all work together to provide you with the care you need, when you need it.  We recommend signing up for the patient portal called "MyChart".  Sign up information is provided on this After Visit Summary.  MyChart is used to connect with patients for Virtual Visits (Telemedicine).  Patients are able to view lab/test results, encounter notes, upcoming appointments, etc.  Non-urgent messages can be sent to your provider as well.   To learn more about what you can do with MyChart, go to [email or web address].    Your next appointment:   No future appointments made at this time. We will see you on an as needed basis.  Provider:   Jonathan Berry, MD 

## 2020-12-20 NOTE — Assessment & Plan Note (Signed)
History of essential hypertension with blood pressure of 160/80.  She is on Lotrel and Maxide.

## 2020-12-20 NOTE — Assessment & Plan Note (Signed)
History of hyperlipidemia on Vytorin with lipid profile performed 11/21/2020 revealing a total cholesterol 234, LDL of 148 and HDL of 44.

## 2020-12-20 NOTE — Progress Notes (Signed)
12/20/2020 [name]   April 24, 1931  [id number]  Primary Physician Sierra Reichmann, DO Primary Cardiologist: Sierra Gess MD [name], [place]  HPI:  [name] is a 87 y.o.  thin appearing widowed African-American female mother of 1 child, grandmother of 3 grandchildren is accompanied by her power of attorney [name].  She was referred by Dr. [name], her podiatrist, for right heel wound.    I last saw her in the office 07/16/2020.  She did do housework in her younger years.  Risk factors include treated hypertension, diabetes and hyperlipidemia.  Her father died of a myocardial infarction in his 60s.  She is never had a heart attack or stroke.  She denies chest pain or shortness of breath.  She has had some discoloration of her right heel over the last 3 to 4 months and did see Dr. [name] for this.  He did in office ABIs revealing a left ABI of 0.93 and on the right that was noncompressible.  She denies claudication.  She does have some pain in the right heel at night.  Since I saw her 6 months ago she really has experienced no changes.  She does have hyperpigmentation of both heels.  She complains of some pain in her heels at night.  She really denies claudication.  She denies chest pain or shortness of breath.  She is mildly hypertensive today.   Current Meds  Medication Sig  . amLODipine-benazepril (LOTREL) 10-40 MG per capsule Take 1 capsule by mouth daily.  Marland Kitchen aspirin EC 81 MG tablet Take 81 mg by mouth every morning.  . Calcium Carbonate-Vitamin D (CALTRATE 600+D PO) Take 1 tablet by mouth 2 (two) times daily.  . diclofenac Sodium (VOLTAREN) 1 % GEL Apply 2 g topically daily as needed.  . donepezil (ARICEPT) 5 MG tablet Take 5 mg by mouth at bedtime.  Marland Kitchen esomeprazole (NEXIUM) 40 MG capsule Take 40 mg by mouth every evening.  . ezetimibe-simvastatin (VYTORIN) 10-40 MG per tablet Take 1 tablet by mouth every evening.  . furosemide (LASIX) 40 MG tablet Take 40  mg by mouth 2 (two) times daily.  . Glucosamine-Chondroitin (OSTEO BI-FLEX REGULAR STRENGTH PO) Take 1 tablet by mouth daily.  Marland Kitchen HYDROcodone-acetaminophen (NORCO/VICODIN) 5-325 MG per tablet Take 1 tablet by mouth every 6 (six) hours as needed for moderate pain or severe pain.  Marland Kitchen insulin glargine (LANTUS) 100 UNIT/ML injection Inject 60 Units into the skin at bedtime.  . metFORMIN (GLUCOPHAGE) 1000 MG tablet Take 1,000 mg by mouth 2 (two) times daily with a meal.  . nortriptyline (PAMELOR) 10 MG capsule Take 10 mg by mouth at bedtime.  Marland Kitchen ROCKLATAN 0.02-0.005 % SOLN SMARTSIG:1 Drop(s) In Eye(s) Every Evening  . solifenacin (VESICARE) 5 MG tablet Take 5 mg by mouth every morning.  . traMADol (ULTRAM) 50 MG tablet Take 1 tablet (50 mg total) by mouth every 6 (six) hours as needed.  . triamterene-hydrochlorothiazide (MAXZIDE-25) 37.5-25 MG per tablet Take 1 tablet by mouth every morning.  Marland Kitchen XIIDRA 5 % SOLN      No Known Allergies  Social History   Socioeconomic History  . Marital status: Widowed    Spouse name: Not on file  . Number of children: Not on file  . Years of education: Not on file  . Highest education level: Not on file  Occupational History  . Not on file  Tobacco Use  . Smoking status: Never Smoker  . Smokeless tobacco: Never  Used  Substance and Sexual Activity  . Alcohol use: No  . Drug use: No  . Sexual activity: Not on file  Other Topics Concern  . Not on file  Social History Narrative  . Not on file   Social Determinants of Health   Financial Resource Strain: Not on file  Food Insecurity: Not on file  Transportation Needs: Not on file  Physical Activity: Not on file  Stress: Not on file  Social Connections: Not on file  Intimate Partner Violence: Not on file     Review of Systems: General: negative for chills, fever, night sweats or weight changes.  Cardiovascular: negative for chest pain, dyspnea on exertion, edema, orthopnea, palpitations, paroxysmal  nocturnal dyspnea or shortness of breath Dermatological: negative for rash Respiratory: negative for cough or wheezing Urologic: negative for hematuria Abdominal: negative for nausea, vomiting, diarrhea, bright red blood per rectum, melena, or hematemesis Neurologic: negative for visual changes, syncope, or dizziness All other systems reviewed and are otherwise negative except as noted above.    Blood pressure (!) 175/69, pulse 70, height 5\' 7"  (1.702 m), weight 141 lb (64 kg), SpO2 100 %.  General appearance: alert and no distress Neck: no adenopathy, no carotid bruit, no JVD, supple, symmetrical, trachea midline and thyroid not enlarged, symmetric, no tenderness/mass/nodules Lungs: clear to auscultation bilaterally Heart: regular rate and rhythm, S1, S2 normal, no murmur, click, rub or gallop Extremities: extremities normal, atraumatic, no cyanosis or edema Pulses: 2+ and symmetric Skin: Skin color, texture, turgor normal. No rashes or lesions Neurologic: Alert and oriented X 3, normal strength and tone. Normal symmetric reflexes. Normal coordination and gait  EKG sinus rhythm at 70 with right bundle branch block.  I personally reviewed this EKG.  ASSESSMENT AND PLAN:   Essential hypertension History of essential hypertension with blood pressure of 160/80.  She is on Lotrel and Maxide.  Hyperlipidemia History of hyperlipidemia on Vytorin with lipid profile performed 11/21/2020 revealing a total cholesterol 234, LDL of 148 and HDL of 44.  Critical limb ischemia with history of revascularization of same extremity (HCC) History of hyperpigmentation of both of her heels without skin breakdown.  Dopplers performed 07/29/2020 revealed noncompressible right ABI with a left of 0.98 and no significant obstructive disease.      07/31/2020 MD FACP,FACC,FAHA, [facility] 12/20/2020 3:54 PM

## 2020-12-20 NOTE — Assessment & Plan Note (Signed)
History of hyperpigmentation of both of her heels without skin breakdown.  Dopplers performed 07/29/2020 revealed noncompressible right ABI with a left of 0.98 and no significant obstructive disease.

## 2021-02-04 ENCOUNTER — Ambulatory Visit: Payer: 59 | Admitting: Podiatry

## 2021-02-10 ENCOUNTER — Ambulatory Visit (INDEPENDENT_AMBULATORY_CARE_PROVIDER_SITE_OTHER): Payer: 59 | Admitting: Podiatry

## 2021-02-10 ENCOUNTER — Other Ambulatory Visit: Payer: Self-pay

## 2021-02-10 DIAGNOSIS — E119 Type 2 diabetes mellitus without complications: Secondary | ICD-10-CM

## 2021-02-10 DIAGNOSIS — B351 Tinea unguium: Secondary | ICD-10-CM | POA: Diagnosis not present

## 2021-02-10 DIAGNOSIS — M79674 Pain in right toe(s): Secondary | ICD-10-CM

## 2021-02-10 DIAGNOSIS — L84 Corns and callosities: Secondary | ICD-10-CM | POA: Diagnosis not present

## 2021-02-10 DIAGNOSIS — L819 Disorder of pigmentation, unspecified: Secondary | ICD-10-CM

## 2021-02-10 DIAGNOSIS — M79675 Pain in left toe(s): Secondary | ICD-10-CM | POA: Diagnosis not present

## 2021-02-12 ENCOUNTER — Telehealth: Payer: Self-pay | Admitting: *Deleted

## 2021-02-12 NOTE — Progress Notes (Signed)
Subjective: 87 year old female presents the office today for thick, elongated toenails that she cannot trim herself.  She also gets dry spots on her heels causing discomfort.  She has not yet seen dermatology for the right foot hyperpigmented lesion.  Objective: AAO x3, NAD DP/PT pulses palpable 1/4 bilaterally, CRT less than 3 seconds Nails are hypertrophic, dystrophic, brittle, discolored, elongated 10. No surrounding redness or drainage. Tenderness nails 1-5 bilaterally. No open lesions or pre-ulcerative lesions are identified today. On the medial aspect of the right and left heel there is minimal hyperkeratotic lesion with darkened discoloration of skin.  Upon debridement new healthy skin was present without any hyperpigmented changes.   Today there is a new hyperpigmented lesion on the plantar aspect of the right heel and her daughter just noticed this area as well.  The borders are irregular.  No drainage or pus.  No pain.  No change in size compared to last appointment.  Continues to be about the same. No pain with calf compression, swelling, warmth, erythema  Assessment: Symptomatic onychomycosis, hyperkeratotic lesion right heel with new hyperpigmented lesion right plantar foot  Plan: -All treatment options discussed with the patient including all alternatives, risks, complications.  -Debride the nails x10 to any complications or bleeding.   -Sharply debrided hyperkeratotic tissue left heel without any complications or bleeding.  Dispensed gel heel pads. -Follow-up dermatology referral again.  If needed will refer to a different dermatologist. -Patient encouraged to call the office with any questions, concerns, change in symptoms.   Vivi Barrack DPM

## 2021-02-12 NOTE — Telephone Encounter (Signed)
Called and spoke with the representative from [facility] at [phone] and faxed over a referral for the patient and the fax number is [phone]. Misty Stanley

## 2021-02-12 NOTE — Telephone Encounter (Signed)
-----   Message from Vivi Barrack, DPM sent at 02/12/2021 12:01 PM EDT ----- I previously put in for dermatology referral but they never received a call.  Can you refer to [facility] dermatology?

## 2021-03-12 ENCOUNTER — Encounter: Payer: Self-pay | Admitting: Podiatry

## 2021-03-12 NOTE — Progress Notes (Signed)
Was following up on dermatology referral. New referral faxed to [facility].

## 2021-03-31 ENCOUNTER — Emergency Department (HOSPITAL_COMMUNITY)
Admission: EM | Admit: 2021-03-31 | Discharge: 2021-03-31 | Disposition: A | Discharge status: Home / Self Care | Payer: 59 | Attending: Emergency Medicine | Admitting: Emergency Medicine

## 2021-03-31 ENCOUNTER — Emergency Department (HOSPITAL_COMMUNITY): Payer: 59

## 2021-03-31 ENCOUNTER — Encounter (HOSPITAL_COMMUNITY): Payer: Self-pay | Admitting: Emergency Medicine

## 2021-03-31 ENCOUNTER — Other Ambulatory Visit: Payer: Self-pay

## 2021-03-31 DIAGNOSIS — I10 Essential (primary) hypertension: Secondary | ICD-10-CM | POA: Diagnosis not present

## 2021-03-31 DIAGNOSIS — Z7984 Long term (current) use of oral hypoglycemic drugs: Secondary | ICD-10-CM | POA: Insufficient documentation

## 2021-03-31 DIAGNOSIS — E119 Type 2 diabetes mellitus without complications: Secondary | ICD-10-CM | POA: Insufficient documentation

## 2021-03-31 DIAGNOSIS — R791 Abnormal coagulation profile: Secondary | ICD-10-CM | POA: Insufficient documentation

## 2021-03-31 DIAGNOSIS — Z794 Long term (current) use of insulin: Secondary | ICD-10-CM | POA: Diagnosis not present

## 2021-03-31 DIAGNOSIS — M542 Cervicalgia: Secondary | ICD-10-CM | POA: Diagnosis present

## 2021-03-31 DIAGNOSIS — F0390 Unspecified dementia without behavioral disturbance: Secondary | ICD-10-CM | POA: Diagnosis not present

## 2021-03-31 DIAGNOSIS — Z7982 Long term (current) use of aspirin: Secondary | ICD-10-CM | POA: Diagnosis not present

## 2021-03-31 DIAGNOSIS — R531 Weakness: Secondary | ICD-10-CM | POA: Insufficient documentation

## 2021-03-31 DIAGNOSIS — M5412 Radiculopathy, cervical region: Secondary | ICD-10-CM | POA: Diagnosis not present

## 2021-03-31 DIAGNOSIS — Z79899 Other long term (current) drug therapy: Secondary | ICD-10-CM | POA: Insufficient documentation

## 2021-03-31 LAB — COMPREHENSIVE METABOLIC PANEL
ALT: 11 U/L (ref 0–44)
AST: 18 U/L (ref 15–41)
Albumin: 3.7 g/dL (ref 3.5–5.0)
Alkaline Phosphatase: 73 U/L (ref 38–126)
Anion gap: 9 (ref 5–15)
BUN: 9 mg/dL (ref 8–23)
CO2: 24 mmol/L (ref 22–32)
Calcium: 9.8 mg/dL (ref 8.9–10.3)
Chloride: 104 mmol/L (ref 98–111)
Creatinine, Ser: 1.01 mg/dL — ABNORMAL HIGH (ref 0.44–1.00)
GFR, Estimated: 54 mL/min — ABNORMAL LOW (ref >60)
Glucose, Bld: 142 mg/dL — ABNORMAL HIGH (ref 70–99)
Potassium: 3.9 mmol/L (ref 3.5–5.1)
Sodium: 137 mmol/L (ref 135–145)
Total Bilirubin: 1.8 mg/dL — ABNORMAL HIGH (ref 0.3–1.2)
Total Protein: 7.1 g/dL (ref 6.5–8.1)

## 2021-03-31 LAB — DIFFERENTIAL
Abs Immature Granulocytes: 0.01 10*3/uL (ref 0.00–0.07)
Basophils Absolute: 0 10*3/uL (ref 0.0–0.1)
Basophils Relative: 0 %
Eosinophils Absolute: 0.1 10*3/uL (ref 0.0–0.5)
Eosinophils Relative: 1 %
Immature Granulocytes: 0 %
Lymphocytes Relative: 33 %
Lymphs Abs: 2.2 10*3/uL (ref 0.7–4.0)
Monocytes Absolute: 0.6 10*3/uL (ref 0.1–1.0)
Monocytes Relative: 9 %
Neutro Abs: 3.8 10*3/uL (ref 1.7–7.7)
Neutrophils Relative %: 57 %

## 2021-03-31 LAB — CBC
HCT: 45.1 % (ref 36.0–46.0)
Hemoglobin: 14.6 g/dL (ref 12.0–15.0)
MCH: 26.3 pg (ref 26.0–34.0)
MCHC: 32.4 g/dL (ref 30.0–36.0)
MCV: 81.1 fL (ref 80.0–100.0)
Platelets: 209 10*3/uL (ref 150–400)
RBC: 5.56 MIL/uL — ABNORMAL HIGH (ref 3.87–5.11)
RDW: 14.9 % (ref 11.5–15.5)
WBC: 6.8 10*3/uL (ref 4.0–10.5)
nRBC: 0 % (ref 0.0–0.2)

## 2021-03-31 LAB — PROTIME-INR
INR: 1 (ref 0.8–1.2)
Prothrombin Time: 13.1 seconds (ref 11.4–15.2)

## 2021-03-31 LAB — APTT: aPTT: 27 seconds (ref 24–36)

## 2021-03-31 LAB — TROPONIN I (HIGH SENSITIVITY): Troponin I (High Sensitivity): 6 ng/L (ref <18)

## 2021-03-31 MED ORDER — HYDROCODONE-ACETAMINOPHEN 5-325 MG PO TABS
1.0000 | ORAL_TABLET | Freq: Once | ORAL | Status: AC
Start: 2021-03-31 — End: 2021-03-31
  Administered 2021-03-31: 1 via ORAL
  Filled 2021-03-31: qty 1

## 2021-03-31 MED ORDER — PREDNISONE 20 MG PO TABS: 40.0000 mg | ORAL_TABLET | Freq: Every day | ORAL | 0 refills | Status: AC

## 2021-03-31 MED ORDER — HYDROCODONE-ACETAMINOPHEN 5-325 MG PO TABS: 1.0000 | ORAL_TABLET | Freq: Four times a day (QID) | ORAL | 0 refills | Status: DC | PRN

## 2021-03-31 MED ORDER — FENTANYL CITRATE PF 50 MCG/ML IJ SOSY
50.0000 ug | PREFILLED_SYRINGE | Freq: Once | INTRAMUSCULAR | Status: AC
  Administered 2021-03-31: 50 ug via INTRAVENOUS
  Filled 2021-03-31: qty 1

## 2021-03-31 NOTE — ED Notes (Signed)
Patient transported to CT 

## 2021-03-31 NOTE — ED Triage Notes (Signed)
Patient coming from home. Compliant of left arm weakness. Family endorses pt fine Saturday night at bedtime, Sunday morning pt could not move left arm.

## 2021-03-31 NOTE — Discharge Instructions (Signed)
If you develop worsening, recurrent, or continued neck pain, numbness or weakness in the arms or legs, incontinence of your bowels or bladders, numbness of your buttocks, fever, chest pain, or any other new/concerning symptoms then return to the ER for evaluation.  

## 2021-03-31 NOTE — ED Provider Notes (Signed)
[facility] EMERGENCY DEPARTMENT Provider Note   CSN: [id number] Arrival date & time: 03/31/21  1215     History Chief Complaint  Patient presents with   left sided weakness    [name] is a 87 y.o. female.  HPI 87 year old female presents with a left arm weakness.  History is mostly from daughter at the bedside.  She has been having left-sided neck pain going into her left arm for a couple months.  Tylenol seems to help a little bit.  However since yesterday her left arm seems weak and she has a hard time moving it.  She has chronic neuropathy of both lower extremities but no new weakness.  No incontinence.  The pain in her left neck does move up into her head causing a headache as well. No numbness in the arm.  She also had some chest pain that felt like it was the pain from her neck/shoulder going into her chest.  Was worse with movement.  That chest pain is gone.  Past Medical History:  Diagnosis Date   Acid reflux    Arthritis    Dementia (HCC)    Diabetes mellitus without complication (HCC)    High cholesterol    Hypertension     Patient Active Problem List   Diagnosis Date Noted   Essential hypertension 07/16/2020   Hyperlipidemia 07/16/2020   Critical limb ischemia with history of revascularization of same extremity (HCC) 07/16/2020    Past Surgical History:  Procedure Laterality Date   ABDOMINAL HYSTERECTOMY     APPENDECTOMY     CATARACT EXTRACTION, BILATERAL     CHOLECYSTECTOMY     HEMORRHOID SURGERY     TONSILLECTOMY       OB History   No obstetric history on file.     No family history on file.  Social History   Tobacco Use   Smoking status: Never   Smokeless tobacco: Never  Substance Use Topics   Alcohol use: No   Drug use: No    Home Medications Prior to Admission medications   Medication Sig Start Date End Date Taking? Authorizing Provider  HYDROcodone-acetaminophen (NORCO) 5-325 MG tablet Take 1 tablet by  mouth every 6 (six) hours as needed for severe pain. 03/31/21  Yes Pricilla Loveless, MD  predniSONE (DELTASONE) 20 MG tablet Take 2 tablets (40 mg total) by mouth daily for 5 days. 03/31/21 04/05/21 Yes Pricilla Loveless, MD  amLODipine-benazepril (LOTREL) 10-40 MG per capsule Take 1 capsule by mouth daily.    [provider]  aspirin EC 81 MG tablet Take 81 mg by mouth every morning.    [provider]  Calcium Carbonate-Vitamin D (CALTRATE 600+D PO) Take 1 tablet by mouth 2 (two) times daily.    [provider]  diclofenac Sodium (VOLTAREN) 1 % GEL Apply 2 g topically daily as needed. 12/03/20   Vivi Barrack, DPM  donepezil (ARICEPT) 5 MG tablet Take 5 mg by mouth at bedtime.    [provider]  esomeprazole (NEXIUM) 40 MG capsule Take 40 mg by mouth every evening.    [provider]  ezetimibe-simvastatin (VYTORIN) 10-40 MG per tablet Take 1 tablet by mouth every evening.    [provider]  furosemide (LASIX) 40 MG tablet Take 40 mg by mouth 2 (two) times daily. 04/10/20   [provider]  Glucosamine-Chondroitin (OSTEO BI-FLEX REGULAR STRENGTH PO) Take 1 tablet by mouth daily.    [provider]  insulin glargine (  LANTUS) 100 UNIT/ML injection Inject 60 Units into the skin at bedtime.    [provider]  metFORMIN (GLUCOPHAGE) 1000 MG tablet Take 1,000 mg by mouth 2 (two) times daily with a meal.    [provider]  nortriptyline (PAMELOR) 10 MG capsule Take 10 mg by mouth at bedtime. 05/01/20   [provider]  ROCKLATAN 0.02-0.005 % SOLN SMARTSIG:1 Drop(s) In Eye(s) Every Evening 04/09/20   [provider]  solifenacin (VESICARE) 5 MG tablet Take 5 mg by mouth every morning.    [provider]  triamterene-hydrochlorothiazide (MAXZIDE-25) 37.5-25 MG per tablet Take 1 tablet by mouth every morning.    [provider]  Benay Spice 5 % SOLN  04/10/20   [provider]     Allergies    Patient has no known allergies.  Review of Systems   Review of Systems  Cardiovascular:  Positive for chest pain (yesterday, none now).  Musculoskeletal:  Positive for neck pain.  Neurological:  Positive for weakness, numbness (chronic leg neuropathy) and headaches.  All other systems reviewed and are negative.  Physical Exam Updated Vital Signs BP 140/75   Pulse 65   Temp 98.8 F (37.1 C)   Resp 15   SpO2 99%   Physical Exam Vitals and nursing note reviewed.  Constitutional:      General: She is not in acute distress.    Appearance: She is well-developed. She is not ill-appearing or diaphoretic.  HENT:     Head: Normocephalic and atraumatic.     Right Ear: External ear normal.     Left Ear: External ear normal.     Nose: Nose normal.  Eyes:     General:        Right eye: No discharge.        Left eye: No discharge.  Cardiovascular:     Rate and Rhythm: Normal rate and regular rhythm.     Pulses:          Radial pulses are 2+ on the left side.     Heart sounds: Normal heart sounds.  Pulmonary:     Effort: Pulmonary effort is normal.     Breath sounds: Normal breath sounds.  Abdominal:     Palpations: Abdomen is soft.     Tenderness: There is no abdominal tenderness.  Musculoskeletal:     Left shoulder: Tenderness present. Decreased range of motion.     Cervical back: No rigidity. Pain with movement and muscular tenderness (left lateral along paraspinal neck and left trapezius) present. No spinous process tenderness.     Comments: Actively resists attempts at ROM of left shoulder due to pain  Skin:    General: Skin is warm and dry.  Neurological:     Mental Status: She is alert.     Comments: 5/5 strength in BUE. Somewhat limited due to pain in left shoulder. 5/5 strength in BLE. Grossly normal sensation in all 4 extremities  Psychiatric:        Mood and Affect: Mood is not anxious.    ED Results / Procedures / Treatments   Labs (all labs  ordered are listed, but only abnormal results are displayed) Labs Reviewed  CBC - Abnormal; Notable for the following components:      Result Value   RBC 5.56 (*)    All other components within normal limits  COMPREHENSIVE METABOLIC PANEL - Abnormal; Notable for the following components:   Glucose, Bld 142 (*)    Creatinine,  Ser 1.01 (*)    Total Bilirubin 1.8 (*)    GFR, Estimated 54 (*)    All other components within normal limits  PROTIME-INR  APTT  DIFFERENTIAL  URINALYSIS, ROUTINE W REFLEX MICROSCOPIC  TROPONIN I (HIGH SENSITIVITY)    EKG EKG Interpretation  Date/Time:  Monday March 31 2021 13:10:34 EDT Ventricular Rate:  84 PR Interval:  150 QRS Duration: 80 QT Interval:  402 QTC Calculation: 475 R Axis:   68 Text Interpretation: Normal sinus rhythm  no significant change since 2009 Confirmed by Pricilla Loveless [phone]) on 03/31/2021 3:33:56 PM  Radiology CT HEAD WO CONTRAST ( )  Result Date: 03/31/2021 CLINICAL DATA:  Left-sided weakness and pain in left arm since yesterday morning EXAM: CT HEAD WITHOUT CONTRAST TECHNIQUE: Contiguous axial images were obtained from the base of the skull through the vertex without intravenous contrast. COMPARISON:  None. FINDINGS: Brain: There is no evidence of acute intracranial hemorrhage, extra-axial fluid collection, or infarct. There is a small remote lacunar infarct in the right corona radiata. There is moderate parenchymal volume loss with commensurate enlargement of the ventricular system. Hypodensity in the subcortical and periventricular white matter likely reflects sequela of chronic white matter microangiopathy. There is no mass lesion. There is no midline shift. Vascular: No hyperdense vessel or unexpected calcification. Skull: Normal. Negative for fracture or focal lesion. Sinuses/Orbits: The imaged paranasal sinuses are clear. The globes and orbits are unremarkable. Other: None. IMPRESSION: 1. No acute intracranial pathology.  2. Moderate global parenchymal volume loss and chronic white matter microangiopathy. Electronically Signed   By: Lesia Hausen M.D.   On: 03/31/2021 14:13   CT Cervical Spine Wo Contrast  Result Date: 03/31/2021 CLINICAL DATA:  Cervical radiculopathy. EXAM: CT CERVICAL SPINE WITHOUT CONTRAST TECHNIQUE: Multidetector CT imaging of the cervical spine was performed without intravenous contrast. Multiplanar CT image reconstructions were also generated. COMPARISON:  X-ray cervical spine 19 2006, CT cervical spine 09/01/2009. FINDINGS: Alignment: Normal. Skull base and vertebrae: Interval worsening multilevel severe degenerative changes of the spine with most prominent changes at the C5-C6 level. Posterior disc osteophyte complex at the C5-C6 level. No severe osseous neural foraminal or central canal stenosis identified. Posterior C2 penis formation noted. No acute fracture. No aggressive appearing focal osseous lesion or focal pathologic process. Soft tissues and spinal canal: No prevertebral fluid or swelling. No visible canal hematoma. Upper chest: Unremarkable. Other: Multiple subcentimeter hypodensities within bilateral thyroid glands. Not clinically significant; no follow-up imaging recommended (ref: J Am Coll Radiol. 2015 Feb;12(2): 143-50).In the setting of significant comorbidities or limited life expectancy, no follow-up recommended (ref: J Am Coll Radiol. 2015 Feb;12(2): 143-50). IMPRESSION: 1. No acute displaced fracture or traumatic listhesis of the cervical spine. 2. Interval worsening of multilevel severe degenerative changes with no CT evidence of severe osseous neural foraminal or central canal stenosis. Electronically Signed   By: Tish Frederickson M.D.   On: 03/31/2021 18:41   DG Shoulder Left  Result Date: 03/31/2021 CLINICAL DATA:  Shoulder pain EXAM: LEFT SHOULDER - 2+ VIEW COMPARISON:  None. FINDINGS: No fracture or malalignment. Moderate glenohumeral and mild AC joint degenerative change.  IMPRESSION: Degenerative changes.  No acute osseous abnormality. Electronically Signed   By: Jasmine Pang M.D.   On: 03/31/2021 16:40    Procedures Procedures   Medications Ordered in ED Medications  fentaNYL (SUBLIMAZE) injection 50 mcg (50 mcg Intravenous Given 03/31/21 1605)  HYDROcodone-acetaminophen (NORCO/VICODIN) 5-325 MG per tablet 1 tablet (1 tablet Oral Given 03/31/21 2101)  ED Course  I have reviewed the triage vital signs and the nursing notes.  Pertinent labs & imaging results that were available during my care of the patient were reviewed by me and considered in my medical decision making (see chart for details).    MDM Rules/Calculators/A&P                           Patient's "weakness" appears to be pain rather than true neurologic weakness. Is able to move her arm much better after pain control. Shoulder xray is benign. CT neck with chronic changes. I suspect this is likely cervical radiculopathy. Given no true weakness, and otherwise benign exam, I don't think she needs emergent MRI. Not c/w cauda equina. No fevers or systemic symptoms so I doubt infection. D/c home with short course of steroids and pain control  Final Clinical Impression(s) / ED Diagnoses Final diagnoses:  Cervical radiculopathy    Rx / DC Orders ED Discharge Orders          Ordered    HYDROcodone-acetaminophen (NORCO) 5-325 MG tablet  Every 6 hours PRN        03/31/21 2019    predniSONE (DELTASONE) 20 MG tablet  Daily        03/31/21 2019             Pricilla LovelessGoldston, Lissandra Keil, MD 04/01/21 0015

## 2021-03-31 NOTE — ED Provider Notes (Signed)
Emergency Medicine Provider Triage Evaluation Note  [name] , a 87 y.o. female  was evaluated in triage.  Pt complains of  left sided arm weakness daughter noticed yesterday morning. No other concerns or difficulty walking, speaking or weakness in other limbs. No thinners or falls.  Review of Systems  Positive: Left arm weakness Negative: Droop, word slurring, neglect, cp, heacahe  Physical Exam  BP (!) 158/66 (BP Location: Right Arm)   Pulse 66   Temp 98.8 F (37.1 C)   Resp 14   SpO2 99%  Gen:   Awake, no distress   Resp:  Normal effort  MSK:   Moves extremities without difficulty  Other:    Medical Decision Making  Medically screening exam initiated at 1:21 PM.  Appropriate orders placed.  [name] was informed that the remainder of the evaluation will be completed by another provider, this initial triage assessment does not replace that evaluation, and the importance of remaining in the ED until their evaluation is complete.   No face droop, word slurring or neglect. Well-appearing. Stroke orders placed, no code stroke   Sierra Benders, PA-C 03/31/21 1324    Gloris Manchester, MD 04/02/21 [phone]

## 2021-05-19 ENCOUNTER — Encounter: Payer: Self-pay | Admitting: Podiatry

## 2021-05-19 ENCOUNTER — Other Ambulatory Visit: Payer: Self-pay

## 2021-05-19 ENCOUNTER — Ambulatory Visit (INDEPENDENT_AMBULATORY_CARE_PROVIDER_SITE_OTHER): Payer: 59 | Admitting: Podiatry

## 2021-05-19 DIAGNOSIS — E785 Hyperlipidemia, unspecified: Secondary | ICD-10-CM | POA: Insufficient documentation

## 2021-05-19 DIAGNOSIS — B351 Tinea unguium: Secondary | ICD-10-CM

## 2021-05-19 DIAGNOSIS — M19019 Primary osteoarthritis, unspecified shoulder: Secondary | ICD-10-CM | POA: Insufficient documentation

## 2021-05-19 DIAGNOSIS — R296 Repeated falls: Secondary | ICD-10-CM | POA: Insufficient documentation

## 2021-05-19 DIAGNOSIS — M5090 Cervical disc disorder, unspecified, unspecified cervical region: Secondary | ICD-10-CM | POA: Insufficient documentation

## 2021-05-19 DIAGNOSIS — E1142 Type 2 diabetes mellitus with diabetic polyneuropathy: Secondary | ICD-10-CM | POA: Insufficient documentation

## 2021-05-19 DIAGNOSIS — M79674 Pain in right toe(s): Secondary | ICD-10-CM | POA: Diagnosis not present

## 2021-05-19 DIAGNOSIS — M79675 Pain in left toe(s): Secondary | ICD-10-CM

## 2021-05-19 DIAGNOSIS — E119 Type 2 diabetes mellitus without complications: Secondary | ICD-10-CM | POA: Diagnosis not present

## 2021-05-19 DIAGNOSIS — I739 Peripheral vascular disease, unspecified: Secondary | ICD-10-CM | POA: Diagnosis not present

## 2021-05-19 DIAGNOSIS — L84 Corns and callosities: Secondary | ICD-10-CM | POA: Diagnosis not present

## 2021-05-19 DIAGNOSIS — E1121 Type 2 diabetes mellitus with diabetic nephropathy: Secondary | ICD-10-CM | POA: Insufficient documentation

## 2021-05-19 DIAGNOSIS — L600 Ingrowing nail: Secondary | ICD-10-CM

## 2021-05-19 DIAGNOSIS — R269 Unspecified abnormalities of gait and mobility: Secondary | ICD-10-CM | POA: Insufficient documentation

## 2021-05-19 DIAGNOSIS — R413 Other amnesia: Secondary | ICD-10-CM | POA: Insufficient documentation

## 2021-05-19 MED ORDER — MUPIROCIN 2 % EX OINT: 1.0000 "application " | TOPICAL_OINTMENT | Freq: Two times a day (BID) | CUTANEOUS | 2 refills | Status: DC

## 2021-05-19 MED ORDER — CEPHALEXIN 500 MG PO CAPS: 500.0000 mg | ORAL_CAPSULE | Freq: Three times a day (TID) | ORAL | 0 refills | Status: DC

## 2021-05-20 NOTE — Progress Notes (Signed)
Subjective: 87 year old female presents the office today for thick, elongated toenails that she cannot trim herself.  She also gets dry spots on her heels causing discomfort.  She has been getting moisturizer on the heel which is been doing better.  She did follow-up with dermatology and the spot on the right foot was treated.  She is having some tenderness to the area.  She does have a follow-up scheduled.  Objective: AAO x3, NAD DP/PT pulses palpable 1/4 bilaterally, CRT less than 3 seconds Nails are hypertrophic, dystrophic, brittle, discolored, elongated 10. Tenderness nails 1-5 bilaterally. No open lesions or pre-ulcerative lesions are identified today.  Ingrowing toenail present on the right medial hallux nail border.  Remission tissue present.  Localized edema and erythema.  No ascending cellulitis.  No purulence. On medial aspect left heel hyperkeratotic lesion.  Upon debridement there is no underlying ulceration drainage or any signs of infection.  There is a hyperpigmented lesion plantar aspect of the right heel has a small amount of blood but there is no purulence.  No edema, erythema or signs of infection.  Appears to be healing. No pain with calf compression, swelling, warmth, erythema  Assessment: Symptomatic onychomycosis, hyperkeratotic lesion right heel with hyperpigmented lesion right plantar foot; infected ingrown toenail right medial hallux  Plan: -All treatment options discussed with the patient including all alternatives, risks, complications.  -Debride the nails x10 to any complications or bleeding.   -Sharply debrided the ingrown portion of the nail of the right hallux without any complications or bleeding.  Recommended antibiotic ointment dressing changes.  Prescribed Keflex and mupirocin ointment.  Monitor for any signs or symptoms of worsening infection. -Debrided the hyperkeratotic lesion left heel without any complications or bleeding.  Continue moisturizer and  offloading. -Small amount of blood on the lesion on her right foot.  I debrided some of the loose hyperkeratotic tissue.  I would keep a small amount of antibiotic ointment on this or followed the instructions from the dermatologist.  Return in about 9 weeks (around 07/21/2021). (Note sent to our schedulers to have her follow-up in 2 weeks as well for the ingrown toenail.  Given contact the patient.)  Vivi Barrack DPM

## 2021-05-21 ENCOUNTER — Telehealth: Payer: Self-pay | Admitting: *Deleted

## 2021-05-21 NOTE — Telephone Encounter (Signed)
Jaquita Folds (God daughter of patient,resides w/ her)is calling on behalf of patient for the status of medications that were supposed to be sent to pharmacy,not at the [facility] on [address]. Called [facility] and prescriptions were there waiting for Pick up. They stated that the medications can be sent to [facility] if their pharmacy would contact them for the request. Spoke with pharmacy there,stated that they would contact to have prescriptions sent  Contacted the patient"s God daughter to inform of this, verbalized understanding and said that she will pick up in afternoon after work.

## 2021-06-06 ENCOUNTER — Ambulatory Visit (INDEPENDENT_AMBULATORY_CARE_PROVIDER_SITE_OTHER): Payer: 59 | Admitting: Podiatry

## 2021-06-06 ENCOUNTER — Other Ambulatory Visit: Payer: Self-pay

## 2021-06-06 ENCOUNTER — Ambulatory Visit (INDEPENDENT_AMBULATORY_CARE_PROVIDER_SITE_OTHER): Payer: 59 | Admitting: Podiatrist

## 2021-06-06 DIAGNOSIS — E119 Type 2 diabetes mellitus without complications: Secondary | ICD-10-CM

## 2021-06-06 DIAGNOSIS — M21619 Bunion of unspecified foot: Secondary | ICD-10-CM

## 2021-06-06 DIAGNOSIS — I739 Peripheral vascular disease, unspecified: Secondary | ICD-10-CM

## 2021-06-06 DIAGNOSIS — L600 Ingrowing nail: Secondary | ICD-10-CM | POA: Diagnosis not present

## 2021-06-06 NOTE — Progress Notes (Signed)
DIABETIC SHOES CASTING:  Patient presented for foam casting for 3 pr custom diabetic shoe inserts-  Patient is measured with a [employer] device to be a size 11.5 womens shoe   Diabetic shoes are chosen from the [employer].  The shoes chosen are linda by apex in black  The patient will be contacted when the shoes and insert are ready to be picked up.    ABN signed and patient notified of out of pocket expense.

## 2021-06-09 NOTE — Progress Notes (Signed)
Subjective: 87 year old female presents the office today for follow-up evaluation of ingrown toenail, infection of the right foot.  States it is doing better and she feels it is healing.  Monitor pain level is 3/10.  No swelling or redness or any drainage.  No warmth.  She completed a course of antibiotics.  She has no concerns otherwise today.  No fevers or chills.  Objective: AAO x3, NAD DP/PT pulses palpable bilaterally, CRT less than 3 seconds On the right medial hallux nail border small amount of hyperkeratotic tissue present which was debrided today.  Also debride the nail again today there is no drainage or pus.  No significant edema, erythema or any signs of infection.  Incurvation of the left hallux toenail with tenderness palpation along nail border there is no edema, erythema or signs of infection to this nail. No pain with calf compression, swelling, warmth, erythema  Assessment: 87 year old female with ingrown toenails currently without signs of infection  Plan: -All treatment options discussed with the patient including all alternatives, risks, complications.  -Postoperatively the symptomatic portion of the nail with any complications or bleeding today.  Monitor for any reoccurrence of infection.  There is any worsening pain prior to next appointment 11 knows well. -Patient encouraged to call the office with any questions, concerns, change in symptoms.   Vivi Barrack DPM

## 2021-07-07 ENCOUNTER — Telehealth: Payer: Self-pay | Admitting: Podiatry

## 2021-07-07 NOTE — Telephone Encounter (Signed)
Diabetic shoes/inserts in.. pts niece to call to schedule an appt.

## 2021-07-10 ENCOUNTER — Ambulatory Visit: Payer: 59

## 2021-07-10 ENCOUNTER — Other Ambulatory Visit: Payer: Self-pay

## 2021-07-10 DIAGNOSIS — E119 Type 2 diabetes mellitus without complications: Secondary | ICD-10-CM

## 2021-07-10 NOTE — Progress Notes (Signed)
Attempted to fit diabetic shoes and insoles. Shoes were too large with a poor method of closure. Remeasured patient and will order Apex [id number] Women's 22M

## 2021-07-21 ENCOUNTER — Ambulatory Visit (INDEPENDENT_AMBULATORY_CARE_PROVIDER_SITE_OTHER): Payer: 59 | Admitting: Podiatry

## 2021-07-21 ENCOUNTER — Other Ambulatory Visit: Payer: Self-pay

## 2021-07-21 DIAGNOSIS — M79674 Pain in right toe(s): Secondary | ICD-10-CM | POA: Diagnosis not present

## 2021-07-21 DIAGNOSIS — B351 Tinea unguium: Secondary | ICD-10-CM

## 2021-07-21 DIAGNOSIS — I739 Peripheral vascular disease, unspecified: Secondary | ICD-10-CM

## 2021-07-21 DIAGNOSIS — M79675 Pain in left toe(s): Secondary | ICD-10-CM | POA: Diagnosis not present

## 2021-07-21 DIAGNOSIS — L97511 Non-pressure chronic ulcer of other part of right foot limited to breakdown of skin: Secondary | ICD-10-CM

## 2021-07-21 DIAGNOSIS — E119 Type 2 diabetes mellitus without complications: Secondary | ICD-10-CM

## 2021-07-21 MED ORDER — MUPIROCIN 2 % EX OINT: 1.0000 "application " | TOPICAL_OINTMENT | Freq: Two times a day (BID) | CUTANEOUS | 2 refills | Status: DC

## 2021-07-21 MED ORDER — CEPHALEXIN 500 MG PO CAPS: 500.0000 mg | ORAL_CAPSULE | Freq: Three times a day (TID) | ORAL | 0 refills | Status: DC

## 2021-07-22 ENCOUNTER — Telehealth: Payer: Self-pay | Admitting: Podiatry

## 2021-07-22 NOTE — Progress Notes (Signed)
Subjective: 87 year old female presents the office today for thick, elongated toenails that she cannot trim her self.  She also has pain on the side of her right foot bunion fifth metatarsal base.  Also she had a biopsy with dermatology on the plantar aspect of the right foot which is still causing tenderness.  She denies any drainage or any open sores.  No swelling or redness.  She has no new concerns today.   Objective: AAO x3, NAD DP/PT pulses palpable 1/4 bilaterally, CRT less than 3 seconds Nails appear to be hypertrophic, dystrophic with brown discoloration.  Incurvation present hallux toenail with some callus formation.  Upon debridement there is no drainage or pus.  No significant pain identified today.  There is no signs of abscess or signs of infection today.  She does get tenderness nails 1-5 bilaterally as are thickened elongated and she cannot trim them herself. On the right foot plantar heel, right fifth metatarsal base of the hyperkeratotic lesion.  Upon debridement there is actually superficial opening of the skin without any probing, and or tunneling.  There is no surrounding erythema, ascending cellulitis.  There is no fluctuation crepitation there is no malodor No pain with calf compression, swelling, warmth, erythema  Assessment: 87 year old female with symptomatic onychomycosis, ingrown toenail, ulcerations right foot with PAD  Plan: -All treatment options discussed with the patient including all alternatives, risks, complications.  -Sharply debrided nails x10 without any complications or bleeding. -Sharply debrided hyperkeratotic lesions right foot plantar heel, fifth metatarsal base.  Upon debridement there is superficial opening the skin.  Debrided nonviable tissue utilizing a 312 scalpel.  This was debrided down to granular tissue.  There is no blood loss.  She tolerated well.  I cleansed the wound.  Antibiotic ointment dressing was applied. -Given the wounds recommended  offloading with surgical shoe which was dispensed. Prescribed cephalexin as well as mupirocin ointment dressing changes daily.  Monitor closely for signs or symptoms of infection report emergency department should any occur. -PAD- ordered updated ABI  Vivi Barrack DPM    -Postoperatively the symptomatic portion of the nail with any complications or bleeding today.  Monitor for any reoccurrence of infection.  There is any worsening pain prior to next appointment 11 knows well. -Patient encouraged to call the office with any questions, concerns, change in symptoms.   Vivi Barrack DPM

## 2021-07-22 NOTE — Telephone Encounter (Signed)
Reordered diabetic shoes in... pt to have her transportation tocall to schedule an appt to  pick them up.

## 2021-07-30 ENCOUNTER — Other Ambulatory Visit: Payer: Self-pay

## 2021-07-30 ENCOUNTER — Ambulatory Visit (HOSPITAL_COMMUNITY)
Admission: RE | Admit: 2021-07-30 | Discharge: 2021-07-30 | Disposition: A | Discharge status: Home / Self Care | Payer: 59 | Source: Ambulatory Visit | Attending: Podiatry | Admitting: Podiatry

## 2021-07-30 DIAGNOSIS — I739 Peripheral vascular disease, unspecified: Secondary | ICD-10-CM

## 2021-08-06 ENCOUNTER — Telehealth: Payer: Self-pay | Admitting: Podiatry

## 2021-08-06 NOTE — Telephone Encounter (Signed)
Pt left message last week when I wous out of the office asking about scheduling an appt to pick up her diabetic shoes.  I returned call and left message for pt that she has an appt 1.6 with Dr Jacqualyn Posey and the shoes can be dispensed at that appt.

## 2021-08-08 ENCOUNTER — Other Ambulatory Visit: Payer: Self-pay

## 2021-08-08 ENCOUNTER — Ambulatory Visit (INDEPENDENT_AMBULATORY_CARE_PROVIDER_SITE_OTHER): Payer: 59 | Admitting: Podiatry

## 2021-08-08 DIAGNOSIS — I739 Peripheral vascular disease, unspecified: Secondary | ICD-10-CM | POA: Diagnosis not present

## 2021-08-08 DIAGNOSIS — L84 Corns and callosities: Secondary | ICD-10-CM | POA: Diagnosis not present

## 2021-08-08 DIAGNOSIS — E119 Type 2 diabetes mellitus without complications: Secondary | ICD-10-CM | POA: Diagnosis not present

## 2021-08-08 DIAGNOSIS — L97511 Non-pressure chronic ulcer of other part of right foot limited to breakdown of skin: Secondary | ICD-10-CM

## 2021-08-08 DIAGNOSIS — E1140 Type 2 diabetes mellitus with diabetic neuropathy, unspecified: Secondary | ICD-10-CM | POA: Diagnosis not present

## 2021-08-12 NOTE — Progress Notes (Signed)
Subjective: 88 year old female presents the office today with a caregiver for follow-up evaluation of wound on the right foot.  She states that it is gotten harder again because of discomfort.  Denies any drainage or pus or any increase in swelling or redness.  She also presents today for diabetic shoes.  Objective: AAO x3, NAD DP/PT pulses palpable bilaterally, CRT less than 3 seconds On plantar aspect of the heel on the right side there is a hyperkeratotic lesion but upon debridement there is no underlying ulceration drainage or signs of infection.  On the fifth metatarsal base is a hyperkeratotic lesion and upon debridement superficial area of skin breakdown there is no exposed bone or tendon.  No surrounding erythema, ascending cellulitis.  No fluctuation or crepitation.  No malodor.   No pain with calf compression, swelling, warmth, erythema  Assessment: Right fifth metatarsal base ulceration  Plan: -All treatment options discussed with the patient including all alternatives, risks, complications.  -Debrided hyperkeratotic lesions without any complications or bleeding.  Continue topical medication changes daily.  Dispensed offloading pad as well. -Diabetic shoes were dispensed.  Break-in instructions were discussed with the patient.  Discussed there is any irritation skin or any issues.  Wearing the monitor is no immediately. -Arterial studies were abnormal. Discussed with Dr. Gwenlyn Found. 07/29/20 that showed widely patent vessels from her CFA down to her foot except for an occluded AT. She appears to have adequate blood flow to heal an ischemic ulcer at least from a large vessel point of view.  -Patient encouraged to call the office with any questions, concerns, change in symptoms.   Trula Slade DPM

## 2021-09-01 ENCOUNTER — Ambulatory Visit (INDEPENDENT_AMBULATORY_CARE_PROVIDER_SITE_OTHER): Payer: 59

## 2021-09-01 ENCOUNTER — Other Ambulatory Visit: Payer: Self-pay

## 2021-09-01 ENCOUNTER — Ambulatory Visit (INDEPENDENT_AMBULATORY_CARE_PROVIDER_SITE_OTHER): Payer: 59 | Admitting: Podiatry

## 2021-09-01 DIAGNOSIS — E119 Type 2 diabetes mellitus without complications: Secondary | ICD-10-CM | POA: Diagnosis not present

## 2021-09-01 DIAGNOSIS — L97511 Non-pressure chronic ulcer of other part of right foot limited to breakdown of skin: Secondary | ICD-10-CM

## 2021-09-01 DIAGNOSIS — I739 Peripheral vascular disease, unspecified: Secondary | ICD-10-CM

## 2021-09-01 NOTE — Progress Notes (Signed)
Subjective: 88 year old female presents the office today with a caregiver for follow-up evaluation of wound on the right foot.  States that she still having pain to the foot.  Occasionally she will get pain in the legs.  She did see her primary care was prescribed tramadol for pain.  She also has neuropathy.  We will continue with antibiotic ointment dressing changes daily.  Did not see any drainage or pus or increasing swelling.  She is not able to wear diabetic shoes as it causes pressure.  No fevers or chills.  No other concerns.   Objective: AAO x3, NAD DP/PT pulses palpable but decreased bilaterally, CRT less than 3 seconds On plantar aspect of the heel on the right side there is a hyperkeratotic lesion but upon debridement there is no underlying ulceration drainage or signs of infection.  The area is preulcerative but there is no definitive skin breakdown today.  Faint edema present but there is no surrounding erythema, ascending cellulitis.  No drainage or pus.  No fluctuation or crepitation but there is no malodor.   Minimal hyperkeratotic tissue plantar right heel.  No other open lesions or preulcerative lesions.  Nails are mildly hypertrophic, dystrophic but not causing any discomfort. No pain with calf compression, swelling, warmth, erythema  Prior to debridement:    Assessment: Right fifth metatarsal base ulceration  Plan: -All treatment options discussed with the patient including all alternatives, risks, complications.  -X-rays obtained reviewed.  No definitive evidence of cortical changes suggestive of osteomyelitis.  Chronic changes noted to the fifth metatarsal base however.  No evidence of acute fracture.  Osteopenia present. -Sharp debrided the hyperkeratotic lesion right fifth metatarsal base with any complications or bleeding.  Appears the wound is healed but is preulcerative.  Continue offloading. -She still has pain in her legs that she is describing today.  She was told  it could be neuropathy as well as arthritis.  I am still somewhat concerned about her circulation causing the leg pain. 07/29/20 that showed widely patent vessels from her CFA down to her foot except for an occluded AT. She appears to have adequate blood flow to heal an ischemic ulcer at least from a large vessel point of view.  -As a courtesy debrided the nails without any complications or bleeding -Patient encouraged to call the office with any questions, concerns, change in symptoms.   RTC 3 weeks or sooner if needed  Vivi Barrack DPM

## 2021-09-22 ENCOUNTER — Ambulatory Visit: Payer: 59 | Admitting: Podiatry

## 2021-10-06 ENCOUNTER — Other Ambulatory Visit: Payer: Self-pay

## 2021-10-06 ENCOUNTER — Ambulatory Visit (INDEPENDENT_AMBULATORY_CARE_PROVIDER_SITE_OTHER): Payer: 59 | Admitting: Podiatry

## 2021-10-06 DIAGNOSIS — L97511 Non-pressure chronic ulcer of other part of right foot limited to breakdown of skin: Secondary | ICD-10-CM | POA: Diagnosis not present

## 2021-10-06 DIAGNOSIS — E119 Type 2 diabetes mellitus without complications: Secondary | ICD-10-CM | POA: Diagnosis not present

## 2021-10-06 DIAGNOSIS — M79674 Pain in right toe(s): Secondary | ICD-10-CM | POA: Diagnosis not present

## 2021-10-06 DIAGNOSIS — L600 Ingrowing nail: Secondary | ICD-10-CM | POA: Diagnosis not present

## 2021-10-06 DIAGNOSIS — B351 Tinea unguium: Secondary | ICD-10-CM | POA: Diagnosis not present

## 2021-10-06 DIAGNOSIS — M79675 Pain in left toe(s): Secondary | ICD-10-CM

## 2021-10-06 MED ORDER — MUPIROCIN 2 % EX OINT: 1.0000 "application " | TOPICAL_OINTMENT | Freq: Two times a day (BID) | CUTANEOUS | 2 refills | Status: DC

## 2021-10-06 MED ORDER — DOXYCYCLINE HYCLATE 100 MG PO TABS: 100.0000 mg | ORAL_TABLET | Freq: Two times a day (BID) | ORAL | 0 refills | Status: DC

## 2021-10-09 NOTE — Progress Notes (Signed)
Subjective: ?88 year old female presents the office today with a caregiver for concerns of thick, elongated toenails that she cannot trim herself.  She still does get discomfort along the callus, side of her right foot.  Did not see any drainage or pus.  They have tried different moisturizers and pain creams to help the discomfort.  He has not seen any increase in swelling or redness.  No drainage.  No other concerns.  No fevers or chills. ? ? ?Objective: ?AAO x3, NAD ?DP/PT pulses palpable but decreased bilaterally, CRT less than 3 seconds ?Nails are hypertrophic, dystrophic, brittle, discolored, elongated ?10.  Ingrowing toenail present along the right lateral nail border with small mount of granulation tissue noted along the nail corner.  No surrounding redness or drainage. Tenderness nails 1-5 bilaterally.  ?Lateral aspect of the fifth metatarsal base on the right foot is a thick hyperkeratotic lesion.  Initially there is no drainage possibly signs of infection or open lesion.  After debridement there is still superficial area of skin breakdown with a lot of clear drainage but there is no frank purulence.  There is no probing to bone, undermining or tunneling.  There is no surrounding erythema, ascending cellulitis.  No fluctuation or crepitation.  There is no malodor. ?No pain with calf compression, swelling, warmth, erythema ? ? ?Assessment: ?Right fifth metatarsal base ulceration; ingrown toenail right hallux; symptomatic onychomycosis ? ?Plan: ?-All treatment options discussed with the patient including all alternatives, risks, complications.  ?-Sharply debrided the nails x10 without any complications or bleeding.  In particular I sharply debrided the lateral border of the right hallux toenail with any complications or bleeding.  There was some granulation tissue noted on the distal portion of the nail which is also clean. ?-Sharply debrided the hyperkeratotic lesion right fifth metatarsal base with any  complications to reveal still superficial wound present. ?-Given the right hallux toenail as well as still the wound on the right fifth metatarsal base prescribe doxycycline.  Also continue with mupirocin ointment dressing changes daily. ?-Monitor for any clinical signs or symptoms of infection and directed to call the office immediately should any occur or go to the ER. ? ?Return in about 2 weeks (around 10/20/2021).  Wound check right lateral foot, ingrown toenail right hallux ? ?Vivi Barrack DPM ?

## 2021-10-20 ENCOUNTER — Ambulatory Visit: Payer: 59 | Admitting: Podiatry

## 2021-10-24 ENCOUNTER — Other Ambulatory Visit: Payer: Self-pay

## 2021-10-24 ENCOUNTER — Ambulatory Visit (INDEPENDENT_AMBULATORY_CARE_PROVIDER_SITE_OTHER): Payer: 59 | Admitting: Podiatry

## 2021-10-24 DIAGNOSIS — E119 Type 2 diabetes mellitus without complications: Secondary | ICD-10-CM | POA: Diagnosis not present

## 2021-10-24 DIAGNOSIS — L97511 Non-pressure chronic ulcer of other part of right foot limited to breakdown of skin: Secondary | ICD-10-CM

## 2021-10-27 NOTE — Progress Notes (Signed)
Subjective: ?88 year old female presents the office today for follow evaluation of wound to the lateral aspect of the fifth metatarsal base on the right foot.  She said the area is still tender.  She denies any increase in swelling redness or any drainage.  No pus.  Denies any fevers or chills.  She has no other concerns today. ? ?Objective: ?AAO x3, NAD ?DP/PT pulses palpable but decreased bilaterally, CRT less than 3 seconds ?Lateral aspect of the fifth metatarsal base on the right foot is a thick hyperkeratotic lesion.  Upon debridement there is still superficial area skin breakdown in the central aspect there is no probing, undermining or tunneling.  There is no surrounding erythema, ascending cellulitis.  No fluctuation or crepitation.  There is no malodor. ?No pain with calf compression, swelling, warmth, erythema ? ? ? ? ?Assessment: ?Right fifth metatarsal base ulceration ? ?Plan: ?-All treatment options discussed with the patient including all alternatives, risks, complications.  ?-Sharply debrided the hyperkeratotic lesion to any complications or bleeding to still reveal the underlying ulcer.  Recommend continue antibiotic ointment dressing changes daily and continue offloading at all times.  Once the wound is healed we can use different pain creams to try to help as well as moisturizers help with the callus.  Continue offloading. ?-Monitor for any clinical signs or symptoms of infection and directed to call the office immediately should any occur or go to the ER. ? ?Return in about 4 weeks (around 11/21/2021).  X-ray next appointment ? ?Vivi Barrack DPM ?

## 2021-11-21 ENCOUNTER — Encounter: Payer: Self-pay | Admitting: Podiatry

## 2021-11-21 ENCOUNTER — Ambulatory Visit (INDEPENDENT_AMBULATORY_CARE_PROVIDER_SITE_OTHER): Payer: 59 | Admitting: Podiatry

## 2021-11-21 ENCOUNTER — Ambulatory Visit (INDEPENDENT_AMBULATORY_CARE_PROVIDER_SITE_OTHER): Payer: 59

## 2021-11-21 DIAGNOSIS — L97511 Non-pressure chronic ulcer of other part of right foot limited to breakdown of skin: Secondary | ICD-10-CM

## 2021-11-24 NOTE — Progress Notes (Signed)
Subjective: ?88 year old female presents the office today for follow evaluation of wound to the lateral aspect of the fifth metatarsal base on the right foot.  She continues to have discomfort but she is not seeing any drainage or pus or increasing swelling or redness to the area.  She has tried numerous creams without significant improvement.  Denies any fevers or chills currently.  She has no other concerns today.   ? ?Objective: ?AAO x3, NAD ?DP/PT pulses palpable but decreased bilaterally, CRT less than 3 seconds ?Hyperkeratotic lesion noted to the fifth metatarsal base of the right foot.  Upon debridement there is no definitive skin breakdown today but there is thickened hyperkeratotic tissue.  There is tenderness palpation of the area.  There is no edema, erythema or signs of infection noted today. ?No pain with calf compression, swelling, warmth, erythema ? ?Assessment: ?Right fifth metatarsal base ulceration ? ?Plan: ?-All treatment options discussed with the patient including all alternatives, risks, complications.  ?-X-rays were obtained and reviewed of the right foot.  3 views were obtained.  There is no definitive cortical destruction suggestive of osteomyelitis there is no evidence of acute fracture.  On the lateral view in the plantar aspect fifth metatarsal base is 1 punched-out area but has not changed compared to prior x-rays and likely chronic. ?-Sharply debrided the hyperkeratotic lesion today without any complications or bleeding.  Still in quite a bit of tenderness and thickened hyperkeratotic tissue.  Ranging urea on this to see if this will be beneficial as well as continue offloading.  She is previous had arterial studies and is followed up with Dr. Alvester Chou for this.  Patient wants to make sure there is nothing else that can be done to help with her circulation. ?-Monitor for any clinical signs or symptoms of infection and directed to call the office immediately should any occur or go to the  ER. ? ?Return in about 6 weeks (around 01/02/2022). ? ?Trula Slade DPM ? ?Cc: Dr. Quay Burow, MD ? ?

## 2021-12-09 ENCOUNTER — Telehealth: Payer: Self-pay

## 2021-12-09 DIAGNOSIS — I739 Peripheral vascular disease, unspecified: Secondary | ICD-10-CM

## 2021-12-09 NOTE — Telephone Encounter (Signed)
Left message for pt to call back to schedule LEA dopplers and office visit with Dr. Allyson Sabal.  ?

## 2022-01-02 ENCOUNTER — Ambulatory Visit (INDEPENDENT_AMBULATORY_CARE_PROVIDER_SITE_OTHER): Payer: 59 | Admitting: Podiatry

## 2022-01-02 DIAGNOSIS — I739 Peripheral vascular disease, unspecified: Secondary | ICD-10-CM

## 2022-01-02 DIAGNOSIS — B351 Tinea unguium: Secondary | ICD-10-CM | POA: Diagnosis not present

## 2022-01-02 DIAGNOSIS — M79674 Pain in right toe(s): Secondary | ICD-10-CM | POA: Diagnosis not present

## 2022-01-02 DIAGNOSIS — E119 Type 2 diabetes mellitus without complications: Secondary | ICD-10-CM | POA: Diagnosis not present

## 2022-01-02 DIAGNOSIS — M79675 Pain in left toe(s): Secondary | ICD-10-CM

## 2022-01-02 DIAGNOSIS — L84 Corns and callosities: Secondary | ICD-10-CM

## 2022-01-05 NOTE — Progress Notes (Signed)
Subjective: 88 year old female presents the office today for thick, elongated toenails that she is not able to trim her self as well as for preulcerative painful callus on the lateral aspect the right foot.  She states that it is doing better and gets occasional discomfort.  No open lesions she reports no swelling or redness or any drainage.  No new concerns.  Objective: AAO x3, NAD DP/PT pulses palpable 1/4 bilaterally, CRT less than 3 seconds Nails are hypertrophic, dystrophic, brittle, discolored, elongated 10. No surrounding redness or drainage. Tenderness nails 1-5 bilaterally.  Hyperkeratotic lesion noted fifth metatarsal base right foot and upon debridement there is no underlying ulceration drainage or any signs of infection noted today.  There is no open lesions bilaterally. No pain with calf compression, swelling, warmth, erythema  Assessment: 88 year old female with symptomatic onychomycosis, preulcerative callus right foot  Plan: -All treatment options discussed with the patient including all alternatives, risks, complications.  -Hyperkeratotic lesion sharply debrided x1 without any complications or bleeding.  Wound appears to be healed but he is to monitor for any reoccurrence or any signs or symptoms of infection.  Offloading. -Nails sharply debrided x10 without any complications or bleeding. -Daily foot inspection. -Patient encouraged to call the office with any questions, concerns, change in symptoms.   Vivi Barrack DPM

## 2022-01-08 ENCOUNTER — Encounter (HOSPITAL_COMMUNITY): Payer: 59

## 2022-01-19 ENCOUNTER — Telehealth: Payer: Self-pay

## 2022-01-19 ENCOUNTER — Encounter (HOSPITAL_COMMUNITY): Payer: Self-pay

## 2022-01-19 ENCOUNTER — Ambulatory Visit (HOSPITAL_COMMUNITY)
Admission: RE | Admit: 2022-01-19 | Discharge: 2022-01-19 | Disposition: A | Discharge status: Home / Self Care | Payer: 59 | Source: Ambulatory Visit | Attending: Cardiovascular Disease | Admitting: Cardiovascular Disease

## 2022-01-19 DIAGNOSIS — I739 Peripheral vascular disease, unspecified: Secondary | ICD-10-CM

## 2022-01-19 NOTE — Telephone Encounter (Signed)
Left message to call back for appointment with Dr. Allyson Sabal to review dopplers scheduled 6/19 at 3pm.

## 2022-03-09 ENCOUNTER — Ambulatory Visit (INDEPENDENT_AMBULATORY_CARE_PROVIDER_SITE_OTHER): Payer: 59 | Admitting: Podiatry

## 2022-03-09 DIAGNOSIS — I739 Peripheral vascular disease, unspecified: Secondary | ICD-10-CM | POA: Diagnosis not present

## 2022-03-09 DIAGNOSIS — M79675 Pain in left toe(s): Secondary | ICD-10-CM | POA: Diagnosis not present

## 2022-03-09 DIAGNOSIS — M79674 Pain in right toe(s): Secondary | ICD-10-CM | POA: Diagnosis not present

## 2022-03-09 DIAGNOSIS — B351 Tinea unguium: Secondary | ICD-10-CM

## 2022-03-09 DIAGNOSIS — E119 Type 2 diabetes mellitus without complications: Secondary | ICD-10-CM

## 2022-03-09 DIAGNOSIS — L84 Corns and callosities: Secondary | ICD-10-CM | POA: Diagnosis not present

## 2022-03-11 NOTE — Progress Notes (Signed)
Subjective: 88 year old female presents the office today for thick, elongated toenails that she is not able to trim herself as well as for preulcerative painful callus on the lateral aspect the right foot.  She said the callus has been feeling somewhat better.  No open lesions.  She also has a callus on her left heel which causes discomfort.  No drainage or pus or any openings that she reports.    Objective: AAO x3, NAD DP/PT pulses palpable 1/4 bilaterally, CRT less than 3 seconds Nails are hypertrophic, dystrophic, brittle, discolored, elongated 10. No surrounding redness or drainage. Tenderness nails 1-5 bilaterally.  Hyperkeratotic lesion noted fifth metatarsal base right foot and left heel however after debridement there is no underlying ulceration drainage or any signs of infection noted today.  There is no open lesions bilaterally. No pain with calf compression, swelling, warmth, erythema  Assessment: 88 year old female with symptomatic onychomycosis, preulcerative callus right foot  Plan: -All treatment options discussed with the patient including all alternatives, risks, complications.  -Hyperkeratotic lesion sharply debrided x 2 without any complications or bleeding.  Moisturizer offloading daily. -Nails sharply debrided x10 without any complications or bleeding. -Daily foot inspection. -Patient encouraged to call the office with any questions, concerns, change in symptoms.   Vivi Barrack DPM

## 2022-03-20 ENCOUNTER — Ambulatory Visit (HOSPITAL_COMMUNITY)
Admission: RE | Admit: 2022-03-20 | Discharge: 2022-03-20 | Disposition: A | Discharge status: Home / Self Care | Payer: 59 | Source: Ambulatory Visit | Attending: Cardiovascular Disease | Admitting: Cardiovascular Disease

## 2022-03-20 DIAGNOSIS — I739 Peripheral vascular disease, unspecified: Secondary | ICD-10-CM | POA: Insufficient documentation

## 2022-05-15 ENCOUNTER — Ambulatory Visit (INDEPENDENT_AMBULATORY_CARE_PROVIDER_SITE_OTHER): Payer: 59 | Admitting: Podiatry

## 2022-05-15 DIAGNOSIS — L89890 Pressure ulcer of other site, unstageable: Secondary | ICD-10-CM

## 2022-05-15 DIAGNOSIS — M79675 Pain in left toe(s): Secondary | ICD-10-CM

## 2022-05-15 DIAGNOSIS — M79674 Pain in right toe(s): Secondary | ICD-10-CM | POA: Diagnosis not present

## 2022-05-15 DIAGNOSIS — B351 Tinea unguium: Secondary | ICD-10-CM

## 2022-05-15 DIAGNOSIS — L84 Corns and callosities: Secondary | ICD-10-CM

## 2022-05-15 DIAGNOSIS — E119 Type 2 diabetes mellitus without complications: Secondary | ICD-10-CM | POA: Diagnosis not present

## 2022-05-15 NOTE — Progress Notes (Unsigned)
Subjective: 88 year old female presents the office today for thick, elongated toenails that she is not able to trim herself as well as for preulcerative painful callus on the lateral aspect the right foot.  States that her neuropathy is still causing issues but nothing is changed as I saw her last for the last several appointments.  No recent injuries or falls.  No open lesions.   Objective: AAO x3, NAD DP/PT pulses palpable 1/4 bilaterally, CRT less than 3 seconds Nails are hypertrophic, dystrophic, brittle, discolored, elongated 10. No surrounding redness or drainage. Tenderness nails 1-5 bilaterally.  Hyperkeratotic lesion noted fifth metatarsal base right foot and left heel however after debridement there is no underlying ulceration drainage or any signs of infection noted today.  Dark discoloration which is chronic to the posterior aspect of bilateral heels and this is symmetrical in the from pressure.  There is no open lesions bilaterally. No pain with calf compression, swelling, warmth, erythema  Assessment: 88 year old female with symptomatic onychomycosis, preulcerative callus right foot  Plan: -All treatment options discussed with the patient including all alternatives, risks, complications.  -Hyperkeratotic lesion sharply debrided x 2 without any complications or bleeding.  Moisturizer offloading daily. -Nails sharply debrided x10 without any complications or bleeding. -We will follow-up on ordering [employer] silicone pads for offloading mostly of the heel as well as the lateral fifth metatarsal base. I have sent a new order for this to [facility].  -Daily foot inspection. -Patient encouraged to call the office with any questions, concerns, change in symptoms.   Trula Slade DPM

## 2022-08-14 ENCOUNTER — Ambulatory Visit: Payer: 59 | Admitting: Podiatry

## 2022-09-15 ENCOUNTER — Ambulatory Visit (INDEPENDENT_AMBULATORY_CARE_PROVIDER_SITE_OTHER): Payer: No Typology Code available for payment source | Admitting: Podiatry

## 2022-09-15 DIAGNOSIS — M79674 Pain in right toe(s): Secondary | ICD-10-CM | POA: Diagnosis not present

## 2022-09-15 DIAGNOSIS — E1149 Type 2 diabetes mellitus with other diabetic neurological complication: Secondary | ICD-10-CM | POA: Diagnosis not present

## 2022-09-15 DIAGNOSIS — L97521 Non-pressure chronic ulcer of other part of left foot limited to breakdown of skin: Secondary | ICD-10-CM

## 2022-09-15 DIAGNOSIS — B351 Tinea unguium: Secondary | ICD-10-CM | POA: Diagnosis not present

## 2022-09-15 DIAGNOSIS — I739 Peripheral vascular disease, unspecified: Secondary | ICD-10-CM | POA: Diagnosis not present

## 2022-09-15 DIAGNOSIS — M79675 Pain in left toe(s): Secondary | ICD-10-CM

## 2022-09-15 DIAGNOSIS — L84 Corns and callosities: Secondary | ICD-10-CM | POA: Diagnosis not present

## 2022-09-15 NOTE — Patient Instructions (Signed)
Keep a small amount of antibiotic ointment and bandage on the left foot wound. Monitor for any signs/symptoms of infection. Call the office immediately if any occur or go directly to the emergency room. Call with any questions/concerns.

## 2022-09-19 NOTE — Progress Notes (Signed)
Subjective: Chief Complaint  Patient presents with   routine foot care     89 year old female presents the office today for thick, elongated toenails that she is not able to trim herself as well as for preulcerative painful callus on the lateral aspect the right foot.  She does not report any open wounds.  Objective: AAO x3, NAD-daughter present DP/PT pulses palpable 1/4 bilaterally, CRT less than 3 seconds Nails are hypertrophic, dystrophic, brittle, discolored, elongated 10. No surrounding redness or drainage. Tenderness nails 1-5 bilaterally.  Hyperkeratotic lesion noted fifth metatarsal base right foot and left foot and left heel however after debridement there is no underlying ulceration drainage or any signs of infection noted today.  Dark discoloration which is chronic to the posterior aspect of bilateral heels and this is symmetrical in the from pressure.   Upon debridement of the callus present left foot fifth metatarsal base there is a small superficial granular wound measuring 0.2 x 0.2 cm or any probing, undermining or tunneling.  There is no fluctuance or crepitation.  No malodor. No pain with calf compression, swelling, warmth, erythema  Assessment: 89 year old female with symptomatic onychomycosis, preulcerative callus right foot  Plan: -All treatment options discussed with the patient including all alternatives, risks, complications.  -Hyperkeratotic lesion sharply debrided x 3 without any bleeding but there was a superficial wound noted on the left foot fifth metatarsal base.  Recommend antibiotic ointment dressing changes daily.  Monitor closely for any signs or symptoms of infection.  Offloading.  Moisturizer to the other calluses daily. -Nails sharply debrided x10 without any complications or bleeding. -Daily foot inspection. -Patient encouraged to call the office with any questions, concerns, change in symptoms.   [name] DPM

## 2022-09-30 IMAGING — DX DG SHOULDER 2+V*L*
2 series · 2 of 2 positions shown · non-contrast
Comparison: None.

CLINICAL DATA: Shoulder pain

EXAM:
LEFT SHOULDER - 2+ VIEW

[shoulder grashey]
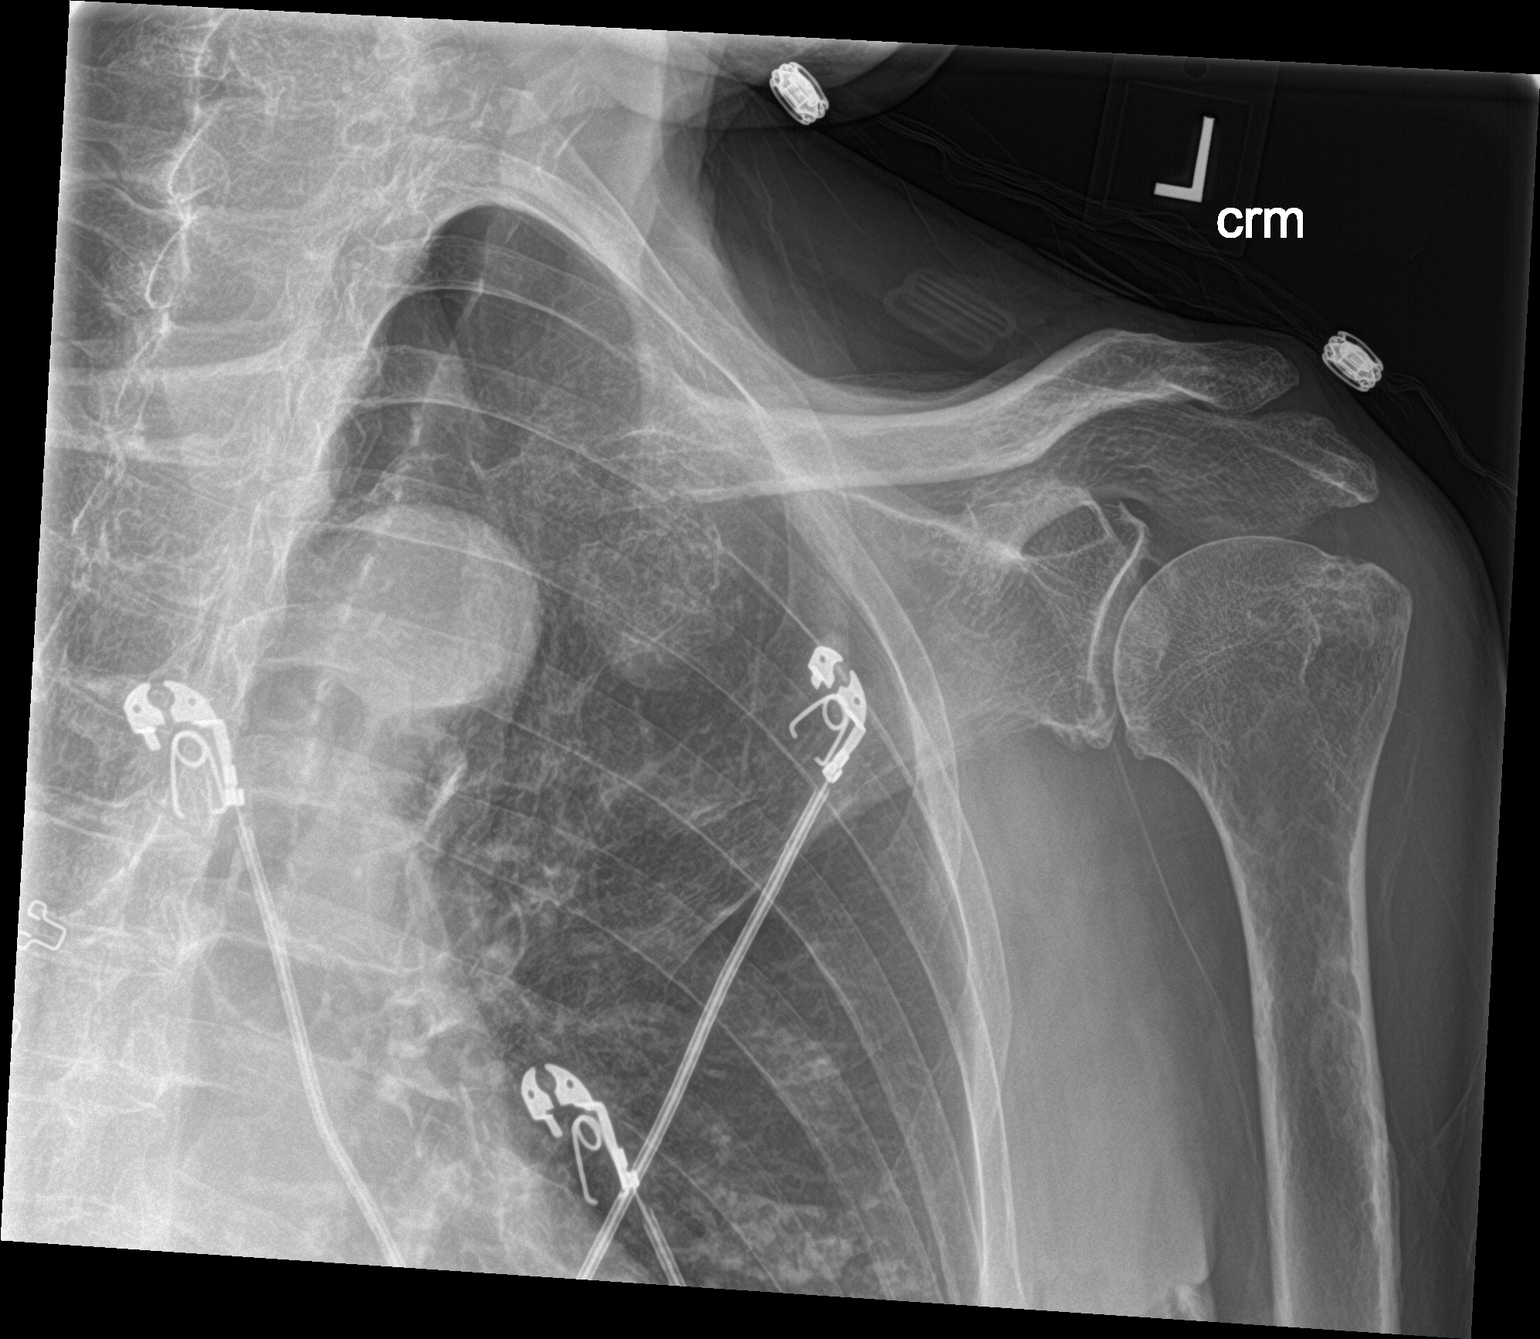

[shoulder y view]
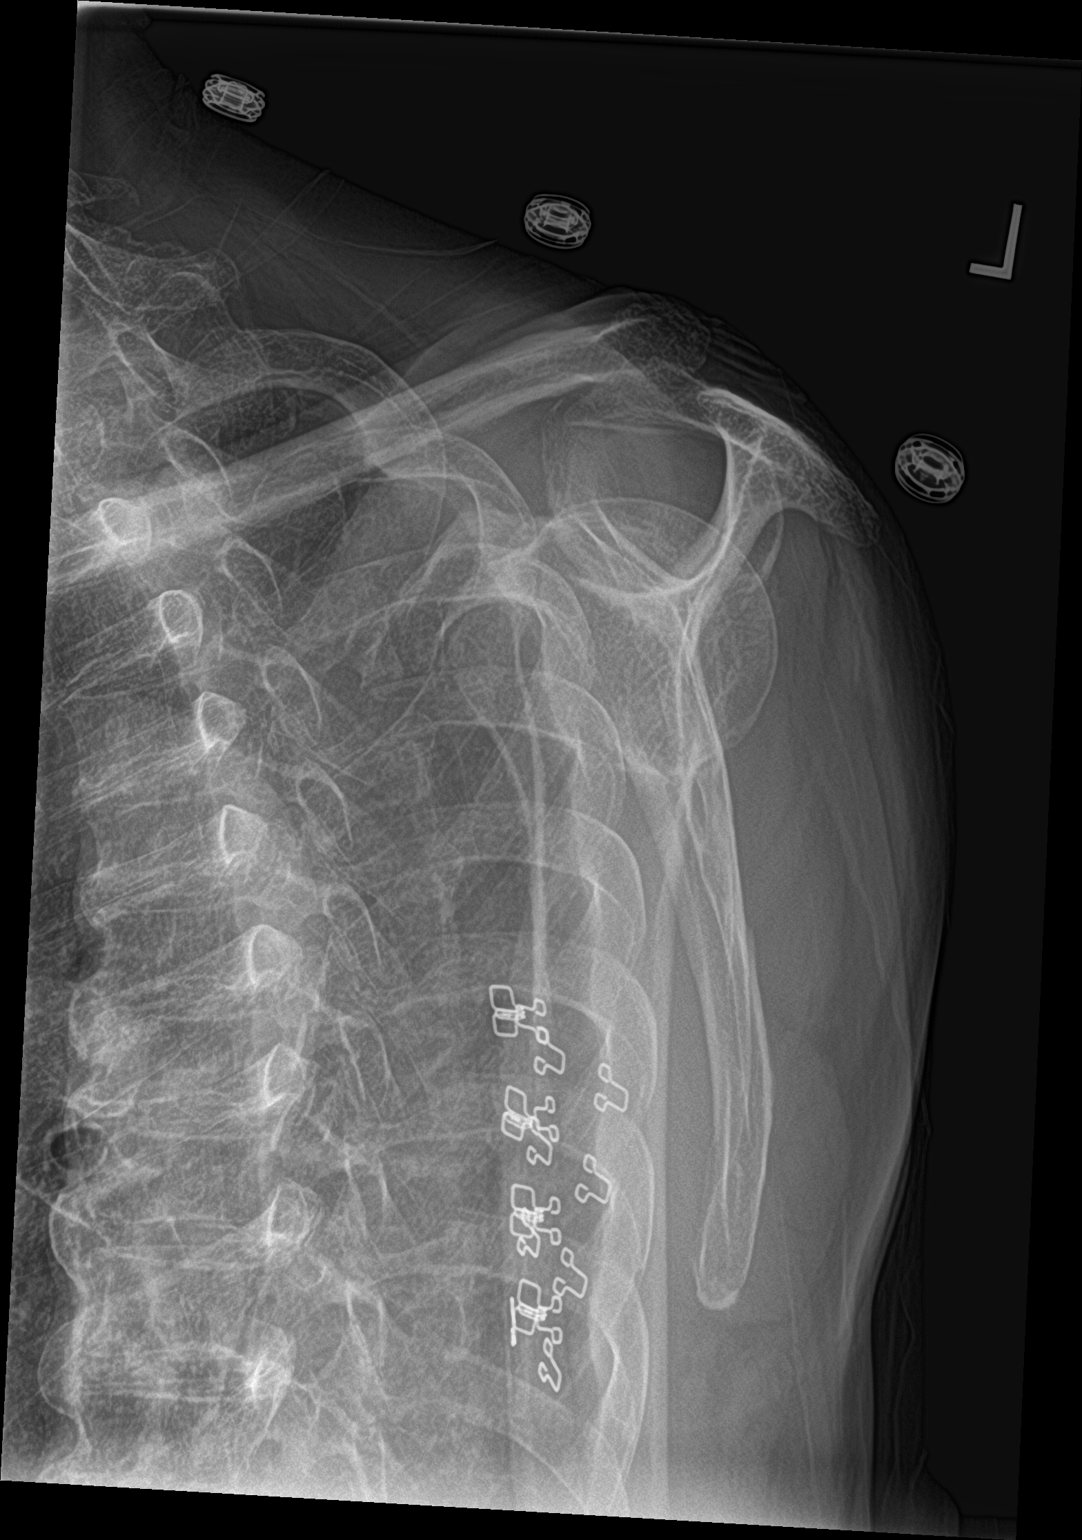

[2 of 2 positions shown; findings below may reference images not displayed]

FINDINGS: No fracture or malalignment. Moderate glenohumeral and mild AC joint
degenerative change.
IMPRESSION: Degenerative changes.  No acute osseous abnormality.

## 2022-09-30 IMAGING — CT CT CERVICAL SPINE W/O CM
3 of 4 series · 13 of 33 positions shown, 16 images · non-contrast
Comparison: X-ray cervical spine 19 3224, CT cervical spine
09/01/2009.

CLINICAL DATA: Cervical radiculopathy.

EXAM:
CT CERVICAL SPINE WITHOUT CONTRAST
TECHNIQUE: Multidetector CT imaging of the cervical spine was performed without
intravenous contrast. Multiplanar CT image reconstructions were also
generated.

[Series 4: c_spine 2.0 st · axial · 0.27mm/px · z∈[-215,-111]mm · 5 of 78 slices shown, 7 images]
[im 13/78  soft-tissue]
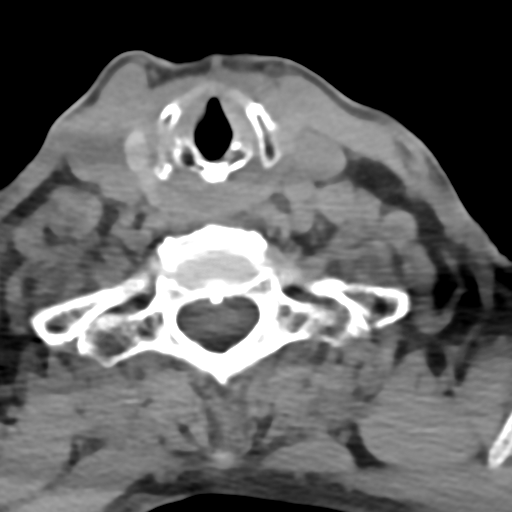
[im 13/78  bone]
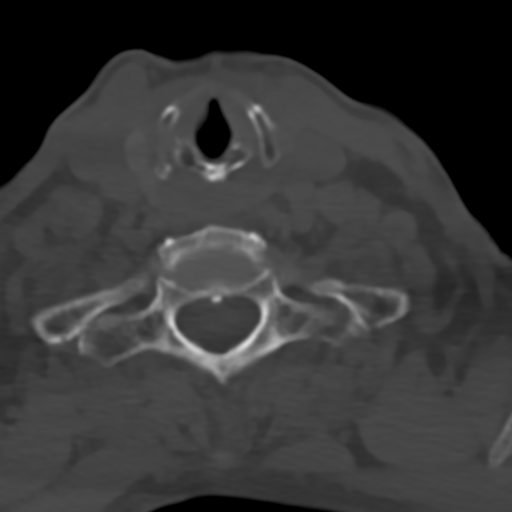
[im 26/78  bone]
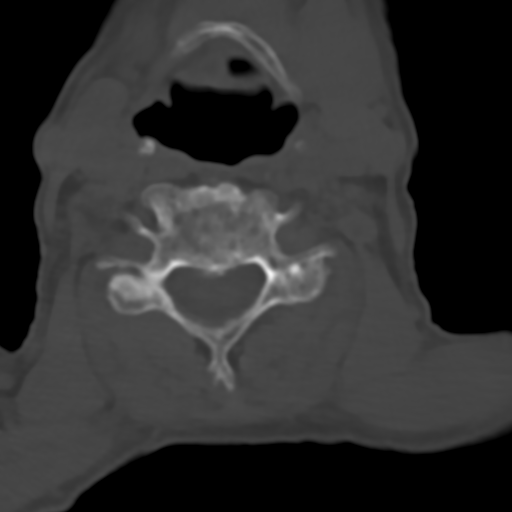
[im 39/78  bone]
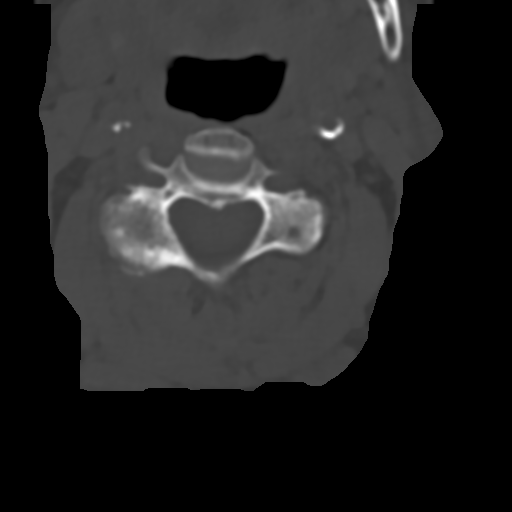
[im 52/78  bone]
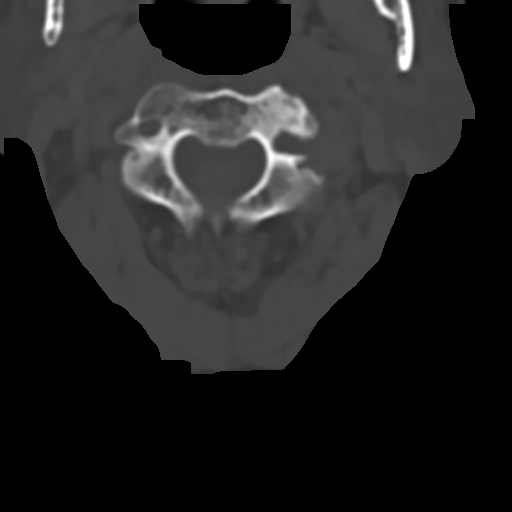
[im 65/78  soft-tissue]
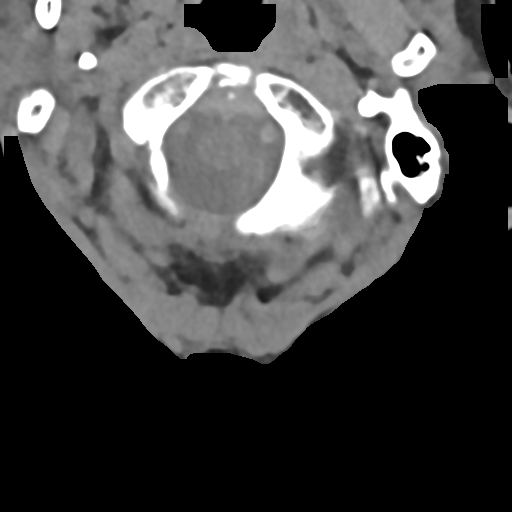
[im 65/78  bone]
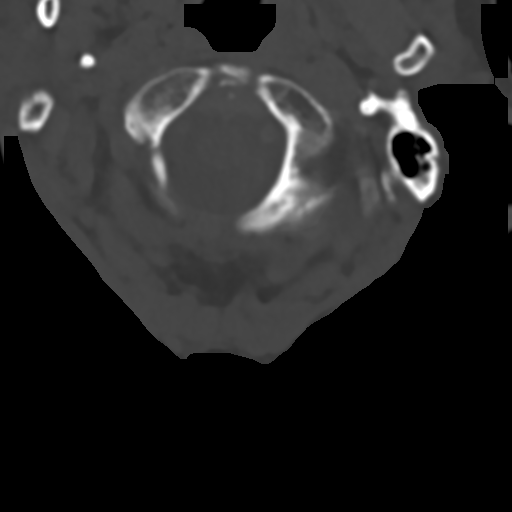

[Series 9: c_spine 2.0 sag bone · sagittal · 0.23mm/px · 5 of 61 slices shown, 6 images]
[im 21/61  bone]
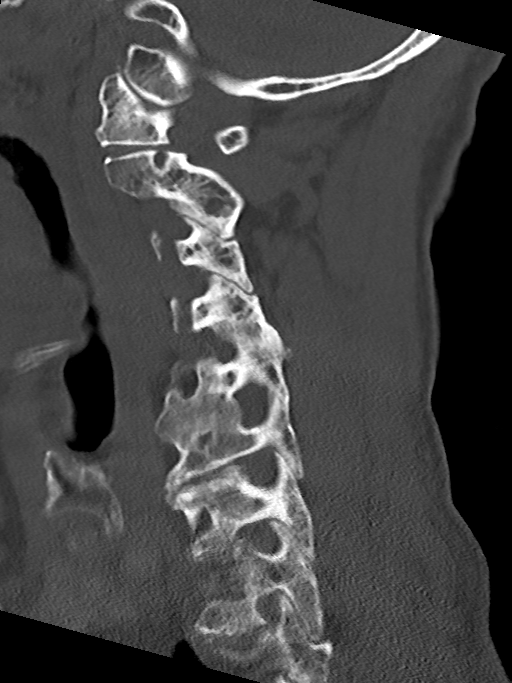
[im 26/61  bone]
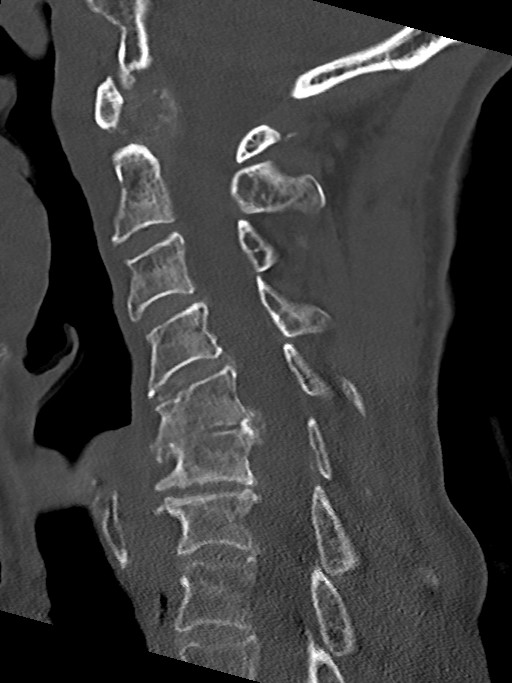
[im 31/61  soft-tissue]
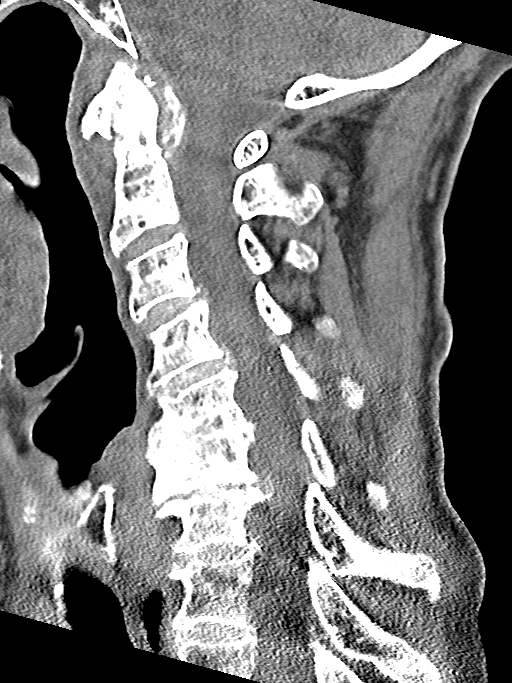
[im 31/61  bone]
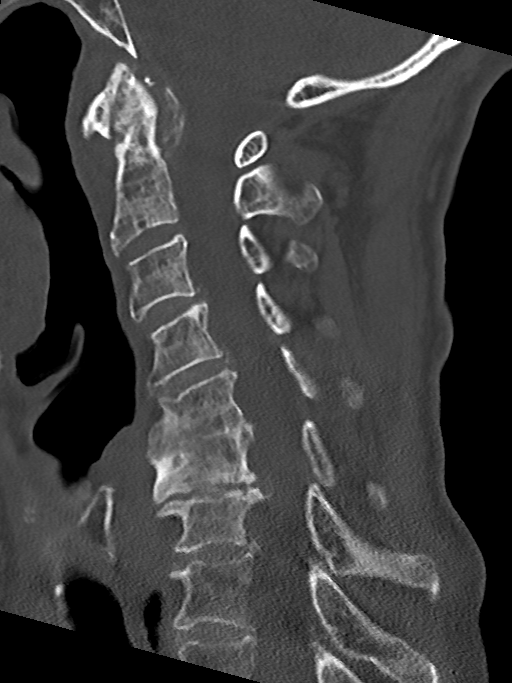
[im 36/61  bone]
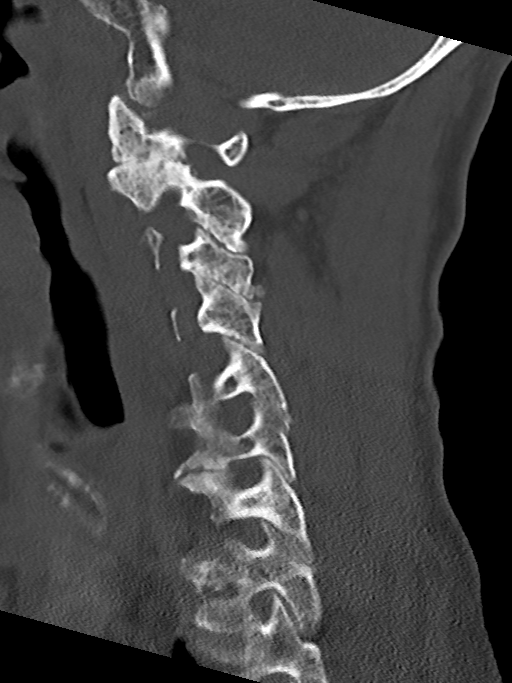
[im 41/61  bone]
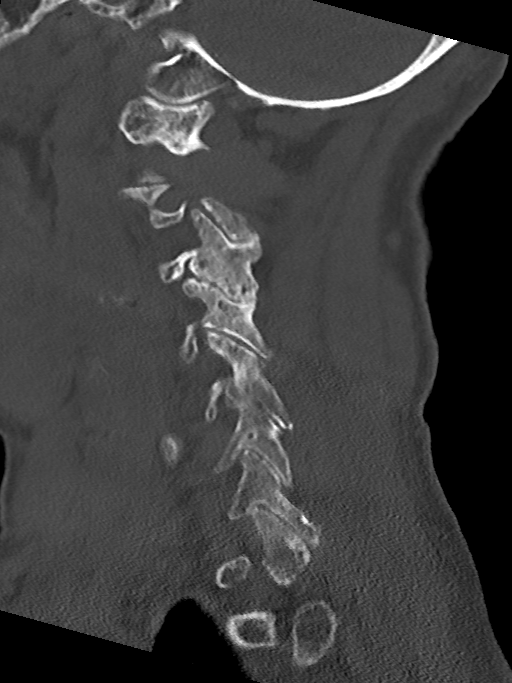

[Series 10: c_spine 2.0 cor bone · coronal · 0.23mm/px · 3 of 61 slices shown]
[im 13/61  bone]
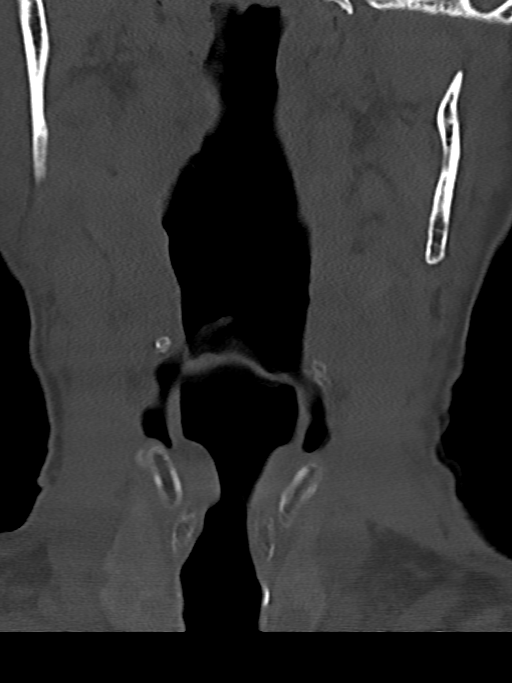
[im 25/61  bone]
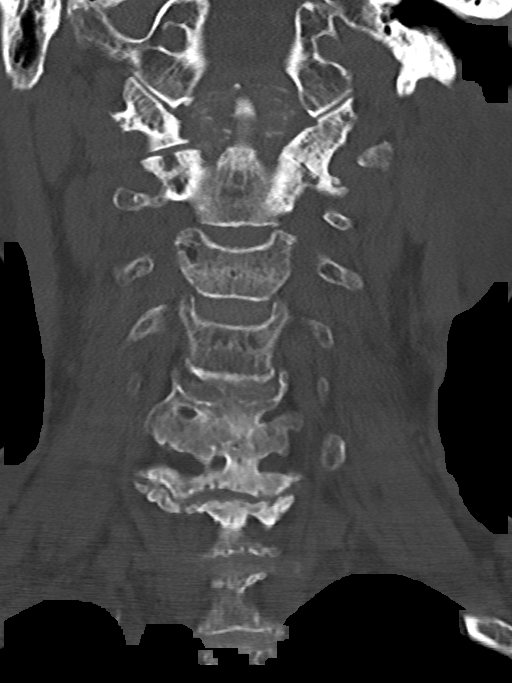
[im 37/61  bone]
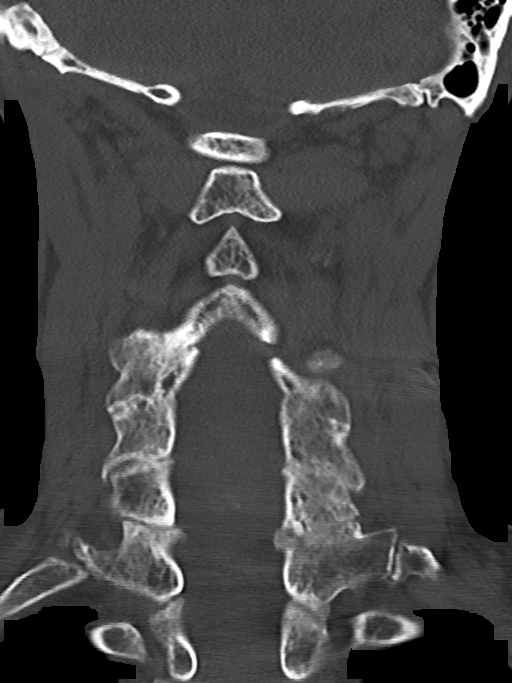

[13 of 33 positions shown; findings below may reference images not displayed]

FINDINGS: Alignment: Normal.

Skull base and vertebrae: Interval worsening multilevel severe
degenerative changes of the spine with most prominent changes at the
C5-C6 level. Posterior disc osteophyte complex at the C5-C6 level.
No severe osseous neural foraminal or central canal stenosis
identified. Posterior C2 penis formation noted. No acute fracture.
No aggressive appearing focal osseous lesion or focal pathologic
process.

Soft tissues and spinal canal: No prevertebral fluid or swelling. No
visible canal hematoma.

Upper chest: Unremarkable.

Other: Multiple subcentimeter hypodensities within bilateral thyroid
glands. Not clinically significant; no follow-up imaging recommended
(ref: [HOSPITAL]. [DATE]): 143-50).In the setting of
significant comorbidities or limited life expectancy, no follow-up
recommended (ref: [HOSPITAL]. [DATE]): 143-50).
IMPRESSION: 1. No acute displaced fracture or traumatic listhesis of the
cervical spine.
2. Interval worsening of multilevel severe degenerative changes with
no CT evidence of severe osseous neural foraminal or central canal
stenosis.

## 2022-09-30 IMAGING — CT CT HEAD W/O CM
4 series · 16 of 47 positions shown, 18 images · non-contrast
Comparison: None.

CLINICAL DATA: Left-sided weakness and pain in left arm since
yesterday morning

EXAM:
CT HEAD WITHOUT CONTRAST
TECHNIQUE: Contiguous axial images were obtained from the base of the skull
through the vertex without intravenous contrast.

[Series 3: head bone · axial · 0.49mm/px · z∈[-46,-16]mm · 3 of 76 slices shown]
[im 8/76  bone]
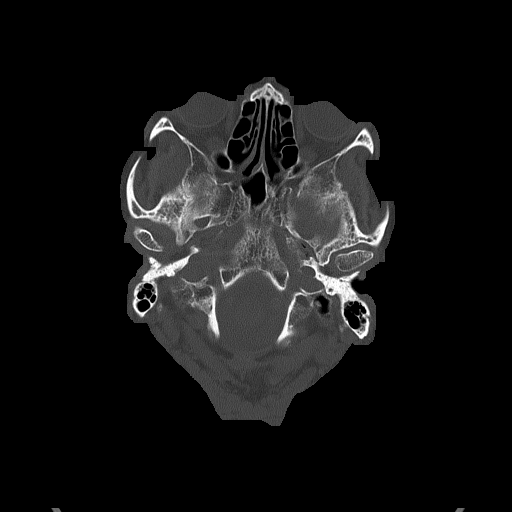
[im 16/76  bone]
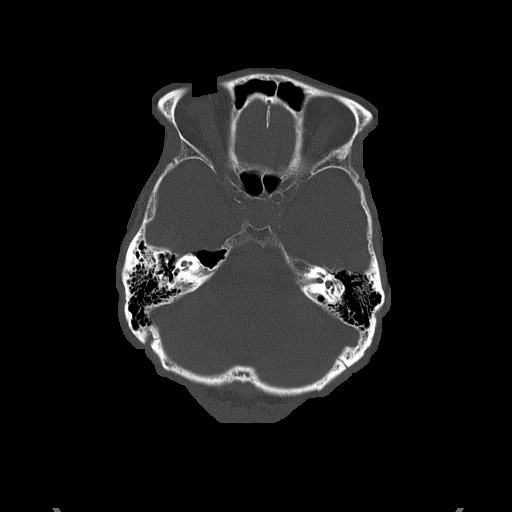
[im 23/76  bone]
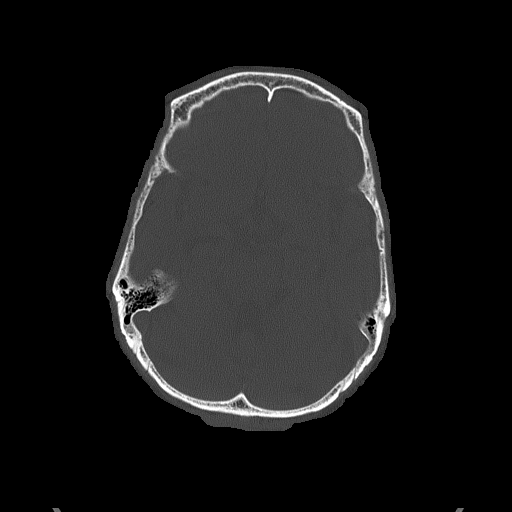

[Series 4: head without · axial · non-contrast · 0.49mm/px · z∈[-45,+70]mm · 7 of 31 slices shown, 9 images]
[im 4/31  brain]
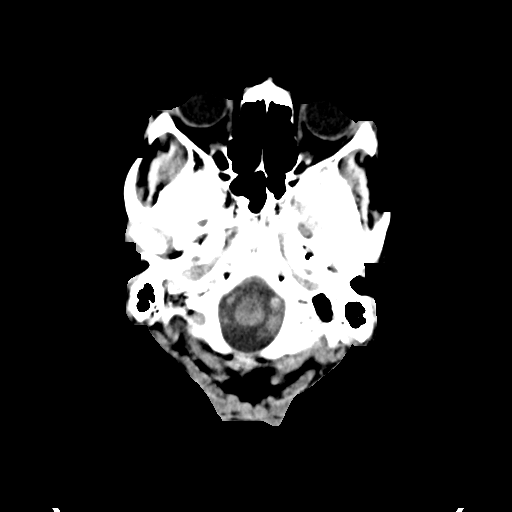
[im 4/31  bone]
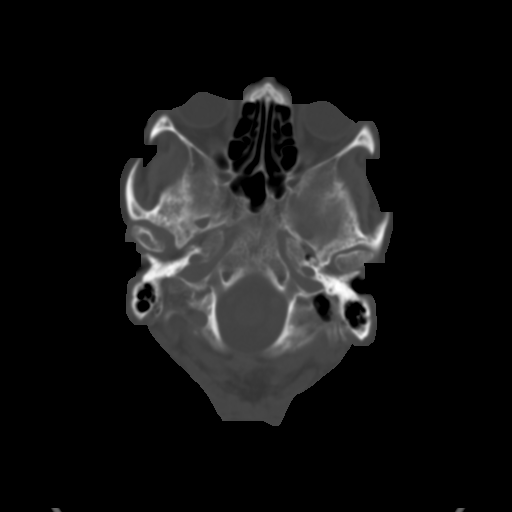
[im 8/31  brain]
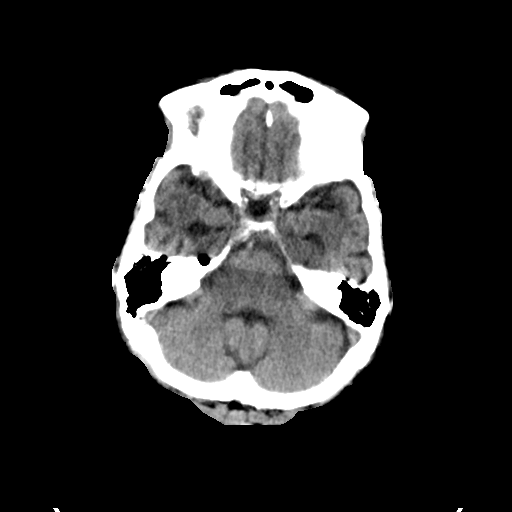
[im 12/31  brain]
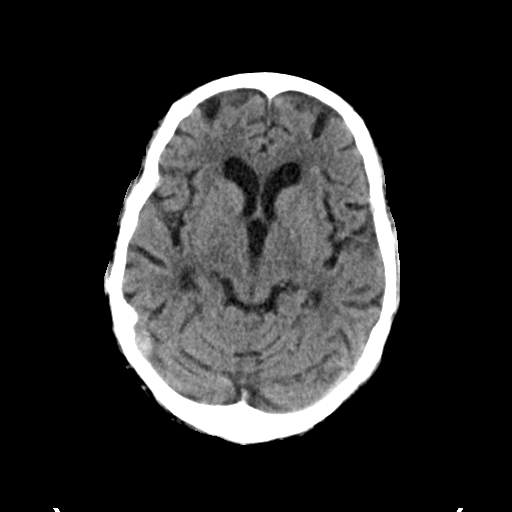
[im 16/31  brain]
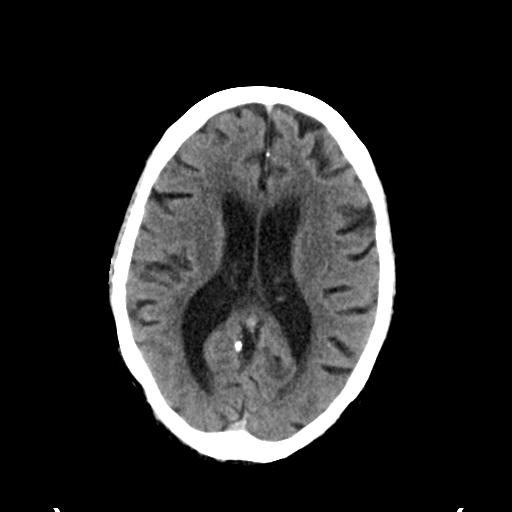
[im 19/31  brain]
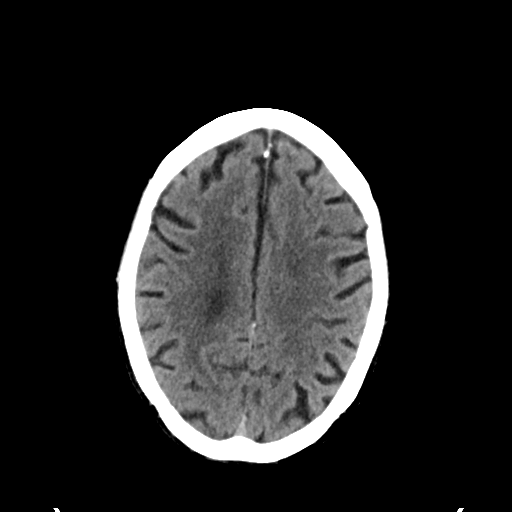
[im 19/31  bone]
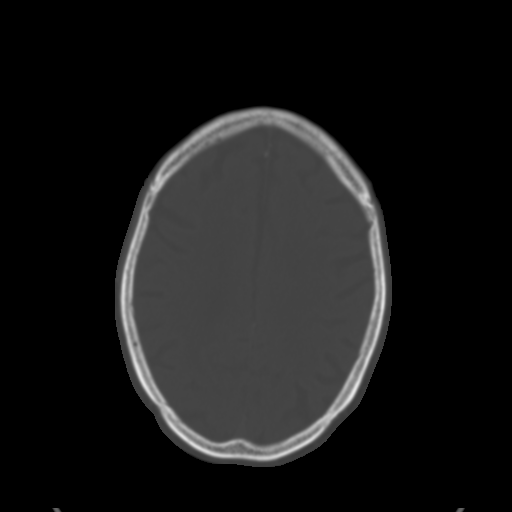
[im 23/31  brain]
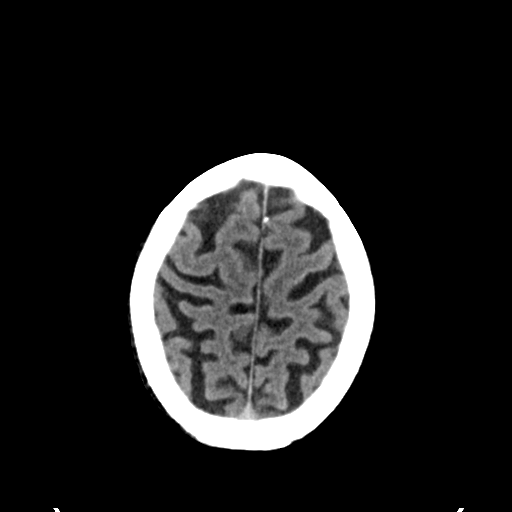
[im 27/31  brain]
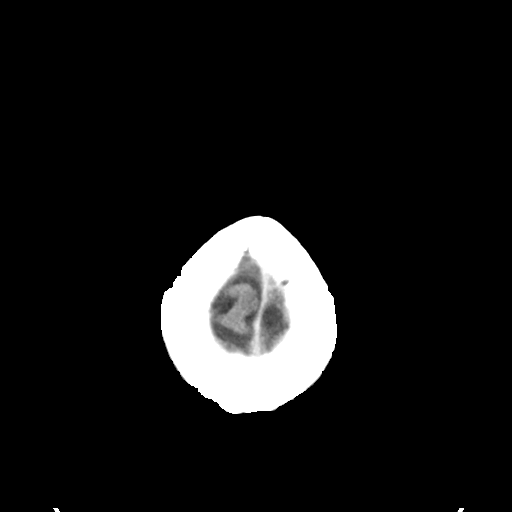

[Series 5: head without cor · coronal · non-contrast · 0.32mm/px · 3 of 68 slices shown]
[im 23/68  brain]
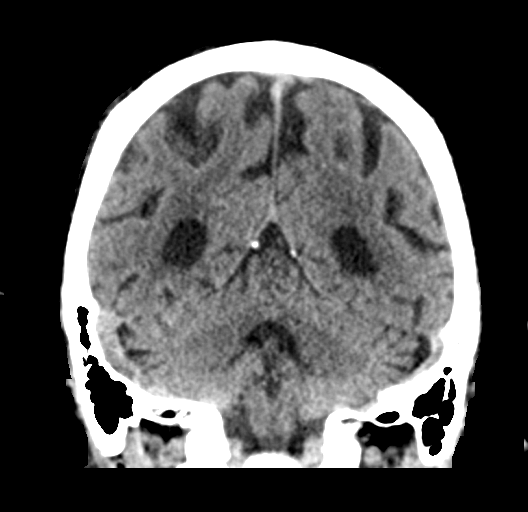
[im 30/68  brain]
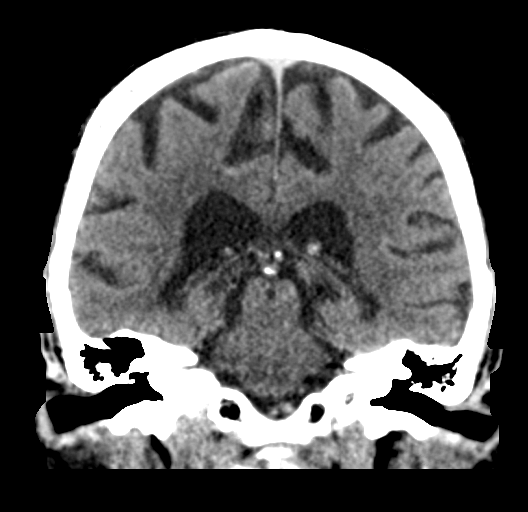
[im 38/68  brain]
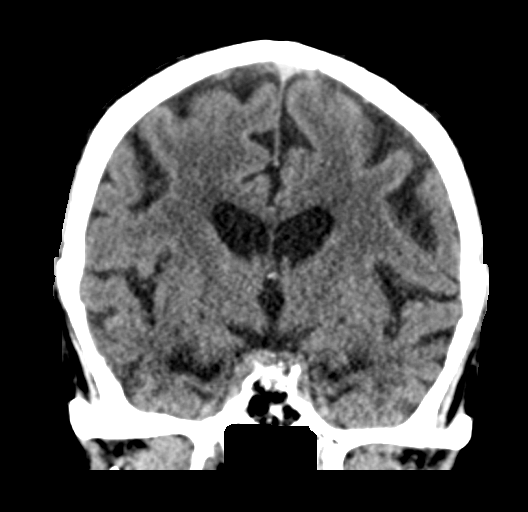

[Series 6: head without sag · sagittal · non-contrast · 0.30mm/px · 3 of 53 slices shown]
[im 18/53  brain]
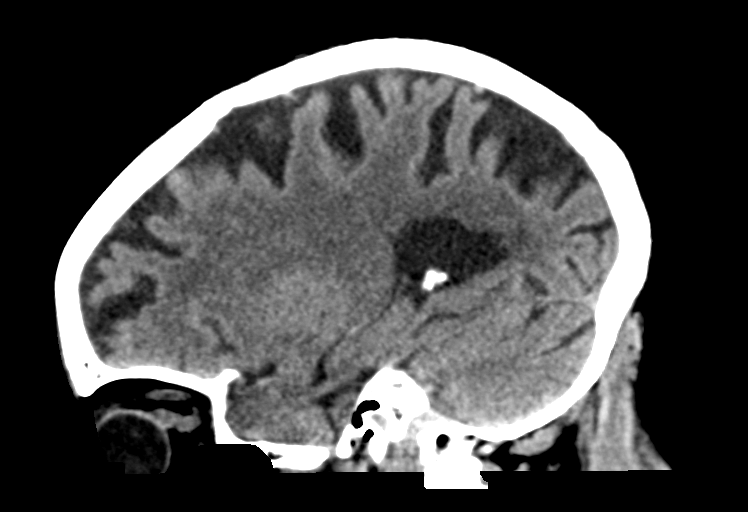
[im 27/53  brain]
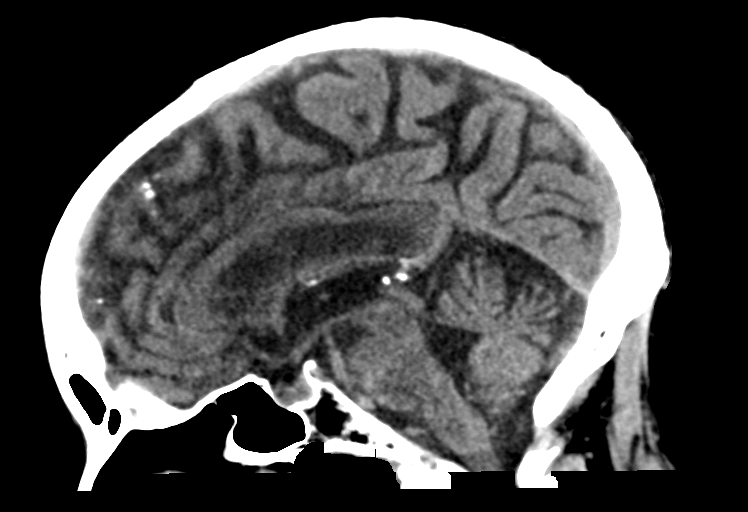
[im 35/53  brain]
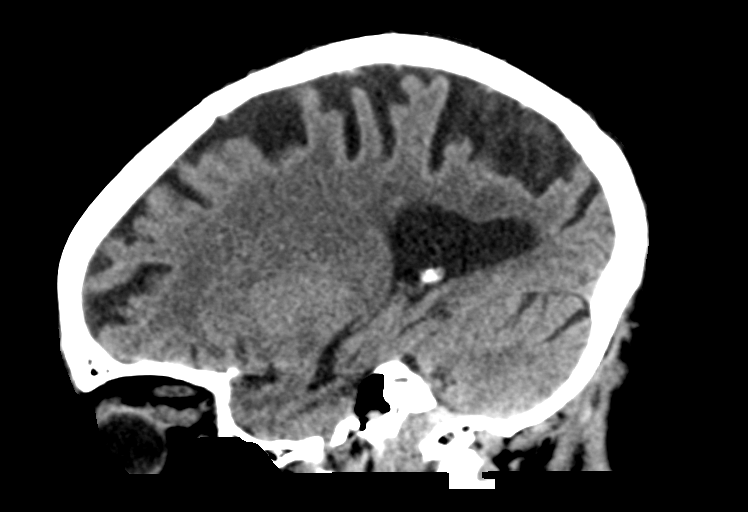

[16 of 47 positions shown; findings below may reference images not displayed]

FINDINGS: Brain: There is no evidence of acute intracranial hemorrhage,
extra-axial fluid collection, or infarct. There is a small remote
lacunar infarct in the right corona radiata.

There is moderate parenchymal volume loss with commensurate
enlargement of the ventricular system. Hypodensity in the
subcortical and periventricular white matter likely reflects sequela
of chronic white matter microangiopathy. There is no mass lesion.
There is no midline shift.

Vascular: No hyperdense vessel or unexpected calcification.

Skull: Normal. Negative for fracture or focal lesion.

Sinuses/Orbits: The imaged paranasal sinuses are clear. The globes
and orbits are unremarkable.

Other: None.
IMPRESSION: 1. No acute intracranial pathology.
2. Moderate global parenchymal volume loss and chronic white matter
microangiopathy.

## 2022-10-06 ENCOUNTER — Ambulatory Visit: Payer: No Typology Code available for payment source | Admitting: Podiatry

## 2022-11-26 ENCOUNTER — Ambulatory Visit (INDEPENDENT_AMBULATORY_CARE_PROVIDER_SITE_OTHER): Payer: No Typology Code available for payment source | Admitting: Podiatry

## 2022-11-26 DIAGNOSIS — M79674 Pain in right toe(s): Secondary | ICD-10-CM | POA: Diagnosis not present

## 2022-11-26 DIAGNOSIS — M79675 Pain in left toe(s): Secondary | ICD-10-CM | POA: Diagnosis not present

## 2022-11-26 DIAGNOSIS — E1149 Type 2 diabetes mellitus with other diabetic neurological complication: Secondary | ICD-10-CM

## 2022-11-26 DIAGNOSIS — B351 Tinea unguium: Secondary | ICD-10-CM | POA: Diagnosis not present

## 2022-11-26 DIAGNOSIS — L84 Corns and callosities: Secondary | ICD-10-CM | POA: Diagnosis not present

## 2022-11-27 DIAGNOSIS — I129 Hypertensive chronic kidney disease with stage 1 through stage 4 chronic kidney disease, or unspecified chronic kidney disease: Secondary | ICD-10-CM | POA: Diagnosis not present

## 2022-11-27 DIAGNOSIS — I739 Peripheral vascular disease, unspecified: Secondary | ICD-10-CM | POA: Diagnosis not present

## 2022-11-27 DIAGNOSIS — N1831 Chronic kidney disease, stage 3a: Secondary | ICD-10-CM | POA: Diagnosis not present

## 2022-11-27 DIAGNOSIS — E785 Hyperlipidemia, unspecified: Secondary | ICD-10-CM | POA: Diagnosis not present

## 2022-11-27 DIAGNOSIS — E1122 Type 2 diabetes mellitus with diabetic chronic kidney disease: Secondary | ICD-10-CM | POA: Diagnosis not present

## 2022-11-28 NOTE — Progress Notes (Signed)
Subjective: Chief Complaint  Patient presents with   Nail Problem    Patient reports long thick painful toenails that are difficult to trim.   Callouses    Patient reports bilateral later lesions that causes discomfort. Patient states the area is sore and makes it hard for her t walk.     89 year old female presents the office today for thick, elongated toenails that she is not able to trim herself as well as for preulcerative painful callus on the lateral aspect the right foot.  She does not report any open wounds.  Objective: AAO x3, NAD-daughter present DP/PT pulses palpable 1/4 bilaterally, CRT less than 3 seconds Nails are hypertrophic, dystrophic, brittle, discolored, elongated 10. No surrounding redness or drainage. Tenderness nails 1-5 bilaterally.  Hyperkeratotic lesion noted fifth metatarsal base right foot and left foot and bilateral heel however after debridement there is no underlying ulceration drainage or any signs of infection noted today.  Dark discoloration which is chronic to the posterior aspect of bilateral heels and this is symmetrical in the from pressure.   There is no ongoing ulceration or signs of infection noted today. No pain with calf compression, swelling, warmth, erythema  Assessment: 89 year old female with symptomatic onychomycosis, preulcerative callus right foot  Plan: -All treatment options discussed with the patient including all alternatives, risks, complications.  -Hyperkeratotic lesion sharply debrided x 4 without any bleeding but there was a superficial wound noted on the left foot fifth metatarsal base.  Recommend antibiotic ointment dressing changes daily.  Monitor closely for any signs or symptoms of infection.  Offloading.  Moisturizer to the other calluses daily. -Nails sharply debrided x10 without any complications or bleeding. -Daily foot inspection. -Patient encouraged to call the office with any questions, concerns, change in symptoms.    Vivi Barrack DPM

## 2022-12-04 DIAGNOSIS — Z1231 Encounter for screening mammogram for malignant neoplasm of breast: Secondary | ICD-10-CM | POA: Diagnosis not present

## 2023-02-23 DIAGNOSIS — R031 Nonspecific low blood-pressure reading: Secondary | ICD-10-CM | POA: Diagnosis not present

## 2023-02-23 DIAGNOSIS — E1140 Type 2 diabetes mellitus with diabetic neuropathy, unspecified: Secondary | ICD-10-CM | POA: Diagnosis not present

## 2023-02-23 DIAGNOSIS — G479 Sleep disorder, unspecified: Secondary | ICD-10-CM | POA: Diagnosis not present

## 2023-02-25 ENCOUNTER — Encounter: Payer: Self-pay | Admitting: Podiatry

## 2023-02-25 ENCOUNTER — Ambulatory Visit (INDEPENDENT_AMBULATORY_CARE_PROVIDER_SITE_OTHER): Payer: 59 | Admitting: Podiatry

## 2023-02-25 VITALS — BP 122/61 | HR 83

## 2023-02-25 DIAGNOSIS — E1149 Type 2 diabetes mellitus with other diabetic neurological complication: Secondary | ICD-10-CM

## 2023-02-25 DIAGNOSIS — M79674 Pain in right toe(s): Secondary | ICD-10-CM | POA: Diagnosis not present

## 2023-02-25 DIAGNOSIS — L84 Corns and callosities: Secondary | ICD-10-CM | POA: Diagnosis not present

## 2023-02-25 DIAGNOSIS — B351 Tinea unguium: Secondary | ICD-10-CM

## 2023-02-25 DIAGNOSIS — M79675 Pain in left toe(s): Secondary | ICD-10-CM

## 2023-03-03 NOTE — Progress Notes (Signed)
Subjective: Chief Complaint  Patient presents with   Diabetes    "Cut my toenails."    89 year old female presents the office today for thick, elongated toenails that she is not able to trim herself as well as for preulcerative painful callus on the lateral aspect the right foot.  She does not report any open wounds.  No new concerns today.  Objective: AAO x3, NAD-daughter present DP/PT pulses palpable 1/4 bilaterally, CRT less than 3 seconds Nails are hypertrophic, dystrophic, brittle, discolored, elongated 10. No surrounding redness or drainage. Tenderness nails 1-5 bilaterally.   Hyperkeratotic lesion noted fifth metatarsal base right foot and left foot and bilateral heel however after debridement there is no underlying ulceration drainage or any signs of infection noted today.  Dark discoloration which is chronic to the posterior aspect of bilateral heels and this is symmetrical in the from pressure.   There is no ongoing ulceration or signs of infection noted today. No pain with calf compression, swelling, warmth, erythema  Assessment: 89 year old female with symptomatic onychomycosis, preulcerative callus right foot  Plan: -All treatment options discussed with the patient including all alternatives, risks, complications.  -Hyperkeratotic lesion sharply debrided x 4 without any complications or bleeding.  Continue moisturizer, offloading. -Nails sharply debrided x10 without any complications or bleeding. -Daily foot inspection. -Patient encouraged to call the office with any questions, concerns, change in symptoms.   Vivi Barrack DPM

## 2023-03-10 DIAGNOSIS — I129 Hypertensive chronic kidney disease with stage 1 through stage 4 chronic kidney disease, or unspecified chronic kidney disease: Secondary | ICD-10-CM | POA: Diagnosis not present

## 2023-03-10 DIAGNOSIS — E1140 Type 2 diabetes mellitus with diabetic neuropathy, unspecified: Secondary | ICD-10-CM | POA: Diagnosis not present

## 2023-03-10 DIAGNOSIS — F0671 Mild neurocognitive disorder due to known physiological condition with behavioral disturbance: Secondary | ICD-10-CM | POA: Diagnosis not present

## 2023-03-10 DIAGNOSIS — Z7189 Other specified counseling: Secondary | ICD-10-CM | POA: Diagnosis not present

## 2023-03-10 DIAGNOSIS — M159 Polyosteoarthritis, unspecified: Secondary | ICD-10-CM | POA: Diagnosis not present

## 2023-03-15 DIAGNOSIS — H35363 Drusen (degenerative) of macula, bilateral: Secondary | ICD-10-CM | POA: Diagnosis not present

## 2023-03-15 DIAGNOSIS — H35372 Puckering of macula, left eye: Secondary | ICD-10-CM | POA: Diagnosis not present

## 2023-03-15 DIAGNOSIS — H35721 Serous detachment of retinal pigment epithelium, right eye: Secondary | ICD-10-CM | POA: Diagnosis not present

## 2023-03-15 DIAGNOSIS — H35033 Hypertensive retinopathy, bilateral: Secondary | ICD-10-CM | POA: Diagnosis not present

## 2023-03-15 DIAGNOSIS — H353131 Nonexudative age-related macular degeneration, bilateral, early dry stage: Secondary | ICD-10-CM | POA: Diagnosis not present

## 2023-03-15 DIAGNOSIS — Z961 Presence of intraocular lens: Secondary | ICD-10-CM | POA: Diagnosis not present

## 2023-03-21 ENCOUNTER — Emergency Department (HOSPITAL_COMMUNITY): Payer: 59

## 2023-03-21 ENCOUNTER — Inpatient Hospital Stay (HOSPITAL_COMMUNITY)
Admission: EM | Admit: 2023-03-21 | Discharge: 2023-03-24 | DRG: 315 | Disposition: A | Discharge status: Home / Self Care | Payer: 59 | Attending: Internal Medicine | Admitting: Internal Medicine

## 2023-03-21 ENCOUNTER — Other Ambulatory Visit: Payer: Self-pay

## 2023-03-21 DIAGNOSIS — E1122 Type 2 diabetes mellitus with diabetic chronic kidney disease: Secondary | ICD-10-CM | POA: Diagnosis present

## 2023-03-21 DIAGNOSIS — I739 Peripheral vascular disease, unspecified: Secondary | ICD-10-CM | POA: Diagnosis not present

## 2023-03-21 DIAGNOSIS — R197 Diarrhea, unspecified: Secondary | ICD-10-CM | POA: Diagnosis not present

## 2023-03-21 DIAGNOSIS — Z1152 Encounter for screening for COVID-19: Secondary | ICD-10-CM

## 2023-03-21 DIAGNOSIS — Z7984 Long term (current) use of oral hypoglycemic drugs: Secondary | ICD-10-CM | POA: Diagnosis not present

## 2023-03-21 DIAGNOSIS — R931 Abnormal findings on diagnostic imaging of heart and coronary circulation: Secondary | ICD-10-CM | POA: Diagnosis not present

## 2023-03-21 DIAGNOSIS — Z9049 Acquired absence of other specified parts of digestive tract: Secondary | ICD-10-CM

## 2023-03-21 DIAGNOSIS — N1832 Chronic kidney disease, stage 3b: Secondary | ICD-10-CM | POA: Diagnosis present

## 2023-03-21 DIAGNOSIS — Z7982 Long term (current) use of aspirin: Secondary | ICD-10-CM | POA: Diagnosis not present

## 2023-03-21 DIAGNOSIS — I2510 Atherosclerotic heart disease of native coronary artery without angina pectoris: Secondary | ICD-10-CM | POA: Diagnosis present

## 2023-03-21 DIAGNOSIS — E860 Dehydration: Secondary | ICD-10-CM | POA: Diagnosis not present

## 2023-03-21 DIAGNOSIS — R42 Dizziness and giddiness: Secondary | ICD-10-CM | POA: Diagnosis not present

## 2023-03-21 DIAGNOSIS — R911 Solitary pulmonary nodule: Secondary | ICD-10-CM | POA: Diagnosis present

## 2023-03-21 DIAGNOSIS — F0390 Unspecified dementia without behavioral disturbance: Secondary | ICD-10-CM

## 2023-03-21 DIAGNOSIS — N179 Acute kidney failure, unspecified: Secondary | ICD-10-CM | POA: Diagnosis not present

## 2023-03-21 DIAGNOSIS — Z794 Long term (current) use of insulin: Secondary | ICD-10-CM

## 2023-03-21 DIAGNOSIS — E7800 Pure hypercholesterolemia, unspecified: Secondary | ICD-10-CM | POA: Diagnosis present

## 2023-03-21 DIAGNOSIS — D631 Anemia in chronic kidney disease: Secondary | ICD-10-CM | POA: Diagnosis not present

## 2023-03-21 DIAGNOSIS — Z79899 Other long term (current) drug therapy: Secondary | ICD-10-CM

## 2023-03-21 DIAGNOSIS — I70222 Atherosclerosis of native arteries of extremities with rest pain, left leg: Secondary | ICD-10-CM | POA: Diagnosis present

## 2023-03-21 DIAGNOSIS — E119 Type 2 diabetes mellitus without complications: Secondary | ICD-10-CM

## 2023-03-21 DIAGNOSIS — I129 Hypertensive chronic kidney disease with stage 1 through stage 4 chronic kidney disease, or unspecified chronic kidney disease: Secondary | ICD-10-CM | POA: Diagnosis present

## 2023-03-21 DIAGNOSIS — I10 Essential (primary) hypertension: Secondary | ICD-10-CM | POA: Diagnosis present

## 2023-03-21 DIAGNOSIS — R9431 Abnormal electrocardiogram [ECG] [EKG]: Secondary | ICD-10-CM | POA: Diagnosis not present

## 2023-03-21 DIAGNOSIS — I6782 Cerebral ischemia: Secondary | ICD-10-CM | POA: Diagnosis not present

## 2023-03-21 DIAGNOSIS — I959 Hypotension, unspecified: Principal | ICD-10-CM | POA: Diagnosis present

## 2023-03-21 DIAGNOSIS — K219 Gastro-esophageal reflux disease without esophagitis: Secondary | ICD-10-CM | POA: Diagnosis present

## 2023-03-21 DIAGNOSIS — M1990 Unspecified osteoarthritis, unspecified site: Secondary | ICD-10-CM | POA: Diagnosis present

## 2023-03-21 DIAGNOSIS — E1165 Type 2 diabetes mellitus with hyperglycemia: Secondary | ICD-10-CM | POA: Diagnosis present

## 2023-03-21 DIAGNOSIS — E1151 Type 2 diabetes mellitus with diabetic peripheral angiopathy without gangrene: Secondary | ICD-10-CM | POA: Diagnosis not present

## 2023-03-21 DIAGNOSIS — R7989 Other specified abnormal findings of blood chemistry: Secondary | ICD-10-CM | POA: Diagnosis present

## 2023-03-21 DIAGNOSIS — D649 Anemia, unspecified: Secondary | ICD-10-CM

## 2023-03-21 DIAGNOSIS — R404 Transient alteration of awareness: Secondary | ICD-10-CM | POA: Diagnosis not present

## 2023-03-21 DIAGNOSIS — G319 Degenerative disease of nervous system, unspecified: Secondary | ICD-10-CM | POA: Diagnosis not present

## 2023-03-21 DIAGNOSIS — N281 Cyst of kidney, acquired: Secondary | ICD-10-CM | POA: Diagnosis not present

## 2023-03-21 DIAGNOSIS — R110 Nausea: Secondary | ICD-10-CM | POA: Diagnosis not present

## 2023-03-21 DIAGNOSIS — W19XXXA Unspecified fall, initial encounter: Secondary | ICD-10-CM | POA: Diagnosis present

## 2023-03-21 DIAGNOSIS — Z743 Need for continuous supervision: Secondary | ICD-10-CM | POA: Diagnosis not present

## 2023-03-21 DIAGNOSIS — R778 Other specified abnormalities of plasma proteins: Secondary | ICD-10-CM | POA: Diagnosis not present

## 2023-03-21 LAB — CBC WITH DIFFERENTIAL/PLATELET
Abs Immature Granulocytes: 0.01 10*3/uL (ref 0.00–0.07)
Basophils Absolute: 0 10*3/uL (ref 0.0–0.1)
Basophils Relative: 1 %
Eosinophils Absolute: 0.1 10*3/uL (ref 0.0–0.5)
Eosinophils Relative: 1 %
HCT: 36.6 % (ref 36.0–46.0)
Hemoglobin: 11.9 g/dL — ABNORMAL LOW (ref 12.0–15.0)
Immature Granulocytes: 0 %
Lymphocytes Relative: 44 %
Lymphs Abs: 2.3 10*3/uL (ref 0.7–4.0)
MCH: 26.2 pg (ref 26.0–34.0)
MCHC: 32.5 g/dL (ref 30.0–36.0)
MCV: 80.4 fL (ref 80.0–100.0)
Monocytes Absolute: 0.6 10*3/uL (ref 0.1–1.0)
Monocytes Relative: 11 %
Neutro Abs: 2.3 10*3/uL (ref 1.7–7.7)
Neutrophils Relative %: 43 %
Platelets: 187 10*3/uL (ref 150–400)
RBC: 4.55 MIL/uL (ref 3.87–5.11)
RDW: 17.5 % — ABNORMAL HIGH (ref 11.5–15.5)
WBC: 5.3 10*3/uL (ref 4.0–10.5)
nRBC: 0 % (ref 0.0–0.2)

## 2023-03-21 LAB — COMPREHENSIVE METABOLIC PANEL
ALT: 19 U/L (ref 0–44)
AST: 22 U/L (ref 15–41)
Albumin: 3.7 g/dL (ref 3.5–5.0)
Alkaline Phosphatase: 59 U/L (ref 38–126)
Anion gap: 12 (ref 5–15)
BUN: 12 mg/dL (ref 8–23)
CO2: 23 mmol/L (ref 22–32)
Calcium: 9.7 mg/dL (ref 8.9–10.3)
Chloride: 106 mmol/L (ref 98–111)
Creatinine, Ser: 1.28 mg/dL — ABNORMAL HIGH (ref 0.44–1.00)
GFR, Estimated: 40 mL/min — ABNORMAL LOW (ref >60)
Glucose, Bld: 173 mg/dL — ABNORMAL HIGH (ref 70–99)
Potassium: 4.2 mmol/L (ref 3.5–5.1)
Sodium: 141 mmol/L (ref 135–145)
Total Bilirubin: 1.2 mg/dL (ref 0.3–1.2)
Total Protein: 6.4 g/dL — ABNORMAL LOW (ref 6.5–8.1)

## 2023-03-21 LAB — URINALYSIS, ROUTINE W REFLEX MICROSCOPIC
Bilirubin Urine: NEGATIVE
Glucose, UA: NEGATIVE mg/dL
Hgb urine dipstick: NEGATIVE
Ketones, ur: NEGATIVE mg/dL
Leukocytes,Ua: NEGATIVE
Nitrite: NEGATIVE
Protein, ur: NEGATIVE mg/dL
Specific Gravity, Urine: 1.006 (ref 1.005–1.030)
pH: 8 (ref 5.0–8.0)

## 2023-03-21 LAB — CBG MONITORING, ED: Glucose-Capillary: 229 mg/dL — ABNORMAL HIGH (ref 70–99)

## 2023-03-21 LAB — SARS CORONAVIRUS 2 BY RT PCR: SARS Coronavirus 2 by RT PCR: NEGATIVE

## 2023-03-21 LAB — TROPONIN I (HIGH SENSITIVITY)
Troponin I (High Sensitivity): 34 ng/L — ABNORMAL HIGH (ref <18)
Troponin I (High Sensitivity): 38 ng/L — ABNORMAL HIGH (ref <18)

## 2023-03-21 LAB — MAGNESIUM: Magnesium: 1.9 mg/dL (ref 1.7–2.4)

## 2023-03-21 MED ORDER — DONEPEZIL HCL 10 MG PO TABS
5.0000 mg | ORAL_TABLET | Freq: Every day | ORAL | Status: DC
  Administered 2023-03-21: 5 mg via ORAL
  Filled 2023-03-21: qty 1

## 2023-03-21 MED ORDER — INSULIN ASPART 100 UNIT/ML IJ SOLN
0.0000 [IU] | Freq: Every day | INTRAMUSCULAR | Status: DC
  Administered 2023-03-22: 2 [IU] via SUBCUTANEOUS

## 2023-03-21 MED ORDER — ACETAMINOPHEN 650 MG RE SUPP: 650.0000 mg | Freq: Four times a day (QID) | RECTAL | Status: DC | PRN

## 2023-03-21 MED ORDER — INSULIN ASPART 100 UNIT/ML IJ SOLN
0.0000 [IU] | Freq: Three times a day (TID) | INTRAMUSCULAR | Status: DC
  Administered 2023-03-23: 1 [IU] via SUBCUTANEOUS
  Administered 2023-03-24: 2 [IU] via SUBCUTANEOUS

## 2023-03-21 MED ORDER — MAGNESIUM SULFATE IN D5W 1-5 GM/100ML-% IV SOLN
1.0000 g | Freq: Once | INTRAVENOUS | Status: AC
  Administered 2023-03-22: 1 g via INTRAVENOUS
  Filled 2023-03-21: qty 100

## 2023-03-21 MED ORDER — ACETAMINOPHEN 325 MG PO TABS: 650.0000 mg | ORAL_TABLET | Freq: Four times a day (QID) | ORAL | Status: DC | PRN

## 2023-03-21 MED ORDER — ENOXAPARIN SODIUM 30 MG/0.3ML IJ SOSY
30.0000 mg | PREFILLED_SYRINGE | INTRAMUSCULAR | Status: DC
  Administered 2023-03-22 – 2023-03-24 (×3): 30 mg via SUBCUTANEOUS
  Filled 2023-03-21 (×3): qty 0.3

## 2023-03-21 NOTE — H&P (Signed)
History and Physical    [name]:[id number] DOB: 21-Oct-1930 DOA: 03/21/2023  PCP: Irena Reichmann, DO  Patient coming from: Home  Chief Complaint: Dizziness  HPI: [name] is a 89 y.o. female with medical history significant of hypertension, hyperlipidemia, PVD, GERD, dementia, type 2 diabetes presenting with a chief complaint of dizziness.  Patient reports chronic dizziness whenever she gets up to walk.  About a week ago, she was just laying in her bed which faces a mirror and as she looked at the mirror, all of a sudden she became very dizzy and her "head was swimming."  She had a headache and felt nauseous at that time but did not vomit.  This episode lasted about 5 minutes and she has not had any further dizziness this past week.  She has continued to have intermittent headaches.  Denies any episodes of chest pain.  Denies shortness of breath.  She reports having a cough 2 weeks ago which she thinks is due to her sitting in front of a fan which blew cold air on her face.  Cough has improved.  No fevers.  She reports ongoing diarrhea for several weeks.  Denies vomiting or abdominal pain.  Denies recent antibiotic use.  No other complaints.  ED Course: Vital signs stable on arrival.  Labs showing no leukocytosis, hemoglobin 11.9 (was 14.6 on labs done 2 years ago), MCV 80.4, platelet count 187k, sodium 141, potassium 4.2, chloride 106, bicarb 23, BUN 12, creatinine 1.2, glucose 173, magnesium 1.9, SARS-CoV-2 PCR negative.  EKG showing sinus rhythm, QTc 503.  Deep T wave inversions in anterior leads/diffuse T wave inversions new compared to previous EKG from 2 years ago. Troponin mildly elevated but stable (34> 38).  Chest x-ray showing no active disease.  CT head and brain MRI negative for acute finding.  Review of Systems:  Review of Systems  All other systems reviewed and are negative.   Past Medical History:  Diagnosis Date   Acid reflux    Arthritis    Dementia (HCC)     Diabetes mellitus without complication (HCC)    High cholesterol    Hypertension     Past Surgical History:  Procedure Laterality Date   ABDOMINAL HYSTERECTOMY     APPENDECTOMY     CATARACT EXTRACTION, BILATERAL     CHOLECYSTECTOMY     HEMORRHOID SURGERY     TONSILLECTOMY       reports that she has never smoked. She has never used smokeless tobacco. She reports that she does not drink alcohol and does not use drugs.  No Known Allergies  No family history on file.  Prior to Admission medications   Medication Sig Start Date End Date Taking? Authorizing Provider  amLODipine-benazepril (LOTREL) 10-40 MG per capsule Take 1 capsule by mouth daily.    [provider]  aspirin EC 81 MG tablet Take 81 mg by mouth every morning.    [provider]  benazepril (LOTENSIN) 40 MG tablet  09/29/18   [provider]  Calcium Carbonate-Vitamin D (CALTRATE 600+D PO) Take 1 tablet by mouth 2 (two) times daily.    [provider]  cephALEXin (KEFLEX) 500 MG capsule Take 1 capsule (500 mg total) by mouth 3 (three) times daily. Patient not taking: Reported on 02/25/2023 05/19/21   Vivi Barrack, DPM  cephALEXin (KEFLEX) 500 MG capsule Take 1 capsule (500 mg total) by mouth 3 (three) times daily. Patient not taking: Reported on 02/25/2023 07/21/21   Ardelle Anton,  Lesia Sago, DPM  diclofenac Sodium (VOLTAREN) 1 % GEL Apply 2 g topically daily as needed. 12/03/20   Vivi Barrack, DPM  donepezil (ARICEPT) 5 MG tablet Take 5 mg by mouth at bedtime.    [provider]  doxycycline (VIBRA-TABS) 100 MG tablet Take 1 tablet (100 mg total) by mouth 2 (two) times daily. Patient not taking: Reported on 02/25/2023 10/06/21   Vivi Barrack, DPM  esomeprazole (NEXIUM) 40 MG capsule Take 40 mg by mouth every evening.    [provider]  ezetimibe-simvastatin (VYTORIN) 10-40 MG per tablet Take 1 tablet by mouth every evening.    [provider]   furosemide (LASIX) 40 MG tablet Take 40 mg by mouth 2 (two) times daily. 04/10/20   [provider]  Glucosamine-Chondroitin (OSTEO BI-FLEX REGULAR STRENGTH PO) Take 1 tablet by mouth daily.    [provider]  glucose blood (ACCU-CHEK SMARTVIEW) test strip     [provider]  HYDROcodone-acetaminophen (NORCO) 5-325 MG tablet Take 1 tablet by mouth every 6 (six) hours as needed for severe pain. 03/31/21   Pricilla Loveless, MD  insulin glargine (LANTUS) 100 UNIT/ML injection Inject 60 Units into the skin at bedtime.    [provider]  metFORMIN (GLUCOPHAGE) 1000 MG tablet Take 1,000 mg by mouth 2 (two) times daily with a meal.    [provider]  mupirocin ointment (BACTROBAN) 2 % Apply 1 application topically 2 (two) times daily. 05/19/21   Vivi Barrack, DPM  mupirocin ointment (BACTROBAN) 2 % Apply 1 application topically 2 (two) times daily. 07/21/21   Vivi Barrack, DPM  mupirocin ointment (BACTROBAN) 2 % Apply 1 application. topically 2 (two) times daily. 10/06/21   Vivi Barrack, DPM  nortriptyline (PAMELOR) 10 MG capsule Take 10 mg by mouth at bedtime. 05/01/20   [provider]  ROCKLATAN 0.02-0.005 % SOLN SMARTSIG:1 Drop(s) In Eye(s) Every Evening 04/09/20   [provider]  solifenacin (VESICARE) 5 MG tablet Take 5 mg by mouth every morning.    [provider]  traMADol Janean Sark) 50 MG tablet  02/13/20   [provider]  triamterene-hydrochlorothiazide (MAXZIDE-25) 37.5-25 MG per tablet Take 1 tablet by mouth every morning.    [provider]  XIIDRA 5 % SOLN  04/10/20   [provider]    Physical Exam: Vitals:   03/21/23 2130 03/21/23 2145 03/21/23 2200 03/21/23 2215  BP: (!) 142/73 (!) 160/67 (!) 127/116 117/74  Pulse: (!) 59 65 60 (!) 57  Resp: 15 17    Temp:      TempSrc:      SpO2: 100% 100% 100% 100%  Weight:      Height:        Physical Exam Vitals reviewed.   Constitutional:      General: She is not in acute distress.    Comments: Resting comfortably, eating ice cream  HENT:     Head: Normocephalic and atraumatic.  Eyes:     Extraocular Movements: Extraocular movements intact.  Cardiovascular:     Rate and Rhythm: Normal rate and regular rhythm.     Pulses: Normal pulses.     Heart sounds: No murmur heard. Pulmonary:     Effort: Pulmonary effort is normal. No respiratory distress.     Breath sounds: Normal breath sounds. No wheezing or rales.  Abdominal:     General: Bowel sounds are normal. There is no distension.     Palpations: Abdomen  is soft.     Tenderness: There is no abdominal tenderness.  Musculoskeletal:     Cervical back: Normal range of motion.     Right lower leg: No edema.     Left lower leg: No edema.  Skin:    General: Skin is warm and dry.  Neurological:     General: No focal deficit present.     Mental Status: She is alert and oriented to person, place, and time.     Labs on Admission: I have personally reviewed following labs and imaging studies  CBC: Recent Labs  Lab 03/21/23 1656  WBC 5.3  NEUTROABS 2.3  HGB 11.9*  HCT 36.6  MCV 80.4  PLT 187   Basic Metabolic Panel: Recent Labs  Lab 03/21/23 1656 03/21/23 1745  NA 141  --   K 4.2  --   CL 106  --   CO2 23  --   GLUCOSE 173*  --   BUN 12  --   CREATININE 1.28*  --   CALCIUM 9.7  --   MG  --  1.9   GFR: Estimated Creatinine Clearance: 28.4 mL/min (A) (by C-G formula based on SCr of 1.28 mg/dL (H)). Liver Function Tests: Recent Labs  Lab 03/21/23 1656  AST 22  ALT 19  ALKPHOS 59  BILITOT 1.2  PROT 6.4*  ALBUMIN 3.7   No results for input(s): "LIPASE", "AMYLASE" in the last 168 hours. No results for input(s): "AMMONIA" in the last 168 hours. Coagulation Profile: No results for input(s): "INR", "PROTIME" in the last 168 hours. Cardiac Enzymes: No results for input(s): "CKTOTAL", "CKMB", "CKMBINDEX", "TROPONINI" in the last 168  hours. BNP (last 3 results) No results for input(s): "PROBNP" in the last 8760 hours. HbA1C: No results for input(s): "HGBA1C" in the last 72 hours. CBG: No results for input(s): "GLUCAP" in the last 168 hours. Lipid Profile: No results for input(s): "CHOL", "HDL", "LDLCALC", "TRIG", "CHOLHDL", "LDLDIRECT" in the last 72 hours. Thyroid Function Tests: No results for input(s): "TSH", "T4TOTAL", "FREET4", "T3FREE", "THYROIDAB" in the last 72 hours. Anemia Panel: No results for input(s): "VITAMINB12", "FOLATE", "FERRITIN", "TIBC", "IRON", "RETICCTPCT" in the last 72 hours. Urine analysis:    Component Value Date/Time   COLORURINE YELLOW 03/21/2023 2151   APPEARANCEUR CLEAR 03/21/2023 2151   LABSPEC 1.006 03/21/2023 2151   PHURINE 8.0 03/21/2023 2151   GLUCOSEU NEGATIVE 03/21/2023 2151   HGBUR NEGATIVE 03/21/2023 2151   BILIRUBINUR NEGATIVE 03/21/2023 2151   KETONESUR NEGATIVE 03/21/2023 2151   PROTEINUR NEGATIVE 03/21/2023 2151   UROBILINOGEN 1.0 08/29/2007 1525   NITRITE NEGATIVE 03/21/2023 2151   LEUKOCYTESUR NEGATIVE 03/21/2023 2151    Radiological Exams on Admission: MR BRAIN WO CONTRAST  Result Date: 03/21/2023 CLINICAL DATA:  Initial evaluation for syncope/presyncope, stroke suspected. EXAM: MRI HEAD WITHOUT CONTRAST TECHNIQUE: Multiplanar, multiecho pulse sequences of the brain and surrounding structures were obtained without intravenous contrast. COMPARISON:  Prior CT from earlier the same day. FINDINGS: Brain: Diffuse prominence of the CSF containing spaces compatible generalized age-related cerebral atrophy. Patchy T2/FLAIR hyperintensity involving the periventricular and deep white matter, consistent with chronic small vessel ischemic disease, mild for age. No evidence for acute or subacute ischemia. Gray-white matter differentiation maintained. No areas of chronic cortical infarction. No acute intracranial hemorrhage. Few punctate chronic micro hemorrhages noted within the  right cerebral hemisphere, of doubtful significance. No mass lesion, midline shift or mass effect. No hydrocephalus or extra-axial fluid collection. Pituitary gland suprasellar region within normal limits. Vascular:  Major intracranial vascular flow voids are maintained. Skull and upper cervical spine: Junction with normal degenerative thickening with pannus formation noted about the dens and tectorial membrane. No high-grade spinal stenosis about the cervicomedullary junction. Bone marrow signal intensity within normal limits. No scalp soft tissue abnormality. Sinuses/Orbits: Prior bilateral ocular lens replacement. Paranasal sinuses are clear. No significant mastoid effusion. Other: None. IMPRESSION: 1. No acute intracranial abnormality. 2. Age-related cerebral atrophy with mild chronic small vessel ischemic disease. Electronically Signed   By: Rise Mu M.D.   On: 03/21/2023 20:53   DG Chest Portable 1 View  Result Date: 03/21/2023 CLINICAL DATA:  Dizziness EXAM: PORTABLE CHEST 1 VIEW COMPARISON:  None Available. FINDINGS: Lungs are well expanded, symmetric, and clear. No pneumothorax or pleural effusion. Cardiac size within normal limits. Pulmonary vascularity is normal. Osseous structures are age-appropriate. No acute bone abnormality. IMPRESSION: No active disease. Electronically Signed   By: Helyn Numbers M.D.   On: 03/21/2023 19:41   CT Head Wo Contrast  Result Date: 03/21/2023 CLINICAL DATA:  Headaches EXAM: CT HEAD WITHOUT CONTRAST TECHNIQUE: Contiguous axial images were obtained from the base of the skull through the vertex without intravenous contrast. RADIATION DOSE REDUCTION: This exam was performed according to the departmental dose-optimization program which includes automated exposure control, adjustment of the mA and/or kV according to patient size and/or use of iterative reconstruction technique. COMPARISON:  03/31/2021 FINDINGS: Brain: No evidence of acute infarction,  hemorrhage, extra-axial collection, ventriculomegaly, or mass effect. Generalized cerebral atrophy. Periventricular white matter low attenuation likely secondary to microangiopathy. Vascular: Cerebrovascular atherosclerotic calcifications are noted. Skull: Negative for fracture or focal lesion. Mineralized pannus formation along the posterior aspect of the dens. Sinuses/Orbits: Visualized portions of the orbits are unremarkable. Visualized portions of the paranasal sinuses are unremarkable. Visualized portions of the mastoid air cells are unremarkable. Other: None. IMPRESSION: 1. No acute intracranial pathology. 2. Chronic microvascular disease and cerebral atrophy. Electronically Signed   By: Elige Ko M.D.   On: 03/21/2023 18:47    Assessment and Plan  Dizziness Abnormal EKG CT head and brain MRI negative for acute finding.  EKG showing sinus rhythm with deep T wave inversions in the anterior leads/diffuse T wave inversions which appear new compared to previous EKG from 2 years ago. Troponin mildly elevated but stable (34> 38) and not consistent with ACS.  Patient denies chest pain.  Continue cardiac monitoring and echocardiogram ordered.  Consult cardiology in the morning based on echo findings.  Also will hold her home antihypertensive medication at this time and check orthostatics, fall precautions.  Mild normocytic anemia No signs of bleeding.  Continue to monitor H&H.  QT prolongation QTc 503.  Potassium normal, keep Mag >2.  Avoid QT prolonging drugs.  Hypertension Hold antihypertensives at this time and check orthostatics.  Dementia Continue Aricept.  Delirium precautions.  Type 2 diabetes Check A1c, sensitive sliding scale insulin ACHS.  Carb modified diet.  DVT prophylaxis: Lovenox Code Status: Full Code (discussed with the patient) Family Communication: Daughter at bedside. Level of care: Telemetry bed Admission status: It is my clinical opinion that referral for OBSERVATION  is reasonable and necessary in this patient based on the above information provided. The aforementioned taken together are felt to place the patient at high risk for further clinical deterioration. However, it is anticipated that the patient may be medically stable for discharge from the hospital within 24 to 48 hours.  John Giovanni MD [facility]  If 7PM-7AM, please contact night-coverage [email or web address]  03/21/2023, 10:30 PM

## 2023-03-21 NOTE — ED Notes (Signed)
Pt is a&ox4, warm and dry to touch. Pt denies any pain but states that she is dizzy and sometimes feels nauseous due to the dizziness. Pt changed into a gown, attached to monitor/vitals. Side rails up x 2, call light within reach, covered with blanket.

## 2023-03-22 ENCOUNTER — Observation Stay (HOSPITAL_COMMUNITY): Payer: 59

## 2023-03-22 ENCOUNTER — Encounter (HOSPITAL_COMMUNITY): Payer: Self-pay | Admitting: Internal Medicine

## 2023-03-22 DIAGNOSIS — R9431 Abnormal electrocardiogram [ECG] [EKG]: Secondary | ICD-10-CM

## 2023-03-22 DIAGNOSIS — I739 Peripheral vascular disease, unspecified: Secondary | ICD-10-CM | POA: Diagnosis not present

## 2023-03-22 DIAGNOSIS — R911 Solitary pulmonary nodule: Secondary | ICD-10-CM | POA: Diagnosis not present

## 2023-03-22 DIAGNOSIS — R42 Dizziness and giddiness: Secondary | ICD-10-CM | POA: Diagnosis not present

## 2023-03-22 DIAGNOSIS — N281 Cyst of kidney, acquired: Secondary | ICD-10-CM | POA: Diagnosis not present

## 2023-03-22 LAB — CBC
HCT: 35.6 % — ABNORMAL LOW (ref 36.0–46.0)
Hemoglobin: 11.6 g/dL — ABNORMAL LOW (ref 12.0–15.0)
MCH: 27.2 pg (ref 26.0–34.0)
MCHC: 32.6 g/dL (ref 30.0–36.0)
MCV: 83.4 fL (ref 80.0–100.0)
Platelets: 156 10*3/uL (ref 150–400)
RBC: 4.27 MIL/uL (ref 3.87–5.11)
RDW: 17.9 % — ABNORMAL HIGH (ref 11.5–15.5)
WBC: 4.4 10*3/uL (ref 4.0–10.5)
nRBC: 0 % (ref 0.0–0.2)

## 2023-03-22 LAB — ECHOCARDIOGRAM COMPLETE
AR max vel: 2.91 cm2
AV Area VTI: 3.45 cm2
AV Area mean vel: 3.12 cm2
AV Mean grad: 4 mmHg
AV Peak grad: 9.1 mmHg
Ao pk vel: 1.51 m/s
Area-P 1/2: 1.8 cm2
Height: 67 in
MV VTI: 2.59 cm2
S' Lateral: 2.6 cm
Weight: 2176 [oz_av]

## 2023-03-22 LAB — BASIC METABOLIC PANEL
Anion gap: 11 (ref 5–15)
BUN: 10 mg/dL (ref 8–23)
CO2: 23 mmol/L (ref 22–32)
Calcium: 9 mg/dL (ref 8.9–10.3)
Chloride: 108 mmol/L (ref 98–111)
Creatinine, Ser: 1.25 mg/dL — ABNORMAL HIGH (ref 0.44–1.00)
GFR, Estimated: 41 mL/min — ABNORMAL LOW (ref >60)
Glucose, Bld: 83 mg/dL (ref 70–99)
Potassium: 3.8 mmol/L (ref 3.5–5.1)
Sodium: 142 mmol/L (ref 135–145)

## 2023-03-22 LAB — CBG MONITORING, ED
Glucose-Capillary: 92 mg/dL (ref 70–99)
Glucose-Capillary: 111 mg/dL — ABNORMAL HIGH (ref 70–99)

## 2023-03-22 LAB — HEMOGLOBIN A1C
Hgb A1c MFr Bld: 6.9 % — ABNORMAL HIGH (ref 4.8–5.6)
Mean Plasma Glucose: 151.33 mg/dL

## 2023-03-22 LAB — GLUCOSE, CAPILLARY: Glucose-Capillary: 99 mg/dL (ref 70–99)

## 2023-03-22 MED ORDER — SODIUM CHLORIDE 0.9 % IV BOLUS
1000.0000 mL | INTRAVENOUS | Status: AC
  Administered 2023-03-22: 1000 mL via INTRAVENOUS

## 2023-03-22 MED ORDER — IOHEXOL 350 MG/ML SOLN
65.0000 mL | Freq: Once | INTRAVENOUS | Status: AC | PRN
  Administered 2023-03-22: 65 mL via INTRAVENOUS

## 2023-03-22 MED ORDER — SODIUM CHLORIDE 0.9 % IV SOLN: INTRAVENOUS | Status: AC

## 2023-03-22 MED ORDER — MELATONIN 3 MG PO TABS
3.0000 mg | ORAL_TABLET | Freq: Every day | ORAL | Status: DC
  Administered 2023-03-23 (×2): 3 mg via ORAL
  Filled 2023-03-22 (×2): qty 1

## 2023-03-22 NOTE — Progress Notes (Signed)
PROGRESS NOTE [name]  WUJ:[id number] DOB: 10-30-1930 DOA: 03/21/2023 PCP: Irena Reichmann, DO  Brief Narrative/Hospital Course: 89 y.o. female with medical history significant of hypertension, hyperlipidemia, PVD, GERD, dementia, type 2 diabetes presenting with a chief complaint of dizziness.  Patient reports chronic dizziness whenever she gets up to walk.  About a week ago, she was just laying in her bed which faces a mirror and as she looked at the mirror, all of a sudden she became very dizzy and her "head was swimming."  She had a headache and felt nauseous at that time but did not vomit.  This episode lasted about 5 minutes and she has not had any further dizziness this past week.  She has continued to have intermittent headaches.  Denies any episodes of chest pain.  Denies shortness of breath.  She reports having a cough 2 weeks ago which she thinks is due to her sitting in front of a fan which blew cold air on her face.  Cough has improved.  No fevers.  She reports ongoing diarrhea for several weeks.  Denies vomiting or abdominal pain.  Denies recent antibiotic use.  No other complaints. ED Course: Vital signs stable on arrival.  Labs showing no leukocytosis, hemoglobin 11.9 (was 14.6 on labs done 2 years ago), MCV 80.4, platelet count 187k, sodium 141, potassium 4.2, chloride 106, bicarb 23, BUN 12, creatinine 1.2, glucose 173, magnesium 1.9, SARS-CoV-2 PCR negative.  EKG showing sinus rhythm, QTc 503.  Deep T wave inversions in anterior leads/diffuse T wave inversions new compared to previous EKG from 2 years ago. Troponin mildly elevated but stable (34> 38).  Chest x-ray showing no active disease.  CT head and brain MRI negative for acute finding. Patient is admitted echo was ordered Overnight patient hypotensive up to 82/44 and 1 L bolus ordered and BP stabilized Patient was dilated hydration orthostatic vitals were negative.  She did well with PT OT and ambulated.      Subjective: Earlier this morning patient was orthostatic dizzy blood pressure soft given bolus normal saline Later able to ambulate with PT OT, orthostatic vitals negative   Assessment and Plan: Principal Problem:   Dizziness Active Problems:   Essential hypertension   QT prolongation   Normocytic anemia   Dementia (HCC)   Type 2 diabetes mellitus (HCC)   Dizziness Abnormal EKG QT prolongation CT head and brain MRI negative for acute finding.  EKG showing sinus rhythm with deep T wave inversions in the anterior leads/diffuse T wave inversions which appear new compared to previous EKG from 2 years ago. Troponin mildly elevated but stable (34> 38) and not consistent with ACS.  Patient denies chest pain.  Repeat EKG ordered, echo obtained PT OT CONSULTED.  Echo shows EF 70 to 75%, no RWMA G1 DD RV systolic function normal and RV size normal mitral valve is degenerative aortic valve is tricuspid.  EKG showing TWI-T wave inversion widespread CARD CONSULTED.  Mild normocytic anemia No signs of bleeding.  Continue to monitor H&H.   Recent Labs  Lab 03/21/23 1656 03/22/23 0550  HGB 11.9* 11.6*  HCT 36.6 35.6*    Hypertension Continue to hold antihypertensives due to soft BP early in the morning BP trending up   Dementia Stable, she is pleasant, continue Aricept, cont delirium precautions.   Type 2 diabetes: With hyperglycemia in 229.  Sugar is stable today,  hba1c 6.9, cont ssi Recent Labs  Lab 03/21/23 2350 03/22/23 0550 03/22/23 0740 03/22/23 1223  GLUCAP  229*  --  92 111*  HGBA1C  --  6.9*  --   --     DVT prophylaxis: enoxaparin (LOVENOX) injection 30 mg Start: 03/22/23 1000 Code Status:   Code Status: Full Code Family Communication: plan of care discussed with patient/mo family at bedside. Patient status is:  admitted as observation but remains hospitalized for ongoing  because of dizziness Level of care: Telemetry Cardiac   Dispo: The patient is from: home             Anticipated disposition: home Objective: Vitals last 24 hrs: Vitals:   03/22/23 1025 03/22/23 1105 03/22/23 1234 03/22/23 1415  BP:  94/74 (!) 150/68   Pulse:  (!) 57 62   Resp:   16   Temp: 98.7 F (37.1 C)   98.6 F (37 C)  TempSrc: Oral   Oral  SpO2:  100% 100%   Weight:      Height:       Weight change:   Physical Examination: General exam: alert awake, older than stated age HEENT:Oral mucosa moist, Ear/Nose WNL grossly Respiratory system: bilaterally clear BS, no use of accessory muscle Cardiovascular system: S1 & S2 +, No JVD. Gastrointestinal system: Abdomen soft,NT,ND, BS+ Nervous System:Alert, awake, moving extremities. Extremities: LE edema neg,distal peripheral pulses palpable.  Skin: No rashes,no icterus. MSK: Normal muscle bulk,tone, power  Medications reviewed:  Scheduled Meds:  donepezil  5 mg Oral QHS   enoxaparin (LOVENOX) injection  30 mg Subcutaneous Q24H   insulin aspart  0-5 Units Subcutaneous QHS   insulin aspart  0-9 Units Subcutaneous TID WC   Continuous Infusions:  sodium chloride 75 mL/hr at 03/22/23 7253      Diet Order             Diet Carb Modified Fluid consistency: Thin; Room service appropriate? Yes  Diet effective now                   Intake/Output Summary (Last 24 hours) at 03/22/2023 1440 Last data filed at 03/22/2023 0647 Gross per 24 hour  Intake 1.2 ml  Output --  Net 1.2 ml   Net IO Since Admission: 1.2 mL [03/22/23 1440]  Wt Readings from Last 3 Encounters:  03/21/23 61.7 kg  12/20/20 64 kg  07/16/20 64.9 kg     Unresulted Labs (From admission, onward)     Start     Ordered   03/22/23 0131  C Difficile Quick Screen w PCR reflex  (C Difficile quick screen w PCR reflex panel )  Once, for 24 hours,   TIMED       References:    CDiff Information Tool   03/22/23 0130   03/22/23 0131  Gastrointestinal Panel by PCR , Stool  (Gastrointestinal Panel by PCR, Stool                                                                                                                                                      **  Does Not include CLOSTRIDIUM DIFFICILE testing. **If CDIFF testing is needed, place order from the "C Difficile Testing" order set.**)  Once,   R        03/22/23 0130          Data Reviewed: I have personally reviewed following labs and imaging studies CBC: Recent Labs  Lab 03/21/23 1656 03/22/23 0550  WBC 5.3 4.4  NEUTROABS 2.3  --   HGB 11.9* 11.6*  HCT 36.6 35.6*  MCV 80.4 83.4  PLT 187 156   Basic Metabolic Panel: Recent Labs  Lab 03/21/23 1656 03/21/23 1745 03/22/23 0550  NA 141  --  142  K 4.2  --  3.8  CL 106  --  108  CO2 23  --  23  GLUCOSE 173*  --  83  BUN 12  --  10  CREATININE 1.28*  --  1.25*  CALCIUM 9.7  --  9.0  MG  --  1.9  --    GFR: Estimated Creatinine Clearance: 29.1 mL/min (A) (by C-G formula based on SCr of 1.25 mg/dL (H)). Liver Function Tests: Recent Labs  Lab 03/21/23 1656  AST 22  ALT 19  ALKPHOS 59  BILITOT 1.2  PROT 6.4*  ALBUMIN 3.7  HbA1C: Recent Labs    03/22/23 0550  HGBA1C 6.9*   CBG: Recent Labs  Lab 03/21/23 2350 03/22/23 0740 03/22/23 1223  GLUCAP 229* 92 111*   Recent Results (from the past 240 hour(s))  SARS Coronavirus 2 by RT PCR (hospital order, performed in [facility] lab) *cepheid single result test* Anterior Nasal Swab     Status: None   Collection Time: 03/21/23  6:59 PM   Specimen: Anterior Nasal Swab  Result Value Ref Range Status   SARS Coronavirus 2 by RT PCR NEGATIVE NEGATIVE Final    Comment: Performed at [facility], [address], [place], [place] [address]  Antimicrobials: Anti-infectives (From admission, onward)    None      Culture/Microbiology No results found for: "SDES", "SPECREQUEST", "CULT", "REPTSTATUS"   Radiology Studies: ECHOCARDIOGRAM COMPLETE  Result Date: 03/22/2023    ECHOCARDIOGRAM REPORT   Patient Name:   GERREN DEROOS Date of Exam: 03/22/2023  Medical Rec #:  [id number]       Height:       67.0 in Accession #:    [id number]      Weight:       136.0 lb Date of Birth:  Jan 31, 1931       BSA:          1.717 m Patient Age:    89 years        BP:           113/54 mmHg Patient Gender: F               HR:           68 bpm. Exam Location:  Inpatient Procedure: 2D Echo, Color Doppler and Cardiac Doppler Indications:    R94.31 Abnormal EKG  History:        Patient has no prior history of Echocardiogram examinations.                 Risk Factors:Hypertension, Diabetes and Dyslipidemia.  Sonographer:    Irving Burton Senior RDCS Referring Phys: [id number] VASUNDHRA RATHORE IMPRESSIONS  1. Left ventricular ejection fraction, by estimation, is 70 to 75%. Left ventricular ejection fraction by PLAX is 73 %. The left ventricle has hyperdynamic function.  The left ventricle has no regional wall motion abnormalities. Left ventricular diastolic parameters are consistent with Grade I diastolic dysfunction (impaired relaxation). Elevated left atrial pressure.  2. Right ventricular systolic function is normal. The right ventricular size is normal. Tricuspid regurgitation signal is inadequate for assessing PA pressure.  3. The mitral valve is degenerative. Trivial mitral valve regurgitation. No evidence of mitral stenosis. The mean mitral valve gradient is 3.5 mmHg.  4. The aortic valve is tricuspid. Aortic valve regurgitation is trivial. Aortic valve sclerosis is present, with no evidence of aortic valve stenosis.  5. The inferior vena cava is normal in size with <50% respiratory variability, suggesting right atrial pressure of 8 mmHg. Comparison(s): No prior Echocardiogram. FINDINGS  Left Ventricle: Left ventricular ejection fraction, by estimation, is 70 to 75%. Left ventricular ejection fraction by PLAX is 73 %. The left ventricle has hyperdynamic function. The left ventricle has no regional wall motion abnormalities. The left ventricular internal cavity size was normal in size. There is  no left ventricular hypertrophy. Left ventricular diastolic parameters are consistent with Grade I diastolic dysfunction (impaired relaxation). Elevated left atrial pressure. Right Ventricle: The right ventricular size is normal. Right ventricular systolic function is normal. Tricuspid regurgitation signal is inadequate for assessing PA pressure. Left Atrium: Left atrial size was normal in size. Right Atrium: Right atrial size was normal in size. Pericardium: There is no evidence of pericardial effusion. Mitral Valve: The mitral valve is degenerative in appearance. There is moderate calcification of the mitral valve leaflet(s). Mild mitral annular calcification. Trivial mitral valve regurgitation. No evidence of mitral valve stenosis. MV peak gradient, 7.2 mmHg. The mean mitral valve gradient is 3.5 mmHg. Tricuspid Valve: The tricuspid valve is normal in structure. Tricuspid valve regurgitation is trivial. No evidence of tricuspid stenosis. Aortic Valve: The aortic valve is tricuspid. Aortic valve regurgitation is trivial. Aortic valve sclerosis is present, with no evidence of aortic valve stenosis. Aortic valve mean gradient measures 4.0 mmHg. Aortic valve peak gradient measures 9.1 mmHg. Aortic valve area, by VTI measures 3.45 cm. Pulmonic Valve: The pulmonic valve was normal in structure. Pulmonic valve regurgitation is trivial. No evidence of pulmonic stenosis. Aorta: The aortic root and ascending aorta are structurally normal, with no evidence of dilitation. Venous: The inferior vena cava is normal in size with less than 50% respiratory variability, suggesting right atrial pressure of 8 mmHg. IAS/Shunts: No atrial level shunt detected by color flow Doppler.  LEFT VENTRICLE PLAX 2D LV EF:         Left            Diastology                ventricular     LV e' medial:    6.20 cm/s                ejection        LV E/e' medial:  15.3                fraction by     LV e' lateral:   7.62 cm/s                PLAX is  73      LV E/e' lateral: 12.4                %. LVIDd:         4.50 cm LVIDs:         2.60 cm LV PW:  0.90 cm LV IVS:        0.90 cm LVOT diam:     2.10 cm LV SV:         95 LV SV Index:   55 LVOT Area:     3.46 cm  RIGHT VENTRICLE RV S prime:     14.00 cm/s TAPSE (M-mode): 2.4 cm LEFT ATRIUM             Index        RIGHT ATRIUM           Index LA diam:        4.50 cm 2.62 cm/m   RA Area:     13.70 cm LA Vol (A2C):   56.6 ml 32.97 ml/m  RA Volume:   26.40 ml  15.38 ml/m LA Vol (A4C):   60.6 ml 35.30 ml/m LA Biplane Vol: 58.9 ml 34.31 ml/m  AORTIC VALVE AV Area (Vmax):    2.91 cm AV Area (Vmean):   3.12 cm AV Area (VTI):     3.45 cm AV Vmax:           151.00 cm/s AV Vmean:          90.200 cm/s AV VTI:            0.275 m AV Peak Grad:      9.1 mmHg AV Mean Grad:      4.0 mmHg LVOT Vmax:         127.00 cm/s LVOT Vmean:        81.200 cm/s LVOT VTI:          0.274 m LVOT/AV VTI ratio: 1.00  AORTA Ao Root diam: 2.90 cm Ao Asc diam:  3.20 cm MITRAL VALVE MV Area (PHT): 1.80 cm     SHUNTS MV Area VTI:   2.59 cm     Systemic VTI:  0.27 m MV Peak grad:  7.2 mmHg     Systemic Diam: 2.10 cm MV Mean grad:  3.5 mmHg MV Vmax:       1.34 m/s MV Vmean:      90.5 cm/s MV Decel Time: 422 msec MV E velocity: 94.70 cm/s MV A velocity: 138.00 cm/s MV E/A ratio:  0.69 Olga Millers MD Electronically signed by Olga Millers MD Signature Date/Time: 03/22/2023/9:59:11 AM    Final    MR BRAIN WO CONTRAST  Result Date: 03/21/2023 CLINICAL DATA:  Initial evaluation for syncope/presyncope, stroke suspected. EXAM: MRI HEAD WITHOUT CONTRAST TECHNIQUE: Multiplanar, multiecho pulse sequences of the brain and surrounding structures were obtained without intravenous contrast. COMPARISON:  Prior CT from earlier the same day. FINDINGS: Brain: Diffuse prominence of the CSF containing spaces compatible generalized age-related cerebral atrophy. Patchy T2/FLAIR hyperintensity involving the periventricular and deep white matter,  consistent with chronic small vessel ischemic disease, mild for age. No evidence for acute or subacute ischemia. Gray-white matter differentiation maintained. No areas of chronic cortical infarction. No acute intracranial hemorrhage. Few punctate chronic micro hemorrhages noted within the right cerebral hemisphere, of doubtful significance. No mass lesion, midline shift or mass effect. No hydrocephalus or extra-axial fluid collection. Pituitary gland suprasellar region within normal limits. Vascular: Major intracranial vascular flow voids are maintained. Skull and upper cervical spine: Junction with normal degenerative thickening with pannus formation noted about the dens and tectorial membrane. No high-grade spinal stenosis about the cervicomedullary junction. Bone marrow signal intensity within normal limits. No scalp soft tissue abnormality. Sinuses/Orbits: Prior bilateral ocular lens replacement. Paranasal sinuses are clear.  No significant mastoid effusion. Other: None. IMPRESSION: 1. No acute intracranial abnormality. 2. Age-related cerebral atrophy with mild chronic small vessel ischemic disease. Electronically Signed   By: Rise Mu M.D.   On: 03/21/2023 20:53   DG Chest Portable 1 View  Result Date: 03/21/2023 CLINICAL DATA:  Dizziness EXAM: PORTABLE CHEST 1 VIEW COMPARISON:  None Available. FINDINGS: Lungs are well expanded, symmetric, and clear. No pneumothorax or pleural effusion. Cardiac size within normal limits. Pulmonary vascularity is normal. Osseous structures are age-appropriate. No acute bone abnormality. IMPRESSION: No active disease. Electronically Signed   By: Helyn Numbers M.D.   On: 03/21/2023 19:41   CT Head Wo Contrast  Result Date: 03/21/2023 CLINICAL DATA:  Headaches EXAM: CT HEAD WITHOUT CONTRAST TECHNIQUE: Contiguous axial images were obtained from the base of the skull through the vertex without intravenous contrast. RADIATION DOSE REDUCTION: This exam was performed  according to the departmental dose-optimization program which includes automated exposure control, adjustment of the mA and/or kV according to patient size and/or use of iterative reconstruction technique. COMPARISON:  03/31/2021 FINDINGS: Brain: No evidence of acute infarction, hemorrhage, extra-axial collection, ventriculomegaly, or mass effect. Generalized cerebral atrophy. Periventricular white matter low attenuation likely secondary to microangiopathy. Vascular: Cerebrovascular atherosclerotic calcifications are noted. Skull: Negative for fracture or focal lesion. Mineralized pannus formation along the posterior aspect of the dens. Sinuses/Orbits: Visualized portions of the orbits are unremarkable. Visualized portions of the paranasal sinuses are unremarkable. Visualized portions of the mastoid air cells are unremarkable. Other: None. IMPRESSION: 1. No acute intracranial pathology. 2. Chronic microvascular disease and cerebral atrophy. Electronically Signed   By: Elige Ko M.D.   On: 03/21/2023 18:47     LOS: 0 days   Lanae Boast, MD [facility]  03/22/2023, 2:40 PM

## 2023-03-22 NOTE — Evaluation (Signed)
Physical Therapy Evaluation Patient Details Name: [name] MRN: [id number] DOB: [birth year] Today's Date: 03/22/2023  History of Present Illness  Pt is a 89 y.o. female admitted 8/18 for dizziness. PMH: hypertension, hyperlipidemia, PVD, GERD, dementia, type 2 diabetes   Clinical Impression  PT eval complete. PTA pt lived at home alone, mod I mobility with QC. On eval, she demo mod I bed mobility and transfers. CGA provided for amb 200' with QC. Steady gait noted. VSS on RA. Pt reports she feels at her baseline. She has no questions or concerns regarding her mobility. No further skilled PT intervention indicated. PT signing off.         If plan is discharge home, recommend the following:     Can travel by private vehicle        Equipment Recommendations None recommended by PT  Recommendations for Other Services       Functional Status Assessment Patient has not had a recent decline in their functional status     Precautions / Restrictions Precautions Precautions: None      Mobility  Bed Mobility Overal bed mobility: Modified Independent             General bed mobility comments: increased time    Transfers Overall transfer level: Modified independent Equipment used: Ambulation equipment used                    Ambulation/Gait Ambulation/Gait assistance: Contact guard assist Gait Distance (Feet): 200 Feet Assistive device: Quad cane Gait Pattern/deviations: Step-through pattern, Decreased stride length Gait velocity: mildly decreased Gait velocity interpretation: 1.31 - 2.62 ft/sec, indicative of limited community ambulator   General Gait Details: Steady gait noted with QC. Assist provided for safety but no physical assist needed.  Stairs            Wheelchair Mobility     Tilt Bed    Modified Rankin (Stroke Patients Only)       Balance Overall balance assessment: Mild deficits observed, not formally tested                                            Pertinent Vitals/Pain Pain Assessment Pain Assessment: No/denies pain    Home Living Family/patient expects to be discharged to:: Private residence Living Arrangements: Alone Available Help at Discharge: Family;Available PRN/intermittently Type of Home: House Home Access: Level entry       Home Layout: One level Home Equipment: Cane - quad;Shower seat      Prior Function Prior Level of Function : Needs assist             Mobility Comments: amb community distances with QC mod I ADLs Comments: takes bird bath unless family is present to provide supervision for shower, family drives pt to grocery store     Extremity/Trunk Assessment   Upper Extremity Assessment Upper Extremity Assessment: Defer to OT evaluation    Lower Extremity Assessment Lower Extremity Assessment: Overall WFL for tasks assessed    Cervical / Trunk Assessment Cervical / Trunk Assessment: Kyphotic  Communication   Communication Communication: No apparent difficulties  Cognition Arousal: Alert Behavior During Therapy: WFL for tasks assessed/performed Overall Cognitive Status: Within Functional Limits for tasks assessed  General Comments General comments (skin integrity, edema, etc.): VSS on RA    Exercises     Assessment/Plan    PT Assessment Patient does not need any further PT services  PT Problem List         PT Treatment Interventions      PT Goals (Current goals can be found in the Care Plan section)  Acute Rehab PT Goals Patient Stated Goal: home PT Goal Formulation: All assessment and education complete, DC therapy    Frequency       Co-evaluation               AM-PAC PT "6 Clicks" Mobility  Outcome Measure Help needed turning from your back to your side while in a flat bed without using bedrails?: None Help needed moving from lying on your back to sitting on the  side of a flat bed without using bedrails?: None Help needed moving to and from a bed to a chair (including a wheelchair)?: None Help needed standing up from a chair using your arms (e.g., wheelchair or bedside chair)?: A Little Help needed to walk in hospital room?: A Little Help needed climbing 3-5 steps with a railing? : A Little 6 Click Score: 21    End of Session   Activity Tolerance: Patient tolerated treatment well Patient left: in bed;with call bell/phone within reach Nurse Communication: Mobility status PT Visit Diagnosis: Difficulty in walking, not elsewhere classified (R26.2)    Time: 0950-1006 PT Time Calculation (min) (ACUTE ONLY): 16 min   Charges:   PT Evaluation $PT Eval Low Complexity: 1 Low   PT General Charges $$ ACUTE PT VISIT: 1 Visit         Ferd Glassing., PT  Office # [phone]   Ilda Foil 03/22/2023, 11:49 AM

## 2023-03-22 NOTE — Plan of Care (Signed)
  Problem: Education: Goal: Knowledge of General Education information will improve Description: Including pain rating scale, medication(s)/side effects and non-pharmacologic comfort measures Outcome: Progressing   Problem: Health Behavior/Discharge Planning: Goal: Ability to manage health-related needs will improve Outcome: Progressing   Problem: Clinical Measurements: Goal: Respiratory complications will improve Outcome: Progressing   Problem: Clinical Measurements: Goal: Cardiovascular complication will be avoided Outcome: Progressing   Problem: Elimination: Goal: Will not experience complications related to bowel motility Outcome: Progressing   Problem: Pain Managment: Goal: General experience of comfort will improve Outcome: Progressing   Problem: Skin Integrity: Goal: Risk for impaired skin integrity will decrease Outcome: Progressing

## 2023-03-22 NOTE — Consult Note (Addendum)
Cardiology Consultation   Patient ID: [name] MRN: [id number]; DOB: 03/07/1931  Admit date: 03/21/2023 Date of Consult: 03/22/2023  PCP:  Sierra Reichmann, DO   [name] Providers Cardiologist:  Sierra Batty, MD   {  Patient Profile:   [name] is a 89 y.o. female with a hx of hypertension, hyperlipidemia, PVD, GERD, dementia, type 2 diabetes who is being seen 03/22/2023 for the evaluation of dizziness at the request of Dr. Jonathon Fuentes.  History of Present Illness:   [name] was seen previously by [name] in May 2022 and has a diagnosis of critical limb ischemia with history of revascularization; however, no specific details of this. At the time he had reported managing her right heel wound.  Her last ABI was in August 2023 that showed severe toe brachial disease.  Right ABI 0.11, left 0.16.  Otherwise no significant cardiac history.   Currently patient is being evaluated for an episode of dizziness that occurred last week when sitting in bed and all of a sudden got very dizzy and felt that the room was spinning and that her head was swimming.  She also had some complaints of pain in her ear and her neck, but but attributed this to tooth pain.  Stated that she put on a scarf wrapped around her neck with some relief..  Patient does report some chronic dizziness whenever walking.  This episode lasted about 5 to 10 minutes and has not had any reoccurrences.  She did note some nausea.  She denied any complaints of chest pain, shortness of breath, peripheral edema, syncope, decreased exercise intolerance, orthopnea, palpitations, racing heartbeats.  She did report some lower extremity pain around her heel down both legs.  Patient states that she feels fine but was brought in by her daughter who was concerned.  She lives at home and is functionally independent and has some assistance with her daughter.  She had an MRI and CT of the head that did not show any acute  pathology.  EKG did show new onset of diffuse T wave inversions and had mildly elevated but flat troponins 40-41.  Chest x-ray negative.  Echocardiogram showing hyperdynamic LVEF 70 to 75% with no regional wall motion abnormalities.  Normal RV function. Past Medical History:  Diagnosis Date   Acid reflux    Arthritis    Dementia (HCC)    Diabetes mellitus without complication (HCC)    High cholesterol    Hypertension     Past Surgical History:  Procedure Laterality Date   ABDOMINAL HYSTERECTOMY     APPENDECTOMY     CATARACT EXTRACTION, BILATERAL     CHOLECYSTECTOMY     HEMORRHOID SURGERY     TONSILLECTOMY        Inpatient Medications: Scheduled Meds:  donepezil  5 mg Oral QHS   enoxaparin (LOVENOX) injection  30 mg Subcutaneous Q24H   insulin aspart  0-5 Units Subcutaneous QHS   insulin aspart  0-9 Units Subcutaneous TID WC   Continuous Infusions:  sodium chloride 75 mL/hr at 03/22/23 0649   PRN Meds: acetaminophen **OR** acetaminophen  Allergies:   No Known Allergies  Social History:   Social History   Socioeconomic History   Marital status: Widowed    Spouse name: Not on file   Number of children: Not on file   Years of education: Not on file   Highest education level: Not on file  Occupational History   Not on file  Tobacco Use  Smoking status: Never   Smokeless tobacco: Never  Substance and Sexual Activity   Alcohol use: No   Drug use: No   Sexual activity: Not on file  Other Topics Concern   Not on file  Social History Narrative   Not on file   Social Determinants of Health   Financial Resource Strain: Not on file  Food Insecurity: Not on file  Transportation Needs: Not on file  Physical Activity: Not on file  Stress: Not on file  Social Connections: Not on file  Intimate Partner Violence: Not on file    Family History:   History reviewed. No pertinent family history.   ROS:  Please see the history of present illness.  All other ROS  reviewed and negative.     Physical Exam/Data:   Vitals:   03/22/23 1025 03/22/23 1105 03/22/23 1234 03/22/23 1415  BP:  94/74 (!) 150/68   Pulse:  (!) 57 62   Resp:   16   Temp: 98.7 F (37.1 C)   98.6 F (37 C)  TempSrc: Oral   Oral  SpO2:  100% 100%   Weight:      Height:        Intake/Output Summary (Last 24 hours) at 03/22/2023 1517 Last data filed at 03/22/2023 0647 Gross per 24 hour  Intake 1.2 ml  Output --  Net 1.2 ml      03/21/2023    4:30 PM 12/20/2020    3:30 PM 07/16/2020    3:01 PM  Last 3 Weights  Weight (lbs) 136 lb 141 lb 143 lb  Weight (kg) 61.689 kg 63.957 kg 64.864 kg     Body mass index is 21.3 kg/m.  General:  Well nourished, well developed, in no acute distress HEENT: normal Neck: no JVD Vascular: No carotid bruits; Distal pulses 2+ bilaterally Cardiac: Irregular, no murmur appreciated Lungs:  clear to auscultation bilaterally, no wheezing, rhonchi or rales  Abd: soft, nontender, no hepatomegaly  Ext: no edema Musculoskeletal:  No deformities, BUE and BLE strength normal and equal Skin: warm and dry  Neuro:  CNs 2-12 intact, no focal abnormalities noted Psych:  Normal affect   EKG:  The EKG was personally reviewed and demonstrates: Sinus arrhythmia, heart rate 69, prolonged QTc 540s, diffuse T wave inversions.  Telemetry:  Telemetry was personally reviewed and demonstrates: Sinus arrhythmia 60-70s.  6-second SVT.  Some ST depressions Relevant CV Studies: Echo  03/22/2023  1. Left ventricular ejection fraction, by estimation, is 70 to 75%. Left  ventricular ejection fraction by PLAX is 73 %. The left ventricle has  hyperdynamic function. The left ventricle has no regional wall motion  abnormalities. Left ventricular  diastolic parameters are consistent with Grade I diastolic dysfunction  (impaired relaxation). Elevated left atrial pressure.   2. Right ventricular systolic function is normal. The right ventricular  size is normal.  Tricuspid regurgitation signal is inadequate for assessing  PA pressure.   3. The mitral valve is degenerative. Trivial mitral valve regurgitation.  No evidence of mitral stenosis. The mean mitral valve gradient is 3.5  mmHg.   4. The aortic valve is tricuspid. Aortic valve regurgitation is trivial.  Aortic valve sclerosis is present, with no evidence of aortic valve  stenosis.   5. The inferior vena cava is normal in size with <50% respiratory  variability, suggesting right atrial pressure of 8 mmHg.   Comparison(s): No prior Echocardiogram.   Laboratory Data:  High Sensitivity Troponin:   Recent Labs  Lab 03/21/23 1656 03/21/23 1859  TROPONINIHS 34* 38*     Chemistry Recent Labs  Lab 03/21/23 1656 03/21/23 1745 03/22/23 0550  NA 141  --  142  K 4.2  --  3.8  CL 106  --  108  CO2 23  --  23  GLUCOSE 173*  --  83  BUN 12  --  10  CREATININE 1.28*  --  1.25*  CALCIUM 9.7  --  9.0  MG  --  1.9  --   GFRNONAA 40*  --  41*  ANIONGAP 12  --  11    Recent Labs  Lab 03/21/23 1656  PROT 6.4*  ALBUMIN 3.7  AST 22  ALT 19  ALKPHOS 59  BILITOT 1.2   Lipids No results for input(s): "CHOL", "TRIG", "HDL", "LABVLDL", "LDLCALC", "CHOLHDL" in the last 168 hours.  Hematology Recent Labs  Lab 03/21/23 1656 03/22/23 0550  WBC 5.3 4.4  RBC 4.55 4.27  HGB 11.9* 11.6*  HCT 36.6 35.6*  MCV 80.4 83.4  MCH 26.2 27.2  MCHC 32.5 32.6  RDW 17.5* 17.9*  PLT 187 156   Thyroid No results for input(s): "TSH", "FREET4" in the last 168 hours.  BNPNo results for input(s): "BNP", "PROBNP" in the last 168 hours.  DDimer No results for input(s): "DDIMER" in the last 168 hours.   Radiology/Studies:  ECHOCARDIOGRAM COMPLETE  Result Date: 03/22/2023    ECHOCARDIOGRAM REPORT   Patient Name:   QUADASIA DISBRO Date of Exam: 03/22/2023 Medical Rec #:  [id number]       Height:       67.0 in Accession #:    [id number]      Weight:       136.0 lb Date of Birth:  May 08, 1931       BSA:           1.717 m Patient Age:    89 years        BP:           113/54 mmHg Patient Gender: F               HR:           68 bpm. Exam Location:  Inpatient Procedure: 2D Echo, Color Doppler and Cardiac Doppler Indications:    R94.31 Abnormal EKG  History:        Patient has no prior history of Echocardiogram examinations.                 Risk Factors:Hypertension, Diabetes and Dyslipidemia.  Sonographer:    Irving Burton Senior RDCS Referring Phys: [id number] VASUNDHRA RATHORE IMPRESSIONS  1. Left ventricular ejection fraction, by estimation, is 70 to 75%. Left ventricular ejection fraction by PLAX is 73 %. The left ventricle has hyperdynamic function. The left ventricle has no regional wall motion abnormalities. Left ventricular diastolic parameters are consistent with Grade I diastolic dysfunction (impaired relaxation). Elevated left atrial pressure.  2. Right ventricular systolic function is normal. The right ventricular size is normal. Tricuspid regurgitation signal is inadequate for assessing PA pressure.  3. The mitral valve is degenerative. Trivial mitral valve regurgitation. No evidence of mitral stenosis. The mean mitral valve gradient is 3.5 mmHg.  4. The aortic valve is tricuspid. Aortic valve regurgitation is trivial. Aortic valve sclerosis is present, with no evidence of aortic valve stenosis.  5. The inferior vena cava is normal in size with <50% respiratory variability, suggesting right atrial pressure of 8 mmHg. Comparison(s): No prior Echocardiogram.  FINDINGS  Left Ventricle: Left ventricular ejection fraction, by estimation, is 70 to 75%. Left ventricular ejection fraction by PLAX is 73 %. The left ventricle has hyperdynamic function. The left ventricle has no regional wall motion abnormalities. The left ventricular internal cavity size was normal in size. There is no left ventricular hypertrophy. Left ventricular diastolic parameters are consistent with Grade I diastolic dysfunction (impaired relaxation). Elevated  left atrial pressure. Right Ventricle: The right ventricular size is normal. Right ventricular systolic function is normal. Tricuspid regurgitation signal is inadequate for assessing PA pressure. Left Atrium: Left atrial size was normal in size. Right Atrium: Right atrial size was normal in size. Pericardium: There is no evidence of pericardial effusion. Mitral Valve: The mitral valve is degenerative in appearance. There is moderate calcification of the mitral valve leaflet(s). Mild mitral annular calcification. Trivial mitral valve regurgitation. No evidence of mitral valve stenosis. MV peak gradient, 7.2 mmHg. The mean mitral valve gradient is 3.5 mmHg. Tricuspid Valve: The tricuspid valve is normal in structure. Tricuspid valve regurgitation is trivial. No evidence of tricuspid stenosis. Aortic Valve: The aortic valve is tricuspid. Aortic valve regurgitation is trivial. Aortic valve sclerosis is present, with no evidence of aortic valve stenosis. Aortic valve mean gradient measures 4.0 mmHg. Aortic valve peak gradient measures 9.1 mmHg. Aortic valve area, by VTI measures 3.45 cm. Pulmonic Valve: The pulmonic valve was normal in structure. Pulmonic valve regurgitation is trivial. No evidence of pulmonic stenosis. Aorta: The aortic root and ascending aorta are structurally normal, with no evidence of dilitation. Venous: The inferior vena cava is normal in size with less than 50% respiratory variability, suggesting right atrial pressure of 8 mmHg. IAS/Shunts: No atrial level shunt detected by color flow Doppler.  LEFT VENTRICLE PLAX 2D LV EF:         Left            Diastology                ventricular     LV e' medial:    6.20 cm/s                ejection        LV E/e' medial:  15.3                fraction by     LV e' lateral:   7.62 cm/s                PLAX is 73      LV E/e' lateral: 12.4                %. LVIDd:         4.50 cm LVIDs:         2.60 cm LV PW:         0.90 cm LV IVS:        0.90 cm LVOT diam:      2.10 cm LV SV:         95 LV SV Index:   55 LVOT Area:     3.46 cm  RIGHT VENTRICLE RV S prime:     14.00 cm/s TAPSE (M-mode): 2.4 cm LEFT ATRIUM             Index        RIGHT ATRIUM           Index LA diam:        4.50 cm 2.62 cm/m   RA Area:  13.70 cm LA Vol (A2C):   56.6 ml 32.97 ml/m  RA Volume:   26.40 ml  15.38 ml/m LA Vol (A4C):   60.6 ml 35.30 ml/m LA Biplane Vol: 58.9 ml 34.31 ml/m  AORTIC VALVE AV Area (Vmax):    2.91 cm AV Area (Vmean):   3.12 cm AV Area (VTI):     3.45 cm AV Vmax:           151.00 cm/s AV Vmean:          90.200 cm/s AV VTI:            0.275 m AV Peak Grad:      9.1 mmHg AV Mean Grad:      4.0 mmHg LVOT Vmax:         127.00 cm/s LVOT Vmean:        81.200 cm/s LVOT VTI:          0.274 m LVOT/AV VTI ratio: 1.00  AORTA Ao Root diam: 2.90 cm Ao Asc diam:  3.20 cm MITRAL VALVE MV Area (PHT): 1.80 cm     SHUNTS MV Area VTI:   2.59 cm     Systemic VTI:  0.27 m MV Peak grad:  7.2 mmHg     Systemic Diam: 2.10 cm MV Mean grad:  3.5 mmHg MV Vmax:       1.34 m/s MV Vmean:      90.5 cm/s MV Decel Time: 422 msec MV E velocity: 94.70 cm/s MV A velocity: 138.00 cm/s MV E/A ratio:  0.69 Olga Millers MD Electronically signed by Olga Millers MD Signature Date/Time: 03/22/2023/9:59:11 AM    Final    MR BRAIN WO CONTRAST  Result Date: 03/21/2023 CLINICAL DATA:  Initial evaluation for syncope/presyncope, stroke suspected. EXAM: MRI HEAD WITHOUT CONTRAST TECHNIQUE: Multiplanar, multiecho pulse sequences of the brain and surrounding structures were obtained without intravenous contrast. COMPARISON:  Prior CT from earlier the same day. FINDINGS: Brain: Diffuse prominence of the CSF containing spaces compatible generalized age-related cerebral atrophy. Patchy T2/FLAIR hyperintensity involving the periventricular and deep white matter, consistent with chronic small vessel ischemic disease, mild for age. No evidence for acute or subacute ischemia. Gray-white matter differentiation  maintained. No areas of chronic cortical infarction. No acute intracranial hemorrhage. Few punctate chronic micro hemorrhages noted within the right cerebral hemisphere, of doubtful significance. No mass lesion, midline shift or mass effect. No hydrocephalus or extra-axial fluid collection. Pituitary gland suprasellar region within normal limits. Vascular: Major intracranial vascular flow voids are maintained. Skull and upper cervical spine: Junction with normal degenerative thickening with pannus formation noted about the dens and tectorial membrane. No high-grade spinal stenosis about the cervicomedullary junction. Bone marrow signal intensity within normal limits. No scalp soft tissue abnormality. Sinuses/Orbits: Prior bilateral ocular lens replacement. Paranasal sinuses are clear. No significant mastoid effusion. Other: None. IMPRESSION: 1. No acute intracranial abnormality. 2. Age-related cerebral atrophy with mild chronic small vessel ischemic disease. Electronically Signed   By: Rise Mu M.D.   On: 03/21/2023 20:53   DG Chest Portable 1 View  Result Date: 03/21/2023 CLINICAL DATA:  Dizziness EXAM: PORTABLE CHEST 1 VIEW COMPARISON:  None Available. FINDINGS: Lungs are well expanded, symmetric, and clear. No pneumothorax or pleural effusion. Cardiac size within normal limits. Pulmonary vascularity is normal. Osseous structures are age-appropriate. No acute bone abnormality. IMPRESSION: No active disease. Electronically Signed   By: Helyn Numbers M.D.   On: 03/21/2023 19:41   CT Head Wo Contrast  Result Date:  03/21/2023 CLINICAL DATA:  Headaches EXAM: CT HEAD WITHOUT CONTRAST TECHNIQUE: Contiguous axial images were obtained from the base of the skull through the vertex without intravenous contrast. RADIATION DOSE REDUCTION: This exam was performed according to the departmental dose-optimization program which includes automated exposure control, adjustment of the mA and/or kV according to  patient size and/or use of iterative reconstruction technique. COMPARISON:  03/31/2021 FINDINGS: Brain: No evidence of acute infarction, hemorrhage, extra-axial collection, ventriculomegaly, or mass effect. Generalized cerebral atrophy. Periventricular white matter low attenuation likely secondary to microangiopathy. Vascular: Cerebrovascular atherosclerotic calcifications are noted. Skull: Negative for fracture or focal lesion. Mineralized pannus formation along the posterior aspect of the dens. Sinuses/Orbits: Visualized portions of the orbits are unremarkable. Visualized portions of the paranasal sinuses are unremarkable. Visualized portions of the mastoid air cells are unremarkable. Other: None. IMPRESSION: 1. No acute intracranial pathology. 2. Chronic microvascular disease and cerebral atrophy. Electronically Signed   By: Elige Ko M.D.   On: 03/21/2023 18:47     Assessment and Plan:   Dizziness Diffuse T wave inversion, elevated trops (40s), prolonged Qtc 540s Symptoms sound consistent with vertigo however difficult to rule out possible ischemia.  Diffuse T wave inversions on EKG may be suggestive of LAD ischemia.  She had associated symptoms of ear and neck pain that sound atypical however cannot be certain this is not anginal equivalent.  She is also demented and may not be able to articulate symptoms well.  Fortunately echocardiogram shows hyperdynamic LVEF 70 to 75% with no regional wall motion abnormalities or significant valvular disease.  Discussed possible cardiac catheterization and patient still not certain whether she would be agreeable to this.  Will plan for CTA for the time being to further evaluate her coronary calcifications, suspect some degree of disease given her significant PVD and DM. Repeat EKG in the morning Avoid QTc prolonging medications.  Donepezil should be stopped or switched to something else given concerns for QTc prolongation and bradycardia.  Will discuss with  primary team. Continue to correct potassium and magnesium as needed.  Currently normal. Goal: Magnesium greater than 2, potassium greater than 4.  PVD Left and right ABIs from August 2023 are 0.16, 0.11 respectively indicating significant disease.  Will need to follow-up with [name]. Will order lipid panel  Hypertension Continue to hold antihypertensives.  Has had some hypotensive readings.  Dementia Diabetes Per primary team  Risk Assessment/Risk Scores:   For questions or updates, please contact [facility] Please consult [email or web address] for contact info under    Signed, Abagail Kitchens, PA-C  03/22/2023 3:17 PM   I have seen and examined the patient along with Abagail Kitchens, PA-C .  I have reviewed the chart, notes and new data.  I agree with PA/NP's note.  Key new complaints: Her symptoms of dizziness with a spinning sensation and nausea from a week ago sound very much like plain vertigo.  I am more concerned about her symptoms of left ear pain and left jaw pain sometimes descending into her throat and upper chest.  She blames this on her teeth, but I wonder if this is her pattern of angina pectoris.  She is able to help hold the clinic.  Discussion, but I wonder if her memory problems are interfering with getting a proper review of symptoms.  Her feet hurt "all the time". Key examination changes: Normal cardiovascular exam except nonpalpable pulses in the left foot.  The foot is warm and has decent capillary refill.  She has bilateral skin ulcerations on the lateral surface of the midfoot. Key new findings / data: ECG is markedly abnormal, with deep ST segment depression and T wave inversions seen in the anteroseptal and lateral leads, less prominent in the inferior leads, with severe QT interval prolongation.  Roughly a year earlier the ECG did not show any repolarization abnormalities.  Current echo does not show any regional wall motion abnormalities, but does show  LVH.  PLAN: The ECG is highly concerning for proximal LAD artery or global left ventricular ischemia. Similar ECG changes can be seen with acute intercranial events or with Takotsubo syndrome, but the MRI of the brain and the echocardiogram did not support either diagnosis. In view of her age and known presence of PAD, longstanding type 2 diabetes mellitus and hypercholesterolemia, there is a very high likelihood of widespread atherosclerosis including CAD. The only chest imaging study available for review was a CT from 2011.  This does show atherosclerosis in the aorta, but shows relatively minor calcified plaque in the coronary distribution. It is a little challenging to decide the best course of action in this mildly symptomatic and independent 89 year old.  I have strong suspicion that she has proximal LAD artery high-grade stenosis, but also am concerned that this may occur in the setting of multivessel disease.  She is not a candidate for surgical revascularization. Will start off with coronary CT angiography.  If this shows no significant coronary stenoses or if on the contrary shows severe multivessel disease, we will continue with medical management.  If she has isolated LAD artery stenosis, we could consider PCI.  Thurmon Fair, MD, [facility] [phone] 03/22/2023, 4:14 PM

## 2023-03-22 NOTE — ED Notes (Signed)
Pt resting in bed talking to daughter at bedside. Denies any current needs at this time. Call bell within reach. NAD noted. Ongoing care.

## 2023-03-22 NOTE — Progress Notes (Signed)
Mechanical fall secondary from hypotension:  Side nurse reported that patient blood pressure has been improved to 107/46 from 82/44 after giving 1 L of NS bolus.  Currently on NS 75 cc/h for 24 hours for maintenance fluid.  Continue to monitor improvement of blood pressure.  Hold home blood pressure regimen.  -Continue fall precaution. -Consulted PT and OT for evaluation for aseline strength and DME requirement.   Tereasa Coop, MD [facility] 03/22/2023, 5:41 AM

## 2023-03-22 NOTE — Hospital Course (Addendum)
89 y.o. female with medical history significant of hypertension, hyperlipidemia, PVD, GERD, dementia, type 2 diabetes presenting with a chief complaint of dizziness.  Patient reports chronic dizziness whenever she gets up to walk.  About a week ago, she was just laying in her bed which faces a mirror and as she looked at the mirror, all of a sudden she became very dizzy and her "head was swimming."  She had a headache and felt nauseous at that time but did not vomit.  This episode lasted about 5 minutes and she has not had any further dizziness this past week.  She has continued to have intermittent headaches.  Denies any episodes of chest pain.  Denies shortness of breath.  She reports having a cough 2 weeks ago which she thinks is due to her sitting in front of a fan which blew cold air on her face.  Cough has improved.  No fevers.  She reports ongoing diarrhea for several weeks.  Denies vomiting or abdominal pain.  Denies recent antibiotic use.  No other complaints. ED Course: Vital signs stable on arrival.  Labs showing no leukocytosis, hemoglobin 11.9 (was 14.6 on labs done 2 years ago), MCV 80.4, platelet count 187k, sodium 141, potassium 4.2, chloride 106, bicarb 23, BUN 12, creatinine 1.2, glucose 173, magnesium 1.9, SARS-CoV-2 PCR negative.  EKG showing sinus rhythm, QTc 503.  Deep T wave inversions in anterior leads/diffuse T wave inversions new compared to previous EKG from 2 years ago. Troponin mildly elevated but stable (34> 38).  Chest x-ray showing no active disease.  CT head and brain MRI negative for acute finding. Patient is admitted echo was ordered She had brief hypotension  82/44 8/19 am and 1 L bolus ordered and BP stabilized Patient was dilated hydration orthostatic vitals were negative.  She did well with PT OT and ambulated. Her diarrhea is improving.  CT coronary resulted per cardiology although ECG changes are striking, she does not have angina or a meaningful change in cardiac  enzymes and the severe stenoses are limited to smaller coronary branches (PDA and OM). Plan medical therapy in this sedentary 89 year old with comorbid conditions d/c meds: ASA 81 mg daily, atorvastatin 20 mg daily, metoprolol succinate 12.5 mg daily, continue benazepril home dose. Other recommendations (labs, testing, etc):  recheck ECg at follow up visit Follow up as an outpatient:  2-4 weeks

## 2023-03-22 NOTE — Evaluation (Signed)
Occupational Therapy Evaluation Patient Details Name: [name] MRN: [id number] DOB: [birth year] Today's Date: 03/22/2023   History of Present Illness Pt is a 89 y.o. female admitted 8/18 for dizziness. PMH: hypertension, hyperlipidemia, PVD, GERD, dementia, type 2 diabetes   Clinical Impression   Pt presents at baseline Ind/Mod I  with ADLs and mobility. PTA pt lived at home alone and was Ind /Mod I with dressing, grooming, self feeding, toileting and sink bathing; pt;s daughter provides assist/Sup with showers and pt uses a cane for mobility. All education completed and no further acute OT services are indicated at this time, OT will sign off      If plan is discharge home, recommend the following: Assistance with cooking/housework;A little help with bathing/dressing/bathroom;Assist for transportation    Functional Status Assessment  Patient has not had a recent decline in their functional status  Equipment Recommendations  None recommended by OT    Recommendations for Other Services       Precautions / Restrictions Precautions Precautions: None Restrictions Weight Bearing Restrictions: No      Mobility Bed Mobility Overal bed mobility: Modified Independent                  Transfers Overall transfer level: Modified independent                        Balance Overall balance assessment: Mild deficits observed, not formally tested                                         ADL either performed or assessed with clinical judgement   ADL Overall ADL's : Independent;At baseline                                       General ADL Comments: Sup with showers at baseline     Vision Ability to See in Adequate Light: 0 Adequate Patient Visual Report: No change from baseline       Perception         Praxis         Pertinent Vitals/Pain Pain Assessment Pain Assessment: No/denies pain     Extremity/Trunk  Assessment Upper Extremity Assessment Upper Extremity Assessment: Overall WFL for tasks assessed   Lower Extremity Assessment Lower Extremity Assessment: Defer to PT evaluation   Cervical / Trunk Assessment Cervical / Trunk Assessment: Kyphotic   Communication Communication Communication: No apparent difficulties   Cognition Arousal: Alert Behavior During Therapy: WFL for tasks assessed/performed Overall Cognitive Status: Within Functional Limits for tasks assessed                                       General Comments  VSS on RA    Exercises     Shoulder Instructions      Home Living Family/patient expects to be discharged to:: Private residence Living Arrangements: Alone Available Help at Discharge: Family;Available PRN/intermittently Type of Home: House Home Access: Level entry     Home Layout: One level     Bathroom Shower/Tub: Chief Strategy Officer: Standard     Home Equipment: Cane - quad;Shower seat          Prior  Functioning/Environment Prior Level of Function : Needs assist             Mobility Comments: amb community distances with QC mod I ADLs Comments: pt dresses, grooms, feeds and toilets herself; takes bird bath unless family is present to provide supervision for shower, family drives pt to grocery store        OT Problem List: Decreased activity tolerance      OT Treatment/Interventions:      OT Goals(Current goals can be found in the care plan section) Acute Rehab OT Goals Patient Stated Goal: go home  OT Frequency:      Co-evaluation              AM-PAC OT "6 Clicks" Daily Activity     Outcome Measure Help from another person eating meals?: None Help from another person taking care of personal grooming?: None Help from another person toileting, which includes using toliet, bedpan, or urinal?: None Help from another person bathing (including washing, rinsing, drying)?: A Little Help from  another person to put on and taking off regular upper body clothing?: None Help from another person to put on and taking off regular lower body clothing?: None 6 Click Score: 23   End of Session Equipment Utilized During Treatment: Gait belt  Activity Tolerance: Patient tolerated treatment well Patient left: in bed;with call bell/phone within reach;with family/visitor present (sitting EOB eating lunch)  OT Visit Diagnosis: Other abnormalities of gait and mobility (R26.89)                Time: 4034-7425 OT Time Calculation (min): 18 min Charges:  OT General Charges $OT Visit: 1 Visit OT Evaluation $OT Eval Low Complexity: 1 Low   Galen Manila 03/22/2023, 2:41 PM

## 2023-03-22 NOTE — Progress Notes (Signed)
Echocardiogram 2D Echocardiogram has been performed.  Warren Lacy Brehanna Deveny RDCS 03/22/2023, 9:47 AM

## 2023-03-22 NOTE — ED Notes (Signed)
ED TO INPATIENT HANDOFF REPORT  ED Nurse Name and Phone #: Jensyn Cambria, RN [phone]  S Name/Age/Gender [name] 89 y.o. female Room/Bed: 043C/043C  Code Status   Code Status: Full Code  Home/SNF/Other Home Patient oriented to: self, place, time, and situation Is this baseline? Yes   Triage Complete: Triage complete  Chief Complaint Dizziness [R42]  Triage Note No notes on file   Allergies No Known Allergies  Level of Care/Admitting Diagnosis ED Disposition     ED Disposition  Admit   Condition  --   Comment  Hospital Area: [facility] [[id number]]  Level of Care: Telemetry Cardiac [[id number]]  May place patient in observation at [facility] or [facility] if equivalent level of care is available:: Yes  Covid Evaluation: Asymptomatic - no recent exposure (last 10 days) testing not required  Diagnosis: Dizziness [952841]  Admitting Physician: John Giovanni [[id number]]  Attending Physician: John Giovanni [[id number]]          B Medical/Surgery History Past Medical History:  Diagnosis Date   Acid reflux    Arthritis    Dementia (HCC)    Diabetes mellitus without complication (HCC)    High cholesterol    Hypertension    Past Surgical History:  Procedure Laterality Date   ABDOMINAL HYSTERECTOMY     APPENDECTOMY     CATARACT EXTRACTION, BILATERAL     CHOLECYSTECTOMY     HEMORRHOID SURGERY     TONSILLECTOMY       A IV Location/Drains/Wounds Patient Lines/Drains/Airways Status     Active Line/Drains/Airways     Name Placement date Placement time Site Days   Peripheral IV 03/21/23 20 G Anterior;Right Forearm 03/21/23  1645  Forearm  1            Intake/Output Last 24 hours  Intake/Output Summary (Last 24 hours) at 03/22/2023 1544 Last data filed at 03/22/2023 0647 Gross per 24 hour  Intake 1.2 ml  Output --  Net 1.2 ml    Labs/Imaging Results for orders placed or performed during the hospital encounter of 03/21/23 (from the  past 48 hour(s))  CBC with Differential     Status: Abnormal   Collection Time: 03/21/23  4:56 PM  Result Value Ref Range   WBC 5.3 4.0 - 10.5 K/uL   RBC 4.55 3.87 - 5.11 MIL/uL   Hemoglobin 11.9 (L) 12.0 - 15.0 g/dL   HCT 44.0 34.7 - 42.5 %   MCV 80.4 80.0 - 100.0 fL   MCH 26.2 26.0 - 34.0 pg   MCHC 32.5 30.0 - 36.0 g/dL   RDW 95.6 (H) 38.7 - 56.4 %   Platelets 187 150 - 400 K/uL   nRBC 0.0 0.0 - 0.2 %   Neutrophils Relative % 43 %   Neutro Abs 2.3 1.7 - 7.7 K/uL   Lymphocytes Relative 44 %   Lymphs Abs 2.3 0.7 - 4.0 K/uL   Monocytes Relative 11 %   Monocytes Absolute 0.6 0.1 - 1.0 K/uL   Eosinophils Relative 1 %   Eosinophils Absolute 0.1 0.0 - 0.5 K/uL   Basophils Relative 1 %   Basophils Absolute 0.0 0.0 - 0.1 K/uL   Immature Granulocytes 0 %   Abs Immature Granulocytes 0.01 0.00 - 0.07 K/uL    Comment: Performed at [facility], [address] [place], [place] [address]  Comprehensive metabolic panel     Status: Abnormal   Collection Time: 03/21/23  4:56 PM  Result Value Ref Range  Sodium 141 135 - 145 mmol/L   Potassium 4.2 3.5 - 5.1 mmol/L   Chloride 106 98 - 111 mmol/L   CO2 23 22 - 32 mmol/L   Glucose, Bld 173 (H) 70 - 99 mg/dL    Comment: Glucose reference range applies only to samples taken after fasting for at least 8 hours.   BUN 12 8 - 23 mg/dL   Creatinine, Ser 0.86 (H) 0.44 - 1.00 mg/dL   Calcium 9.7 8.9 - 57.8 mg/dL   Total Protein 6.4 (L) 6.5 - 8.1 g/dL   Albumin 3.7 3.5 - 5.0 g/dL   AST 22 15 - 41 U/L   ALT 19 0 - 44 U/L   Alkaline Phosphatase 59 38 - 126 U/L   Total Bilirubin 1.2 0.3 - 1.2 mg/dL   GFR, Estimated 40 (L) >60 mL/min    Comment: (NOTE) Calculated using the CKD-EPI Creatinine Equation (2021)    Anion gap 12 5 - 15    Comment: Performed at [facility], [address] [place], [place] [address]  Troponin I (High Sensitivity)     Status: Abnormal   Collection Time: 03/21/23  4:56 PM  Result Value Ref Range   Troponin I  (High Sensitivity) 34 (H) <18 ng/L    Comment: (NOTE) Elevated high sensitivity troponin I (hsTnI) values and significant  changes across serial measurements may suggest ACS but many other  chronic and acute conditions are known to elevate hsTnI results.  Refer to the "Links" section for chest pain algorithms and additional  guidance. Performed at [facility], [address], [place], [place] [address]   Magnesium     Status: None   Collection Time: 03/21/23  5:45 PM  Result Value Ref Range   Magnesium 1.9 1.7 - 2.4 mg/dL    Comment: Performed at [facility], [address] [place], [place] [address]  Troponin I (High Sensitivity)     Status: Abnormal   Collection Time: 03/21/23  6:59 PM  Result Value Ref Range   Troponin I (High Sensitivity) 38 (H) <18 ng/L    Comment: (NOTE) Elevated high sensitivity troponin I (hsTnI) values and significant  changes across serial measurements may suggest ACS but many other  chronic and acute conditions are known to elevate hsTnI results.  Refer to the "Links" section for chest pain algorithms and additional  guidance. Performed at [facility], [address] [place], [place] [address]   SARS Coronavirus 2 by RT PCR (hospital order, performed in [facility] hospital lab) *cepheid single result test* Anterior Nasal Swab     Status: None   Collection Time: 03/21/23  6:59 PM   Specimen: Anterior Nasal Swab  Result Value Ref Range   SARS Coronavirus 2 by RT PCR NEGATIVE NEGATIVE    Comment: Performed at [facility], [address] [place], [place] [address]  Urinalysis, Routine w reflex microscopic -Urine, Clean Catch     Status: None   Collection Time: 03/21/23  9:51 PM  Result Value Ref Range   Color, Urine YELLOW YELLOW   APPearance CLEAR CLEAR   Specific Gravity, Urine 1.006 1.005 - 1.030   pH 8.0 5.0 - 8.0   Glucose, UA NEGATIVE NEGATIVE mg/dL   Hgb urine dipstick NEGATIVE NEGATIVE   Bilirubin Urine  NEGATIVE NEGATIVE   Ketones, ur NEGATIVE NEGATIVE mg/dL   Protein, ur NEGATIVE NEGATIVE mg/dL   Nitrite NEGATIVE NEGATIVE   Leukocytes,Ua NEGATIVE NEGATIVE  Comment: Performed at [facility], [address], [place], [place] [address]  CBG monitoring, ED     Status: Abnormal   Collection Time: 03/21/23 11:50 PM  Result Value Ref Range   Glucose-Capillary 229 (H) 70 - 99 mg/dL    Comment: Glucose reference range applies only to samples taken after fasting for at least 8 hours.  CBC     Status: Abnormal   Collection Time: 03/22/23  5:50 AM  Result Value Ref Range   WBC 4.4 4.0 - 10.5 K/uL   RBC 4.27 3.87 - 5.11 MIL/uL   Hemoglobin 11.6 (L) 12.0 - 15.0 g/dL   HCT 19.1 (L) 47.8 - 29.5 %   MCV 83.4 80.0 - 100.0 fL   MCH 27.2 26.0 - 34.0 pg   MCHC 32.6 30.0 - 36.0 g/dL   RDW 62.1 (H) 30.8 - 65.7 %   Platelets 156 150 - 400 K/uL   nRBC 0.0 0.0 - 0.2 %    Comment: Performed at [facility], [address] [place], [place] [address]  Basic metabolic panel     Status: Abnormal   Collection Time: 03/22/23  5:50 AM  Result Value Ref Range   Sodium 142 135 - 145 mmol/L   Potassium 3.8 3.5 - 5.1 mmol/L   Chloride 108 98 - 111 mmol/L   CO2 23 22 - 32 mmol/L   Glucose, Bld 83 70 - 99 mg/dL    Comment: Glucose reference range applies only to samples taken after fasting for at least 8 hours.   BUN 10 8 - 23 mg/dL   Creatinine, Ser 2.95 (H) 0.44 - 1.00 mg/dL   Calcium 9.0 8.9 - 28.4 mg/dL   GFR, Estimated 41 (L) >60 mL/min    Comment: (NOTE) Calculated using the CKD-EPI Creatinine Equation (2021)    Anion gap 11 5 - 15    Comment: Performed at [facility], [address], [place], [place] [address]  Hemoglobin A1c     Status: Abnormal   Collection Time: 03/22/23  5:50 AM  Result Value Ref Range   Hgb A1c MFr Bld 6.9 (H) 4.8 - 5.6 %    Comment: (NOTE) Pre diabetes:          5.7%-6.4%  Diabetes:              >6.4%  Glycemic control for   <7.0% adults with  diabetes    Mean Plasma Glucose 151.33 mg/dL    Comment: Performed at [facility], [address] [place], [place] [address]  CBG monitoring, ED     Status: None   Collection Time: 03/22/23  7:40 AM  Result Value Ref Range   Glucose-Capillary 92 70 - 99 mg/dL    Comment: Glucose reference range applies only to samples taken after fasting for at least 8 hours.  CBG monitoring, ED     Status: Abnormal   Collection Time: 03/22/23 12:23 PM  Result Value Ref Range   Glucose-Capillary 111 (H) 70 - 99 mg/dL    Comment: Glucose reference range applies only to samples taken after fasting for at least 8 hours.   ECHOCARDIOGRAM COMPLETE  Result Date: 03/22/2023    ECHOCARDIOGRAM REPORT   Patient Name:   [name] Date of Exam: 03/22/2023 Medical Rec #:  [id number]       Height:       67.0 in Accession #:    [id number]      Weight:  136.0 lb Date of Birth:  July 19, 1931       BSA:          1.717 m Patient Age:    89 years        BP:           113/54 mmHg Patient Gender: F               HR:           68 bpm. Exam Location:  Inpatient Procedure: 2D Echo, Color Doppler and Cardiac Doppler Indications:    R94.31 Abnormal EKG  History:        Patient has no prior history of Echocardiogram examinations.                 Risk Factors:Hypertension, Diabetes and Dyslipidemia.  Sonographer:    Irving Burton Senior RDCS Referring Phys: [id number] VASUNDHRA RATHORE IMPRESSIONS  1. Left ventricular ejection fraction, by estimation, is 70 to 75%. Left ventricular ejection fraction by PLAX is 73 %. The left ventricle has hyperdynamic function. The left ventricle has no regional wall motion abnormalities. Left ventricular diastolic parameters are consistent with Grade I diastolic dysfunction (impaired relaxation). Elevated left atrial pressure.  2. Right ventricular systolic function is normal. The right ventricular size is normal. Tricuspid regurgitation signal is inadequate for assessing PA pressure.  3. The mitral valve  is degenerative. Trivial mitral valve regurgitation. No evidence of mitral stenosis. The mean mitral valve gradient is 3.5 mmHg.  4. The aortic valve is tricuspid. Aortic valve regurgitation is trivial. Aortic valve sclerosis is present, with no evidence of aortic valve stenosis.  5. The inferior vena cava is normal in size with <50% respiratory variability, suggesting right atrial pressure of 8 mmHg. Comparison(s): No prior Echocardiogram. FINDINGS  Left Ventricle: Left ventricular ejection fraction, by estimation, is 70 to 75%. Left ventricular ejection fraction by PLAX is 73 %. The left ventricle has hyperdynamic function. The left ventricle has no regional wall motion abnormalities. The left ventricular internal cavity size was normal in size. There is no left ventricular hypertrophy. Left ventricular diastolic parameters are consistent with Grade I diastolic dysfunction (impaired relaxation). Elevated left atrial pressure. Right Ventricle: The right ventricular size is normal. Right ventricular systolic function is normal. Tricuspid regurgitation signal is inadequate for assessing PA pressure. Left Atrium: Left atrial size was normal in size. Right Atrium: Right atrial size was normal in size. Pericardium: There is no evidence of pericardial effusion. Mitral Valve: The mitral valve is degenerative in appearance. There is moderate calcification of the mitral valve leaflet(s). Mild mitral annular calcification. Trivial mitral valve regurgitation. No evidence of mitral valve stenosis. MV peak gradient, 7.2 mmHg. The mean mitral valve gradient is 3.5 mmHg. Tricuspid Valve: The tricuspid valve is normal in structure. Tricuspid valve regurgitation is trivial. No evidence of tricuspid stenosis. Aortic Valve: The aortic valve is tricuspid. Aortic valve regurgitation is trivial. Aortic valve sclerosis is present, with no evidence of aortic valve stenosis. Aortic valve mean gradient measures 4.0 mmHg. Aortic valve peak  gradient measures 9.1 mmHg. Aortic valve area, by VTI measures 3.45 cm. Pulmonic Valve: The pulmonic valve was normal in structure. Pulmonic valve regurgitation is trivial. No evidence of pulmonic stenosis. Aorta: The aortic root and ascending aorta are structurally normal, with no evidence of dilitation. Venous: The inferior vena cava is normal in size with less than 50% respiratory variability, suggesting right atrial pressure of 8 mmHg. IAS/Shunts: No atrial level shunt detected by color flow Doppler.  LEFT VENTRICLE PLAX 2D LV EF:         Left            Diastology                ventricular     LV e' medial:    6.20 cm/s                ejection        LV E/e' medial:  15.3                fraction by     LV e' lateral:   7.62 cm/s                PLAX is 73      LV E/e' lateral: 12.4                %. LVIDd:         4.50 cm LVIDs:         2.60 cm LV PW:         0.90 cm LV IVS:        0.90 cm LVOT diam:     2.10 cm LV SV:         95 LV SV Index:   55 LVOT Area:     3.46 cm  RIGHT VENTRICLE RV S prime:     14.00 cm/s TAPSE (M-mode): 2.4 cm LEFT ATRIUM             Index        RIGHT ATRIUM           Index LA diam:        4.50 cm 2.62 cm/m   RA Area:     13.70 cm LA Vol (A2C):   56.6 ml 32.97 ml/m  RA Volume:   26.40 ml  15.38 ml/m LA Vol (A4C):   60.6 ml 35.30 ml/m LA Biplane Vol: 58.9 ml 34.31 ml/m  AORTIC VALVE AV Area (Vmax):    2.91 cm AV Area (Vmean):   3.12 cm AV Area (VTI):     3.45 cm AV Vmax:           151.00 cm/s AV Vmean:          90.200 cm/s AV VTI:            0.275 m AV Peak Grad:      9.1 mmHg AV Mean Grad:      4.0 mmHg LVOT Vmax:         127.00 cm/s LVOT Vmean:        81.200 cm/s LVOT VTI:          0.274 m LVOT/AV VTI ratio: 1.00  AORTA Ao Root diam: 2.90 cm Ao Asc diam:  3.20 cm MITRAL VALVE MV Area (PHT): 1.80 cm     SHUNTS MV Area VTI:   2.59 cm     Systemic VTI:  0.27 m MV Peak grad:  7.2 mmHg     Systemic Diam: 2.10 cm MV Mean grad:  3.5 mmHg MV Vmax:       1.34 m/s MV Vmean:       90.5 cm/s MV Decel Time: 422 msec MV E velocity: 94.70 cm/s MV A velocity: 138.00 cm/s MV E/A ratio:  0.69 Sierra Millers MD Electronically signed by Sierra Millers MD Signature Date/Time: 03/22/2023/9:59:11 AM    Final    MR BRAIN WO CONTRAST  Result Date: 03/21/2023 CLINICAL  DATA:  Initial evaluation for syncope/presyncope, stroke suspected. EXAM: MRI HEAD WITHOUT CONTRAST TECHNIQUE: Multiplanar, multiecho pulse sequences of the brain and surrounding structures were obtained without intravenous contrast. COMPARISON:  Prior CT from earlier the same day. FINDINGS: Brain: Diffuse prominence of the CSF containing spaces compatible generalized age-related cerebral atrophy. Patchy T2/FLAIR hyperintensity involving the periventricular and deep white matter, consistent with chronic small vessel ischemic disease, mild for age. No evidence for acute or subacute ischemia. Gray-white matter differentiation maintained. No areas of chronic cortical infarction. No acute intracranial hemorrhage. Few punctate chronic micro hemorrhages noted within the right cerebral hemisphere, of doubtful significance. No mass lesion, midline shift or mass effect. No hydrocephalus or extra-axial fluid collection. Pituitary gland suprasellar region within normal limits. Vascular: Major intracranial vascular flow voids are maintained. Skull and upper cervical spine: Junction with normal degenerative thickening with pannus formation noted about the dens and tectorial membrane. No high-grade spinal stenosis about the cervicomedullary junction. Bone marrow signal intensity within normal limits. No scalp soft tissue abnormality. Sinuses/Orbits: Prior bilateral ocular lens replacement. Paranasal sinuses are clear. No significant mastoid effusion. Other: None. IMPRESSION: 1. No acute intracranial abnormality. 2. Age-related cerebral atrophy with mild chronic small vessel ischemic disease. Electronically Signed   By: Rise Mu M.D.   On:  03/21/2023 20:53   DG Chest Portable 1 View  Result Date: 03/21/2023 CLINICAL DATA:  Dizziness EXAM: PORTABLE CHEST 1 VIEW COMPARISON:  None Available. FINDINGS: Lungs are well expanded, symmetric, and clear. No pneumothorax or pleural effusion. Cardiac size within normal limits. Pulmonary vascularity is normal. Osseous structures are age-appropriate. No acute bone abnormality. IMPRESSION: No active disease. Electronically Signed   By: Helyn Numbers M.D.   On: 03/21/2023 19:41   CT Head Wo Contrast  Result Date: 03/21/2023 CLINICAL DATA:  Headaches EXAM: CT HEAD WITHOUT CONTRAST TECHNIQUE: Contiguous axial images were obtained from the base of the skull through the vertex without intravenous contrast. RADIATION DOSE REDUCTION: This exam was performed according to the departmental dose-optimization program which includes automated exposure control, adjustment of the mA and/or kV according to patient size and/or use of iterative reconstruction technique. COMPARISON:  03/31/2021 FINDINGS: Brain: No evidence of acute infarction, hemorrhage, extra-axial collection, ventriculomegaly, or mass effect. Generalized cerebral atrophy. Periventricular white matter low attenuation likely secondary to microangiopathy. Vascular: Cerebrovascular atherosclerotic calcifications are noted. Skull: Negative for fracture or focal lesion. Mineralized pannus formation along the posterior aspect of the dens. Sinuses/Orbits: Visualized portions of the orbits are unremarkable. Visualized portions of the paranasal sinuses are unremarkable. Visualized portions of the mastoid air cells are unremarkable. Other: None. IMPRESSION: 1. No acute intracranial pathology. 2. Chronic microvascular disease and cerebral atrophy. Electronically Signed   By: Elige Ko M.D.   On: 03/21/2023 18:47    Pending Labs Unresulted Labs (From admission, onward)     Start     Ordered   03/22/23 0131  C Difficile Quick Screen w PCR reflex  (C Difficile  quick screen w PCR reflex panel )  Once, for 24 hours,   TIMED       References:    CDiff Information Tool   03/22/23 0130   03/22/23 0131  Gastrointestinal Panel by PCR , Stool  (Gastrointestinal Panel by PCR, Stool                                                                                                                                                     **  Does Not include CLOSTRIDIUM DIFFICILE testing. **If CDIFF testing is needed, place order from the "C Difficile Testing" order set.**)  Once,   R        03/22/23 0130            Vitals/Pain Today's Vitals   03/22/23 1025 03/22/23 1105 03/22/23 1234 03/22/23 1415  BP:  94/74 (!) 150/68   Pulse:  (!) 57 62   Resp:   16   Temp: 98.7 F (37.1 C)   98.6 F (37 C)  TempSrc: Oral   Oral  SpO2:  100% 100%   Weight:      Height:      PainSc:        Isolation Precautions Enteric precautions (UV disinfection)  Medications Medications  donepezil (ARICEPT) tablet 5 mg (5 mg Oral Given 03/21/23 2355)  enoxaparin (LOVENOX) injection 30 mg (30 mg Subcutaneous Given 03/22/23 1042)  acetaminophen (TYLENOL) tablet 650 mg (has no administration in time range)    Or  acetaminophen (TYLENOL) suppository 650 mg (has no administration in time range)  insulin aspart (novoLOG) injection 0-9 Units ( Subcutaneous Not Given 03/22/23 1234)  insulin aspart (novoLOG) injection 0-5 Units (2 Units Subcutaneous Given 03/22/23 0003)  0.9 %  sodium chloride infusion ( Intravenous New Bag/Given 03/22/23 0649)  magnesium sulfate IVPB 1 g 100 mL (0 g Intravenous Stopped 03/22/23 0149)  sodium chloride 0.9 % bolus 1,000 mL (0 mLs Intravenous Stopped 03/22/23 0647)    Mobility walks with device     Focused Assessments    R Recommendations: See Admitting Provider Note  Report given to:   Additional Notes: Patient is A&Ox4, walks with a cane to the bathroom, swallows pills fine, no complaints at this time.

## 2023-03-22 NOTE — Progress Notes (Signed)
Bedside nurse reported patient blood pressure is low 82/44. Patient been admitted earlier for the management of dizziness and hypotension in the setting of oral hypertensive medication. - Giving NS bolus 1 L and continue maintenance fluid with NS 75 cc/h for 1 day.  Tereasa Coop, MD [facility] 03/22/2023, 2:50 AM

## 2023-03-23 ENCOUNTER — Observation Stay (HOSPITAL_COMMUNITY): Payer: 59

## 2023-03-23 ENCOUNTER — Observation Stay (HOSPITAL_COMMUNITY)
Admit: 2023-03-23 | Discharge: 2023-03-23 | Disposition: A | Discharge status: Home / Self Care | Payer: 59 | Attending: Cardiology | Admitting: Cardiology

## 2023-03-23 ENCOUNTER — Other Ambulatory Visit: Payer: Self-pay | Admitting: Cardiology

## 2023-03-23 DIAGNOSIS — R931 Abnormal findings on diagnostic imaging of heart and coronary circulation: Secondary | ICD-10-CM | POA: Diagnosis not present

## 2023-03-23 DIAGNOSIS — R9431 Abnormal electrocardiogram [ECG] [EKG]: Secondary | ICD-10-CM | POA: Diagnosis not present

## 2023-03-23 DIAGNOSIS — D631 Anemia in chronic kidney disease: Secondary | ICD-10-CM | POA: Diagnosis not present

## 2023-03-23 DIAGNOSIS — J90 Pleural effusion, not elsewhere classified: Secondary | ICD-10-CM | POA: Diagnosis not present

## 2023-03-23 DIAGNOSIS — W19XXXA Unspecified fall, initial encounter: Secondary | ICD-10-CM | POA: Diagnosis present

## 2023-03-23 DIAGNOSIS — R42 Dizziness and giddiness: Secondary | ICD-10-CM | POA: Diagnosis not present

## 2023-03-23 DIAGNOSIS — N1832 Chronic kidney disease, stage 3b: Secondary | ICD-10-CM | POA: Diagnosis not present

## 2023-03-23 DIAGNOSIS — Z7982 Long term (current) use of aspirin: Secondary | ICD-10-CM | POA: Diagnosis not present

## 2023-03-23 DIAGNOSIS — I70222 Atherosclerosis of native arteries of extremities with rest pain, left leg: Secondary | ICD-10-CM | POA: Diagnosis not present

## 2023-03-23 DIAGNOSIS — M1990 Unspecified osteoarthritis, unspecified site: Secondary | ICD-10-CM | POA: Diagnosis present

## 2023-03-23 DIAGNOSIS — J9811 Atelectasis: Secondary | ICD-10-CM | POA: Diagnosis not present

## 2023-03-23 DIAGNOSIS — I2510 Atherosclerotic heart disease of native coronary artery without angina pectoris: Secondary | ICD-10-CM

## 2023-03-23 DIAGNOSIS — E1165 Type 2 diabetes mellitus with hyperglycemia: Secondary | ICD-10-CM | POA: Diagnosis not present

## 2023-03-23 DIAGNOSIS — Z1152 Encounter for screening for COVID-19: Secondary | ICD-10-CM | POA: Diagnosis not present

## 2023-03-23 DIAGNOSIS — I959 Hypotension, unspecified: Secondary | ICD-10-CM | POA: Diagnosis not present

## 2023-03-23 DIAGNOSIS — R911 Solitary pulmonary nodule: Secondary | ICD-10-CM | POA: Diagnosis present

## 2023-03-23 DIAGNOSIS — R197 Diarrhea, unspecified: Secondary | ICD-10-CM | POA: Diagnosis present

## 2023-03-23 DIAGNOSIS — E1151 Type 2 diabetes mellitus with diabetic peripheral angiopathy without gangrene: Secondary | ICD-10-CM | POA: Diagnosis not present

## 2023-03-23 DIAGNOSIS — K219 Gastro-esophageal reflux disease without esophagitis: Secondary | ICD-10-CM | POA: Diagnosis present

## 2023-03-23 DIAGNOSIS — Z9049 Acquired absence of other specified parts of digestive tract: Secondary | ICD-10-CM | POA: Diagnosis not present

## 2023-03-23 DIAGNOSIS — R7989 Other specified abnormal findings of blood chemistry: Secondary | ICD-10-CM | POA: Diagnosis present

## 2023-03-23 DIAGNOSIS — Z7984 Long term (current) use of oral hypoglycemic drugs: Secondary | ICD-10-CM | POA: Diagnosis not present

## 2023-03-23 DIAGNOSIS — Z794 Long term (current) use of insulin: Secondary | ICD-10-CM | POA: Diagnosis not present

## 2023-03-23 DIAGNOSIS — R110 Nausea: Secondary | ICD-10-CM | POA: Diagnosis present

## 2023-03-23 DIAGNOSIS — Z79899 Other long term (current) drug therapy: Secondary | ICD-10-CM | POA: Diagnosis not present

## 2023-03-23 DIAGNOSIS — E860 Dehydration: Secondary | ICD-10-CM | POA: Diagnosis present

## 2023-03-23 DIAGNOSIS — E7800 Pure hypercholesterolemia, unspecified: Secondary | ICD-10-CM | POA: Diagnosis present

## 2023-03-23 DIAGNOSIS — I129 Hypertensive chronic kidney disease with stage 1 through stage 4 chronic kidney disease, or unspecified chronic kidney disease: Secondary | ICD-10-CM | POA: Diagnosis not present

## 2023-03-23 DIAGNOSIS — E1122 Type 2 diabetes mellitus with diabetic chronic kidney disease: Secondary | ICD-10-CM | POA: Diagnosis not present

## 2023-03-23 DIAGNOSIS — F0390 Unspecified dementia without behavioral disturbance: Secondary | ICD-10-CM | POA: Diagnosis not present

## 2023-03-23 DIAGNOSIS — N179 Acute kidney failure, unspecified: Secondary | ICD-10-CM | POA: Diagnosis not present

## 2023-03-23 LAB — GLUCOSE, CAPILLARY
Glucose-Capillary: 91 mg/dL (ref 70–99)
Glucose-Capillary: 101 mg/dL — ABNORMAL HIGH (ref 70–99)
Glucose-Capillary: 125 mg/dL — ABNORMAL HIGH (ref 70–99)
Glucose-Capillary: 150 mg/dL — ABNORMAL HIGH (ref 70–99)
Glucose-Capillary: 154 mg/dL — ABNORMAL HIGH (ref 70–99)

## 2023-03-23 LAB — BASIC METABOLIC PANEL
Anion gap: 7 (ref 5–15)
BUN: 8 mg/dL (ref 8–23)
CO2: 23 mmol/L (ref 22–32)
Calcium: 8.6 mg/dL — ABNORMAL LOW (ref 8.9–10.3)
Chloride: 109 mmol/L (ref 98–111)
Creatinine, Ser: 1.1 mg/dL — ABNORMAL HIGH (ref 0.44–1.00)
GFR, Estimated: 48 mL/min — ABNORMAL LOW (ref >60)
Glucose, Bld: 92 mg/dL (ref 70–99)
Potassium: 3.8 mmol/L (ref 3.5–5.1)
Sodium: 139 mmol/L (ref 135–145)

## 2023-03-23 LAB — MAGNESIUM: Magnesium: 1.9 mg/dL (ref 1.7–2.4)

## 2023-03-23 MED ORDER — NITROGLYCERIN 0.4 MG SL SUBL
SUBLINGUAL_TABLET | SUBLINGUAL | Status: AC
  Filled 2023-03-23: qty 2

## 2023-03-23 MED ORDER — IOHEXOL 350 MG/ML SOLN
80.0000 mL | Freq: Once | INTRAVENOUS | Status: AC | PRN
  Administered 2023-03-23: 80 mL via INTRAVENOUS

## 2023-03-23 MED ORDER — NITROGLYCERIN 0.4 MG SL SUBL
0.8000 mg | SUBLINGUAL_TABLET | Freq: Once | SUBLINGUAL | Status: AC
  Administered 2023-03-23: 0.8 mg via SUBLINGUAL

## 2023-03-23 MED ORDER — METOPROLOL SUCCINATE ER 25 MG PO TB24
25.0000 mg | ORAL_TABLET | Freq: Once | ORAL | Status: AC
  Administered 2023-03-23: 25 mg via ORAL
  Filled 2023-03-23: qty 1

## 2023-03-23 MED ORDER — METOPROLOL TARTRATE 5 MG/5ML IV SOLN: 10.0000 mg | INTRAVENOUS | Status: DC | PRN

## 2023-03-23 MED ORDER — DILTIAZEM HCL 25 MG/5ML IV SOLN: 10.0000 mg | INTRAVENOUS | Status: DC | PRN

## 2023-03-23 NOTE — TOC CM/SW Note (Signed)
Transition of Care [facility]) - Inpatient Brief Assessment   Patient Details  Name: Damonique Warbington MRN: [id number] Date of Birth: 1931-03-10  Transition of Care [facility]) CM/SW Contact:    Tom-Johnson, Hershal Coria, RN Phone Number: 03/23/2023, 5:51 PM   Clinical Narrative:  Patient presented to the ED with Dizziness, Cardiology following.   From home alone, Niece Magda Paganini visits and assists. Has all necessary DME's at home.  PCP is Irena Reichmann, DO and uses [employer] on [address]. No PT/OT f/u noted.   CM will continue to follow as patient progresses with care towards discharge.    Transition of Care Asessment: Insurance and Status: Insurance coverage has been reviewed Patient has primary care physician: Yes Home environment has been reviewed: Yes Prior level of function:: Modified Independent Prior/Current Home Services: No current home services Social Determinants of Health Reivew: SDOH reviewed no interventions necessary Readmission risk has been reviewed: Yes Transition of care needs: transition of care needs identified, TOC will continue to follow

## 2023-03-23 NOTE — Progress Notes (Signed)
PROGRESS NOTE [name]  ZOX:[id number] DOB: 07-20-1931 DOA: 03/21/2023 PCP: Irena Reichmann, DO  Brief Narrative/Hospital Course: 89 y.o. female with medical history significant of hypertension, hyperlipidemia, PVD, GERD, dementia, type 2 diabetes presenting with a chief complaint of dizziness.  Patient reports chronic dizziness whenever she gets up to walk.  About a week ago, she was just laying in her bed which faces a mirror and as she looked at the mirror, all of a sudden she became very dizzy and her "head was swimming."  She had a headache and felt nauseous at that time but did not vomit.  This episode lasted about 5 minutes and she has not had any further dizziness this past week.  She has continued to have intermittent headaches.  Denies any episodes of chest pain.  Denies shortness of breath.  She reports having a cough 2 weeks ago which she thinks is due to her sitting in front of a fan which blew cold air on her face.  Cough has improved.  No fevers.  She reports ongoing diarrhea for several weeks.  Denies vomiting or abdominal pain.  Denies recent antibiotic use.  No other complaints. ED Course: Vital signs stable on arrival.  Labs showing no leukocytosis, hemoglobin 11.9 (was 14.6 on labs done 2 years ago), MCV 80.4, platelet count 187k, sodium 141, potassium 4.2, chloride 106, bicarb 23, BUN 12, creatinine 1.2, glucose 173, magnesium 1.9, SARS-CoV-2 PCR negative.  EKG showing sinus rhythm, QTc 503.  Deep T wave inversions in anterior leads/diffuse T wave inversions new compared to previous EKG from 2 years ago. Troponin mildly elevated but stable (34> 38).  Chest x-ray showing no active disease.  CT head and brain MRI negative for acute finding. Patient is admitted echo was ordered She had brief hypotension  82/44 8/19 am and 1 L bolus ordered and BP stabilized Patient was dilated hydration orthostatic vitals were negative.  She did well with PT OT and ambulated.    Subjective: Earlier  this morning patient was orthostatic dizzy blood pressure soft given bolus normal saline Later able to ambulate with PT OT, orthostatic vitals negative   Assessment and Plan: Principal Problem:   Dizziness Active Problems:   Essential hypertension   QT prolongation   Normocytic anemia   Dementia (HCC)   Type 2 diabetes mellitus (HCC)   Dizziness Diffuse T wave inversion Elevated troponins QT prolongation: Due to persistent abnormal EKG with T wave inversion, CT coronary CT angio chest obtained no PE.  Due to QTc prolongation advised to discontinue donepezil.  Await further cardiology clearance before discharge  PVD: Will need outpatient follow-up with Dr. Lubertha Basque and right ABIs from August 2023 are 0.16, 0.11 respectively indicating significant disease .  Follow-up lipid panel as outpatient if not completed here  Hypertension: BP stable holding meds.  AKI: Creatinine improved to 1.0.  Dementia: Present since follow-up with PCP.  Her Aricept discontinued due to prolongation  Type 2 diabetes mellitus with hyperglycemia: Blood sugar is controlled.  Follow-up with PCP  Left solid pulmonary nodule 10 mm advised noncontrast CT chest in 3 months or PET or CT scan or tissue sampling.  Mild normocytic anemia No signs of bleeding.  Continue to monitor H&H.   Recent Labs  Lab 03/21/23 1656 03/22/23 0550  HGB 11.9* 11.6*  HCT 36.6 35.6*    DVT prophylaxis: enoxaparin (LOVENOX) injection 30 mg Start: 03/22/23 1000 Code Status:   Code Status: Full Code Family Communication: plan of care discussed with patient/mo  family at bedside. Patient status is:  admitted as observation but remains hospitalized for ongoing  because of dizziness Level of care: Telemetry Cardiac   Dispo: The patient is from: home            Anticipated disposition: home once cleared by cardio.  Will arrange home health given her advanced age Objective: Vitals last 24 hrs: Vitals:   03/22/23 2000  03/23/23 0044 03/23/23 0345 03/23/23 0804  BP:  (!) 145/75 124/60 137/78  Pulse: 64 62 60   Resp:  18 16 18   Temp:  98.2 F (36.8 C) 98.2 F (36.8 C) 97.7 F (36.5 C)  TempSrc:  Oral Oral Oral  SpO2:  100% 99%   Weight:      Height:       Weight change:   Physical Examination: General exam: alert awake, pleasant HEENT:Oral mucosa moist, Ear/Nose WNL grossly Respiratory system: Bilaterally clear BS,no use of accessory muscle Cardiovascular system: S1 & S2 +, No JVD. Gastrointestinal system: Abdomen soft,NT,ND, BS+ Nervous System: Alert, awake, moving all extremities,and following commands. Extremities: LE edema neg,distal peripheral pulses palpable and warm.  Skin: No rashes,no icterus. MSK: Normal muscle bulk,tone, power   Medications reviewed:  Scheduled Meds:  enoxaparin (LOVENOX) injection  30 mg Subcutaneous Q24H   insulin aspart  0-5 Units Subcutaneous QHS   insulin aspart  0-9 Units Subcutaneous TID WC   melatonin  3 mg Oral QHS   Continuous Infusions:      Diet Order             Diet heart healthy/carb modified Room service appropriate? Yes; Fluid consistency: Thin  Diet effective now                   Intake/Output Summary (Last 24 hours) at 03/23/2023 1558 Last data filed at 03/23/2023 0300 Gross per 24 hour  Intake 1303.64 ml  Output 500 ml  Net 803.64 ml   Net IO Since Admission: 804.84 mL [03/23/23 1558]  Wt Readings from Last 3 Encounters:  03/21/23 61.7 kg  12/20/20 64 kg  07/16/20 64.9 kg     Unresulted Labs (From admission, onward)     Start     Ordered   03/24/23 0500  Lipid panel  Tomorrow morning,   R       Question:  Specimen collection method  Answer:  Lab=Lab collect   03/23/23 1513   03/23/23 0500  Basic metabolic panel  Daily,   R      03/22/23 1604   03/22/23 0131  C Difficile Quick Screen w PCR reflex  (C Difficile quick screen w PCR reflex panel )  Once, for 24 hours,   TIMED       References:    CDiff Information Tool    03/22/23 0130   03/22/23 0131  Gastrointestinal Panel by PCR , Stool  (Gastrointestinal Panel by PCR, Stool                                                                                                                                                     **  Does Not include CLOSTRIDIUM DIFFICILE testing. **If CDIFF testing is needed, place order from the "C Difficile Testing" order set.**)  Once,   R        03/22/23 0130          Data Reviewed: I have personally reviewed following labs and imaging studies CBC: Recent Labs  Lab 03/21/23 1656 03/22/23 0550  WBC 5.3 4.4  NEUTROABS 2.3  --   HGB 11.9* 11.6*  HCT 36.6 35.6*  MCV 80.4 83.4  PLT 187 156   Basic Metabolic Panel: Recent Labs  Lab 03/21/23 1656 03/21/23 1745 03/22/23 0550 03/23/23 0025  NA 141  --  142 139  K 4.2  --  3.8 3.8  CL 106  --  108 109  CO2 23  --  23 23  GLUCOSE 173*  --  83 92  BUN 12  --  10 8  CREATININE 1.28*  --  1.25* 1.10*  CALCIUM 9.7  --  9.0 8.6*  MG  --  1.9  --  1.9   GFR: Estimated Creatinine Clearance: 33.1 mL/min (A) (by C-G formula based on SCr of 1.1 mg/dL (H)). Liver Function Tests: Recent Labs  Lab 03/21/23 1656  AST 22  ALT 19  ALKPHOS 59  BILITOT 1.2  PROT 6.4*  ALBUMIN 3.7  HbA1C: Recent Labs    03/22/23 0550  HGBA1C 6.9*   CBG: Recent Labs  Lab 03/22/23 0740 03/22/23 1223 03/22/23 2130 03/23/23 0807 03/23/23 1238  GLUCAP 92 111* 99 91 101*   Recent Results (from the past 240 hour(s))  SARS Coronavirus 2 by RT PCR (hospital order, performed in [facility] hospital lab) *cepheid single result test* Anterior Nasal Swab     Status: None   Collection Time: 03/21/23  6:59 PM   Specimen: Anterior Nasal Swab  Result Value Ref Range Status   SARS Coronavirus 2 by RT PCR NEGATIVE NEGATIVE Final    Comment: Performed at [facility], [address], [place], [place] [address]  Antimicrobials: Anti-infectives (From admission, onward)    None       Culture/Microbiology No results found for: "SDES", "SPECREQUEST", "CULT", "REPTSTATUS"   Radiology Studies: CT Angio Chest Pulmonary Embolism (PE) W or WO Contrast  Result Date: 03/22/2023 CLINICAL DATA:  Abnormal EKG.  Dizziness. EXAM: CT ANGIOGRAPHY CHEST WITH CONTRAST TECHNIQUE: Multidetector CT imaging of the chest was performed using the standard protocol during bolus administration of intravenous contrast. Multiplanar CT image reconstructions and MIPs were obtained to evaluate the vascular anatomy. RADIATION DOSE REDUCTION: This exam was performed according to the departmental dose-optimization program which includes automated exposure control, adjustment of the mA and/or kV according to patient size and/or use of iterative reconstruction technique. CONTRAST:  65mL OMNIPAQUE IOHEXOL 350 MG/ML SOLN COMPARISON:  CT chest abdomen and pelvis 09/01/2009 FINDINGS: Cardiovascular: Satisfactory opacification of the pulmonary arteries to the segmental level. No evidence of pulmonary embolism. Normal heart size. No pericardial effusion. There are atherosclerotic calcifications of the aorta. Mediastinum/Nodes: No enlarged mediastinal, hilar, or axillary lymph nodes. Thyroid gland, trachea, and esophagus demonstrate no significant findings. Lungs/Pleura: There is a rounded 1 cm nodule in the lingula. Lungs are otherwise clear. No pleural effusion or pneumothorax. Upper Abdomen: No acute abnormality. Cholecystectomy clips are present. There is a 3 cm left renal cyst. Musculoskeletal: Multilevel degenerative changes affect the spine. Review of the MIP images confirms the above findings. IMPRESSION: 1. No evidence for pulmonary embolism. 2. Left solid pulmonary nodule measuring 10  mm. Per Fleischner Society Guidelines, consider a non-contrast Chest CT at 3 months, a PET/CT, or tissue sampling. These guidelines do not apply to immunocompromised patients and patients with cancer. Follow up in patients with  significant comorbidities as clinically warranted. For lung cancer screening, adhere to Lung-RADS guidelines. Reference: Radiology. 2017; 284(1):228-43. 3. Bosniak I benign renal cyst measuring 3 cm. No follow-up imaging is recommended. JACR 2018 Feb; 264-273, Management of the Incidental Renal Mass on CT, [employer] 2021; 814-848, Bosniak Classification of Cystic Renal Masses, Version 2019. Aortic Atherosclerosis (ICD10-I70.0). Electronically Signed   By: Darliss Cheney M.D.   On: 03/22/2023 20:42   ECHOCARDIOGRAM COMPLETE  Result Date: 03/22/2023    ECHOCARDIOGRAM REPORT   Patient Name:   MAKAELA PLUNK Date of Exam: 03/22/2023 Medical Rec #:  [id number]       Height:       67.0 in Accession #:    [id number]      Weight:       136.0 lb Date of Birth:  1930/11/06       BSA:          1.717 m Patient Age:    89 years        BP:           113/54 mmHg Patient Gender: F               HR:           68 bpm. Exam Location:  Inpatient Procedure: 2D Echo, Color Doppler and Cardiac Doppler Indications:    R94.31 Abnormal EKG  History:        Patient has no prior history of Echocardiogram examinations.                 Risk Factors:Hypertension, Diabetes and Dyslipidemia.  Sonographer:    Irving Burton Senior RDCS Referring Phys: [id number] VASUNDHRA RATHORE IMPRESSIONS  1. Left ventricular ejection fraction, by estimation, is 70 to 75%. Left ventricular ejection fraction by PLAX is 73 %. The left ventricle has hyperdynamic function. The left ventricle has no regional wall motion abnormalities. Left ventricular diastolic parameters are consistent with Grade I diastolic dysfunction (impaired relaxation). Elevated left atrial pressure.  2. Right ventricular systolic function is normal. The right ventricular size is normal. Tricuspid regurgitation signal is inadequate for assessing PA pressure.  3. The mitral valve is degenerative. Trivial mitral valve regurgitation. No evidence of mitral stenosis. The mean mitral valve gradient is 3.5  mmHg.  4. The aortic valve is tricuspid. Aortic valve regurgitation is trivial. Aortic valve sclerosis is present, with no evidence of aortic valve stenosis.  5. The inferior vena cava is normal in size with <50% respiratory variability, suggesting right atrial pressure of 8 mmHg. Comparison(s): No prior Echocardiogram. FINDINGS  Left Ventricle: Left ventricular ejection fraction, by estimation, is 70 to 75%. Left ventricular ejection fraction by PLAX is 73 %. The left ventricle has hyperdynamic function. The left ventricle has no regional wall motion abnormalities. The left ventricular internal cavity size was normal in size. There is no left ventricular hypertrophy. Left ventricular diastolic parameters are consistent with Grade I diastolic dysfunction (impaired relaxation). Elevated left atrial pressure. Right Ventricle: The right ventricular size is normal. Right ventricular systolic function is normal. Tricuspid regurgitation signal is inadequate for assessing PA pressure. Left Atrium: Left atrial size was normal in size. Right Atrium: Right atrial size was normal in size. Pericardium: There is no evidence of pericardial effusion. Mitral Valve: The mitral valve is degenerative  in appearance. There is moderate calcification of the mitral valve leaflet(s). Mild mitral annular calcification. Trivial mitral valve regurgitation. No evidence of mitral valve stenosis. MV peak gradient, 7.2 mmHg. The mean mitral valve gradient is 3.5 mmHg. Tricuspid Valve: The tricuspid valve is normal in structure. Tricuspid valve regurgitation is trivial. No evidence of tricuspid stenosis. Aortic Valve: The aortic valve is tricuspid. Aortic valve regurgitation is trivial. Aortic valve sclerosis is present, with no evidence of aortic valve stenosis. Aortic valve mean gradient measures 4.0 mmHg. Aortic valve peak gradient measures 9.1 mmHg. Aortic valve area, by VTI measures 3.45 cm. Pulmonic Valve: The pulmonic valve was normal in  structure. Pulmonic valve regurgitation is trivial. No evidence of pulmonic stenosis. Aorta: The aortic root and ascending aorta are structurally normal, with no evidence of dilitation. Venous: The inferior vena cava is normal in size with less than 50% respiratory variability, suggesting right atrial pressure of 8 mmHg. IAS/Shunts: No atrial level shunt detected by color flow Doppler.  LEFT VENTRICLE PLAX 2D LV EF:         Left            Diastology                ventricular     LV e' medial:    6.20 cm/s                ejection        LV E/e' medial:  15.3                fraction by     LV e' lateral:   7.62 cm/s                PLAX is 73      LV E/e' lateral: 12.4                %. LVIDd:         4.50 cm LVIDs:         2.60 cm LV PW:         0.90 cm LV IVS:        0.90 cm LVOT diam:     2.10 cm LV SV:         95 LV SV Index:   55 LVOT Area:     3.46 cm  RIGHT VENTRICLE RV S prime:     14.00 cm/s TAPSE (M-mode): 2.4 cm LEFT ATRIUM             Index        RIGHT ATRIUM           Index LA diam:        4.50 cm 2.62 cm/m   RA Area:     13.70 cm LA Vol (A2C):   56.6 ml 32.97 ml/m  RA Volume:   26.40 ml  15.38 ml/m LA Vol (A4C):   60.6 ml 35.30 ml/m LA Biplane Vol: 58.9 ml 34.31 ml/m  AORTIC VALVE AV Area (Vmax):    2.91 cm AV Area (Vmean):   3.12 cm AV Area (VTI):     3.45 cm AV Vmax:           151.00 cm/s AV Vmean:          90.200 cm/s AV VTI:            0.275 m AV Peak Grad:      9.1 mmHg AV Mean Grad:      4.0  mmHg LVOT Vmax:         127.00 cm/s LVOT Vmean:        81.200 cm/s LVOT VTI:          0.274 m LVOT/AV VTI ratio: 1.00  AORTA Ao Root diam: 2.90 cm Ao Asc diam:  3.20 cm MITRAL VALVE MV Area (PHT): 1.80 cm     SHUNTS MV Area VTI:   2.59 cm     Systemic VTI:  0.27 m MV Peak grad:  7.2 mmHg     Systemic Diam: 2.10 cm MV Mean grad:  3.5 mmHg MV Vmax:       1.34 m/s MV Vmean:      90.5 cm/s MV Decel Time: 422 msec MV E velocity: 94.70 cm/s MV A velocity: 138.00 cm/s MV E/A ratio:  0.69 Olga Millers  MD Electronically signed by Olga Millers MD Signature Date/Time: 03/22/2023/9:59:11 AM    Final    MR BRAIN WO CONTRAST  Result Date: 03/21/2023 CLINICAL DATA:  Initial evaluation for syncope/presyncope, stroke suspected. EXAM: MRI HEAD WITHOUT CONTRAST TECHNIQUE: Multiplanar, multiecho pulse sequences of the brain and surrounding structures were obtained without intravenous contrast. COMPARISON:  Prior CT from earlier the same day. FINDINGS: Brain: Diffuse prominence of the CSF containing spaces compatible generalized age-related cerebral atrophy. Patchy T2/FLAIR hyperintensity involving the periventricular and deep white matter, consistent with chronic small vessel ischemic disease, mild for age. No evidence for acute or subacute ischemia. Gray-white matter differentiation maintained. No areas of chronic cortical infarction. No acute intracranial hemorrhage. Few punctate chronic micro hemorrhages noted within the right cerebral hemisphere, of doubtful significance. No mass lesion, midline shift or mass effect. No hydrocephalus or extra-axial fluid collection. Pituitary gland suprasellar region within normal limits. Vascular: Major intracranial vascular flow voids are maintained. Skull and upper cervical spine: Junction with normal degenerative thickening with pannus formation noted about the dens and tectorial membrane. No high-grade spinal stenosis about the cervicomedullary junction. Bone marrow signal intensity within normal limits. No scalp soft tissue abnormality. Sinuses/Orbits: Prior bilateral ocular lens replacement. Paranasal sinuses are clear. No significant mastoid effusion. Other: None. IMPRESSION: 1. No acute intracranial abnormality. 2. Age-related cerebral atrophy with mild chronic small vessel ischemic disease. Electronically Signed   By: Rise Mu M.D.   On: 03/21/2023 20:53   DG Chest Portable 1 View  Result Date: 03/21/2023 CLINICAL DATA:  Dizziness EXAM: PORTABLE CHEST 1  VIEW COMPARISON:  None Available. FINDINGS: Lungs are well expanded, symmetric, and clear. No pneumothorax or pleural effusion. Cardiac size within normal limits. Pulmonary vascularity is normal. Osseous structures are age-appropriate. No acute bone abnormality. IMPRESSION: No active disease. Electronically Signed   By: Helyn Numbers M.D.   On: 03/21/2023 19:41   CT Head Wo Contrast  Result Date: 03/21/2023 CLINICAL DATA:  Headaches EXAM: CT HEAD WITHOUT CONTRAST TECHNIQUE: Contiguous axial images were obtained from the base of the skull through the vertex without intravenous contrast. RADIATION DOSE REDUCTION: This exam was performed according to the departmental dose-optimization program which includes automated exposure control, adjustment of the mA and/or kV according to patient size and/or use of iterative reconstruction technique. COMPARISON:  03/31/2021 FINDINGS: Brain: No evidence of acute infarction, hemorrhage, extra-axial collection, ventriculomegaly, or mass effect. Generalized cerebral atrophy. Periventricular white matter low attenuation likely secondary to microangiopathy. Vascular: Cerebrovascular atherosclerotic calcifications are noted. Skull: Negative for fracture or focal lesion. Mineralized pannus formation along the posterior aspect of the dens. Sinuses/Orbits: Visualized portions of the orbits are unremarkable. Visualized portions  of the paranasal sinuses are unremarkable. Visualized portions of the mastoid air cells are unremarkable. Other: None. IMPRESSION: 1. No acute intracranial pathology. 2. Chronic microvascular disease and cerebral atrophy. Electronically Signed   By: Elige Ko M.D.   On: 03/21/2023 18:47     LOS: 0 days   Lanae Boast, MD [facility]  03/23/2023, 3:58 PM

## 2023-03-23 NOTE — Care Management Obs Status (Signed)
MEDICARE OBSERVATION STATUS NOTIFICATION   Patient Details  Name: [name] MRN: [id number] Date of Birth: 08/07/1930   Medicare Observation Status Notification Given:  Yes    Tom-Johnson, Hershal Coria, RN 03/23/2023, 10:27 AM

## 2023-03-23 NOTE — Progress Notes (Addendum)
Patient Name: [name] Date of Encounter: 03/23/2023 [facility] Cardiologist: Nanetta Batty, MD   Interval Summary  .    89 y.o. female with a hx of hypertension, hyperlipidemia, PVD, GERD, dementia, type 2 diabetes now admitted with dizziness and troponins elevated in 40s and abnormal EKG with deep T wave inversions. .  Today on enteric precautions for some diarrhea but no further so c diff not done yet.    Vital Signs .    Vitals:   03/22/23 2000 03/23/23 0044 03/23/23 0345 03/23/23 0804  BP: 137/76 (!) 145/75 124/60 137/78  Pulse: 64 62 60   Resp:  18 16 18   Temp:  98.2 F (36.8 C) 98.2 F (36.8 C) 97.7 F (36.5 C)  TempSrc:  Oral Oral Oral  SpO2:  100% 99%   Weight:      Height:        Intake/Output Summary (Last 24 hours) at 03/23/2023 0810 Last data filed at 03/23/2023 0300 Gross per 24 hour  Intake 1303.64 ml  Output 500 ml  Net 803.64 ml      03/21/2023    4:30 PM 12/20/2020    3:30 PM 07/16/2020    3:01 PM  Last 3 Weights  Weight (lbs) 136 lb 141 lb 143 lb  Weight (kg) 61.689 kg 63.957 kg 64.864 kg      Telemetry/ECG    SR to SB with freq PACs short appearing PR - Personally Reviewed  EKG  atrial fib rate controlled,  nonspecific T wave abnormality  on tele SR with short PR and freq PACs   the T waves do not match tele or prior EKGs will repeat.    Echo 03/22/23 IMPRESSIONS     1. Left ventricular ejection fraction, by estimation, is 70 to 75%. Left  ventricular ejection fraction by PLAX is 73 %. The left ventricle has  hyperdynamic function. The left ventricle has no regional wall motion  abnormalities. Left ventricular  diastolic parameters are consistent with Grade I diastolic dysfunction  (impaired relaxation). Elevated left atrial pressure.   2. Right ventricular systolic function is normal. The right ventricular  size is normal. Tricuspid regurgitation signal is inadequate for assessing  PA pressure.   3. The mitral valve  is degenerative. Trivial mitral valve regurgitation.  No evidence of mitral stenosis. The mean mitral valve gradient is 3.5  mmHg.   4. The aortic valve is tricuspid. Aortic valve regurgitation is trivial.  Aortic valve sclerosis is present, with no evidence of aortic valve  stenosis.   5. The inferior vena cava is normal in size with <50% respiratory  variability, suggesting right atrial pressure of 8 mmHg.   Comparison(s): No prior Echocardiogram.   FINDINGS   Left Ventricle: Left ventricular ejection fraction, by estimation, is 70  to 75%. Left ventricular ejection fraction by PLAX is 73 %. The left  ventricle has hyperdynamic function. The left ventricle has no regional  wall motion abnormalities. The left  ventricular internal cavity size was normal in size. There is no left  ventricular hypertrophy. Left ventricular diastolic parameters are  consistent with Grade I diastolic dysfunction (impaired relaxation).  Elevated left atrial pressure.   Right Ventricle: The right ventricular size is normal. Right ventricular  systolic function is normal. Tricuspid regurgitation signal is inadequate  for assessing PA pressure.   Left Atrium: Left atrial size was normal in size.   Right Atrium: Right atrial size was normal in size.   Pericardium: There is  no evidence of pericardial effusion.   Mitral Valve: The mitral valve is degenerative in appearance. There is  moderate calcification of the mitral valve leaflet(s). Mild mitral annular  calcification. Trivial mitral valve regurgitation. No evidence of mitral  valve stenosis. MV peak gradient,  7.2 mmHg. The mean mitral valve gradient is 3.5 mmHg.   Tricuspid Valve: The tricuspid valve is normal in structure. Tricuspid  valve regurgitation is trivial. No evidence of tricuspid stenosis.   Aortic Valve: The aortic valve is tricuspid. Aortic valve regurgitation is  trivial. Aortic valve sclerosis is present, with no evidence of aortic   valve stenosis. Aortic valve mean gradient measures 4.0 mmHg. Aortic valve  peak gradient measures 9.1 mmHg.  Aortic valve area, by VTI measures 3.45 cm.   Pulmonic Valve: The pulmonic valve was normal in structure. Pulmonic valve  regurgitation is trivial. No evidence of pulmonic stenosis.   Aorta: The aortic root and ascending aorta are structurally normal, with  no evidence of dilitation.   Venous: The inferior vena cava is normal in size with less than 50%  respiratory variability, suggesting right atrial pressure of 8 mmHg.    Physical Exam .   GEN: No acute distress.   Neck: No JVD Cardiac: RRR, + premature beats, no murmurs, rubs, or gallops.  Respiratory: Clear to auscultation bilaterally. GI: Soft, nontender, non-distended  MS: No edema  Assessment & Plan .     Dizziness Diffuse T wave inversion, elevated trops (40s), prolonged Qtc 540s Symptoms sound consistent with vertigo however difficult to rule out possible ischemia.  Diffuse T wave inversions on EKG may be suggestive of LAD ischemia.  She had associated symptoms of ear and neck pain that sound atypical however cannot be certain this is not anginal equivalent.  She is also demented and may not be able to articulate symptoms well.  Fortunately echocardiogram shows hyperdynamic LVEF 70 to 75% with no regional wall motion abnormalities or significant valvular disease.    Will plan for CTA for the time being to further evaluate her coronary calcifications, suspect some degree of disease given her significant PVD and DM. Repeat EKG atrial fib not sure correct pt will recheck.  Avoid QTc prolonging medications.  Donepezil should be stopped or switched to something else given concerns for QTc prolongation and bradycardia.   Continue to correct potassium and magnesium as needed.  Currently normal. Goal: Magnesium greater than 2, potassium greater than 4.   PVD Left and right ABIs from August 2023 are 0.16, 0.11 respectively  indicating significant disease.  Will need to follow-up with Dr. Allyson Sabal. Will order lipid panel (ordered but not done)   Hypertension Continue to hold antihypertensives.  Though will give BB for cardiac cta   AKI with Cr to 1.28 today 1.10    Dementia Diabetes Per primary team For questions or updates, please contact [facility] Please consult [email or web address] for contact info under        Signed, Nada Boozer, NP   I have seen and examined the patient along with Nada Boozer, NP .  I have reviewed the chart, notes and new data.  I agree with PA/NP's note.  Key new complaints: no chest pain or dyspnea or dizziness Key examination changes: occasional ectopy Key new findings / data: the ECG from 03/23/2023 at 0440h is not hers. Different rhythm and different QRST morphology. Review of telemetry at the time ECG was performed shows SR w occ PACs at 60 bpm (never had AFib or  rate 98 bpm as is seen on the purported ECG) Reviewed an older CT chest - the degree of coronary calcifications is fairly mild, should not preclude a diagnostic CTA. Note nearly normalized renal function parameters and fewer PACs on telemetry since yesterday.  PLAN: Coronary CT angio today.   Thurmon Fair, MD, [facility] [phone] 03/23/2023, 12:04 PM

## 2023-03-23 NOTE — Progress Notes (Signed)
  Pt with abnormal cardiac CTA  will make NPO for possible cath but will defer to MD in AM.  Nada Boozer, FNP-C [facility]  Pgr:[phone] or after 5pm and on weekends call [phone] 03/23/2023.now

## 2023-03-23 NOTE — Plan of Care (Signed)

## 2023-03-23 NOTE — Progress Notes (Signed)
[name] (Daughter) set up a password: ANITA.

## 2023-03-24 ENCOUNTER — Other Ambulatory Visit (HOSPITAL_COMMUNITY): Payer: Self-pay

## 2023-03-24 DIAGNOSIS — R42 Dizziness and giddiness: Secondary | ICD-10-CM | POA: Diagnosis not present

## 2023-03-24 LAB — MAGNESIUM: Magnesium: 2 mg/dL (ref 1.7–2.4)

## 2023-03-24 LAB — GLUCOSE, CAPILLARY
Glucose-Capillary: 94 mg/dL (ref 70–99)
Glucose-Capillary: 159 mg/dL — ABNORMAL HIGH (ref 70–99)

## 2023-03-24 LAB — LIPID PANEL
Cholesterol: 177 mg/dL (ref 0–200)
HDL: 40 mg/dL — ABNORMAL LOW (ref >40)
LDL Cholesterol: 123 mg/dL — ABNORMAL HIGH (ref 0–99)
Total CHOL/HDL Ratio: 4.4 RATIO
Triglycerides: 72 mg/dL (ref <150)
VLDL: 14 mg/dL (ref 0–40)

## 2023-03-24 LAB — BASIC METABOLIC PANEL
Anion gap: 8 (ref 5–15)
BUN: 11 mg/dL (ref 8–23)
CO2: 21 mmol/L — ABNORMAL LOW (ref 22–32)
Calcium: 8.7 mg/dL — ABNORMAL LOW (ref 8.9–10.3)
Chloride: 110 mmol/L (ref 98–111)
Creatinine, Ser: 1.18 mg/dL — ABNORMAL HIGH (ref 0.44–1.00)
GFR, Estimated: 44 mL/min — ABNORMAL LOW (ref >60)
Glucose, Bld: 95 mg/dL (ref 70–99)
Potassium: 3.7 mmol/L (ref 3.5–5.1)
Sodium: 139 mmol/L (ref 135–145)

## 2023-03-24 MED ORDER — METOPROLOL SUCCINATE ER 25 MG PO TB24
12.5000 mg | ORAL_TABLET | Freq: Every day | ORAL | 0 refills | Status: DC
  Filled 2023-03-24: qty 15, 30d supply, fill #0

## 2023-03-24 MED ORDER — ATORVASTATIN CALCIUM 10 MG PO TABS
20.0000 mg | ORAL_TABLET | Freq: Every day | ORAL | Status: DC
  Administered 2023-03-24: 20 mg via ORAL
  Filled 2023-03-24: qty 2

## 2023-03-24 MED ORDER — ASPIRIN 81 MG PO TBEC
81.0000 mg | DELAYED_RELEASE_TABLET | Freq: Every day | ORAL | 12 refills | Status: DC
  Filled 2023-03-24: qty 120, 120d supply, fill #0

## 2023-03-24 MED ORDER — ATORVASTATIN CALCIUM 20 MG PO TABS
20.0000 mg | ORAL_TABLET | Freq: Every day | ORAL | 0 refills | Status: DC
  Filled 2023-03-24: qty 30, 30d supply, fill #0

## 2023-03-24 MED ORDER — ASPIRIN 81 MG PO TBEC
81.0000 mg | DELAYED_RELEASE_TABLET | Freq: Every day | ORAL | Status: DC
  Administered 2023-03-24: 81 mg via ORAL
  Filled 2023-03-24: qty 1

## 2023-03-24 MED ORDER — METOPROLOL SUCCINATE ER 25 MG PO TB24
12.5000 mg | ORAL_TABLET | Freq: Every day | ORAL | Status: DC
  Administered 2023-03-24: 12.5 mg via ORAL
  Filled 2023-03-24: qty 1

## 2023-03-24 NOTE — TOC Transition Note (Addendum)
Transition of Care [facility]) - CM/SW Discharge Note   Patient Details  Name: [name] MRN: [id number] Date of Birth: 1931-06-18  [facility] of [facility]) CM/SW Contact:  Lawerance Sabal, RN Phone Number: 03/24/2023, 1:32 PM   Clinical Narrative:     Patient set up with [facility] for [facility] services. Unable to reach daughter Magda Paganini by phone, unable to leave voicemail  Received call back, and informed Magda Paganini of [facility] services  Final next level of care: Home w Home Health Services Barriers to Discharge: No Barriers Identified   Patient Goals and CMS Choice CMS Medicare.gov Compare Post Acute Care list provided to:: Patient Choice offered to / list presented to : Patient  Discharge Placement                         Discharge Plan and Services Additional resources added to the After Visit Summary for                  DME Arranged: N/A         HH Arranged: PT, OT, Nurse's Aide HH Agency: [facility] Date [facility] Agency Contacted: 03/24/23 Time HH Agency Contacted: 1332 Representative spoke with at [facility] Agency: Kandee Keen  Social Determinants of Health (SDOH) Interventions SDOH Screenings   Food Insecurity: No Food Insecurity (03/22/2023)  Housing: Low Risk  (03/22/2023)  Transportation Needs: No Transportation Needs (03/22/2023)  Utilities: Not At Risk (03/22/2023)  Tobacco Use: Low Risk  (03/22/2023)     Readmission Risk Interventions     No data to display

## 2023-03-24 NOTE — Progress Notes (Signed)
Mobility Specialist Progress Note:    03/24/23 1357  Mobility  Activity Ambulated with assistance in hallway  Level of Assistance Standby assist, set-up cues, supervision of patient - no hands on  Assistive Device Cane  Distance Ambulated (ft) 300 ft  Activity Response Tolerated well  Mobility Referral Yes  $Mobility charge 1 Mobility  Mobility Specialist Start Time (ACUTE ONLY) 1217  Mobility Specialist Stop Time (ACUTE ONLY) 1227  Mobility Specialist Time Calculation (min) (ACUTE ONLY) 10 min   Received pt in bed having no complaints and agreeable to mobility. Pt was asymptomatic throughout ambulation and returned to room w/o fault. Left sitting EOB w/ call bell in reach and all needs met.   [place] Mobility Specialist  Please contact vis Secure Chat or  Rehab Office [phone]

## 2023-03-24 NOTE — Progress Notes (Addendum)
Patient Name: [name] Date of Encounter: 03/24/2023 [facility] Cardiologist: Nanetta Batty, MD   Interval Summary  .    89 year old female with past medical history of hypertension, hyperlipidemia, PVD, GERD, dementia, type 2 diabetes, cardiology is following for dizziness, elevated troponin, and abnormal EKG.  Gated CT from 03/23/2023 revealed severe stenosis in mid to distal RCA by FFR.  Plan is to medically manage CAD.  Notes course is complicated by AKI, diarrhea.   Patient states she feels well, dizziness occurred once and had resolved, diarrhea improving. She denied ever having any chest pain.   Vital Signs .    Vitals:   03/24/23 0335 03/24/23 0753 03/24/23 0800 03/24/23 0817  BP: (!) 143/105 131/66    Pulse: 62 (!) 55 (!) 53 60  Resp: 19 16    Temp: 98 F (36.7 C) 97.9 F (36.6 C)    TempSrc: Oral Oral    SpO2: 100% 100% 100% 100%  Weight: 60.2 kg     Height: 5\' 7"  (1.702 m)       Intake/Output Summary (Last 24 hours) at 03/24/2023 1019 Last data filed at 03/24/2023 0916 Gross per 24 hour  Intake 480 ml  Output --  Net 480 ml      03/24/2023    3:35 AM 03/21/2023    4:30 PM 12/20/2020    3:30 PM  Last 3 Weights  Weight (lbs) 132 lb 12.8 oz 136 lb 141 lb  Weight (kg) 60.238 kg 61.689 kg 63.957 kg      Telemetry/ECG    Sinus rhythm, sinus bradycardia 48 bpm at night  - Personally Reviewed  Physical Exam .   GEN: No acute distress.   Neck: No JVD Cardiac: RRR,soft systolic murmur  Respiratory: Clear to auscultation bilaterally. GI: Soft, nontender, non-distended  MS: No edema  Assessment & Plan .     Dizziness with abnormal EKG and elevated troponin CAD -Presented with vertigo-like symptoms, unclear if is due to underlying CAD - Hs trop  34>38 - EKG showed ST depression of inferior, anteroseptal, and lateral leads  - gate CT 8/20 with severe stenosis in mid to distal RCA  - Echo 03/22/23 with LVEF 70-75%, no RWMA, grade I DD, normal  RV - Lipid panel pending , A1C 6.9% - Plan is to medically manage as she has no chest pain with advanced age and CKD III, she is agreeable, will start ASA 81mg  daily, metoprolol XL 12.5 daily, resume PTA benazepril 40mg  daily, start lipitor 20 daily  - follow up with cardiology arranged on 04/07/23   QT prolongation -Avoid medication because of QT prolongation, PTA donepezil had been stopped   CKD III HTN DM Dementia - per primary team    For questions or updates, please contact [facility] Please consult [email or web address] for contact info under        Signed, Cyndi Bender, NP    I have seen and examined the patient along with Cyndi Bender, NP  .  I have reviewed the chart, notes and new data.  I agree with PA/NP's note.  Although ECG changes are striking, she does not have angina or a meaningful change in cardiac enzymes and the severe stenoses are limited to smaller coronary branches (PDA and OM). Plan medical therapy in this sedentary 89 year old with comorbid conditions.  PLAN: [facility] will sign off.   Medication Recommendations:  ASA 81 mg daily, atorvastatin 20 mg daily, metoprolol succinate 12.5 mg daily,  continue benazepril home dose. Other recommendations (labs, testing, etc):  recheck ECg at follow up visit Follow up as an outpatient:  2-4 weeks.   Thurmon Fair, MD, [facility] [phone] 03/24/2023, 11:12 AM

## 2023-03-25 NOTE — Discharge Summary (Signed)
Physician Discharge Summary  [name] VHQ:[id number] DOB: 07/14/1931 DOA: 03/21/2023  PCP: Irena Reichmann, DO  Admit date: 03/21/2023 Discharge date: 03/24/2023  Recommendations for Outpatient Follow-up:  Follow up with PCP and cardiology in 1 weeks-call for appointment Please obtain BMP/CBC in one week You have left solid pulmonary nodule 10 mm so advised noncontrast CT chest in 3 months or PET or CT scan or tissue sampling  Discharge Dispo: home Discharge Condition: Stable Code Status:   Code Status: Prior Diet recommendation:  Diet Order     None       Brief/Interim Summary: 89 y.o. female with medical history significant of hypertension, hyperlipidemia, PVD, GERD, dementia, type 2 diabetes presenting with a chief complaint of dizziness.  Patient reports chronic dizziness whenever she gets up to walk.  About a week ago, she was just laying in her bed which faces a mirror and as she looked at the mirror, all of a sudden she became very dizzy and her "head was swimming."  She had a headache and felt nauseous at that time but did not vomit.  This episode lasted about 5 minutes and she has not had any further dizziness this past week.  She has continued to have intermittent headaches.  Denies any episodes of chest pain.  Denies shortness of breath.  She reports having a cough 2 weeks ago which she thinks is due to her sitting in front of a fan which blew cold air on her face.  Cough has improved.  No fevers.  She reports ongoing diarrhea for several weeks.  Denies vomiting or abdominal pain.  Denies recent antibiotic use.  No other complaints. ED Course: Vital signs stable on arrival.  Labs showing no leukocytosis, hemoglobin 11.9 (was 14.6 on labs done 2 years ago), MCV 80.4, platelet count 187k, sodium 141, potassium 4.2, chloride 106, bicarb 23, BUN 12, creatinine 1.2, glucose 173, magnesium 1.9, SARS-CoV-2 PCR negative.  EKG showing sinus rhythm, QTc 503.  Deep T wave inversions in  anterior leads/diffuse T wave inversions new compared to previous EKG from 2 years ago. Troponin mildly elevated but stable (34> 38).  Chest x-ray showing no active disease.  CT head and brain MRI negative for acute finding. Patient is admitted echo was ordered She had brief hypotension  82/44 8/19 am and 1 L bolus ordered and BP stabilized Patient was dilated hydration orthostatic vitals were negative.  She did well with PT OT and ambulated. Her diarrhea is improving.  CT coronary resulted per cardiology although ECG changes are striking, she does not have angina or a meaningful change in cardiac enzymes and the severe stenoses are limited to smaller coronary branches (PDA and OM). Plan medical therapy in this sedentary 89 year old with comorbid conditions d/c meds: ASA 81 mg daily, atorvastatin 20 mg daily, metoprolol succinate 12.5 mg daily, continue benazepril home dose. Other recommendations (labs, testing, etc):  recheck ECg at follow up visit Follow up as an outpatient:  2-4 weeks   Discharge Diagnoses:  Principal Problem:   Dizziness Active Problems:   Essential hypertension   QT prolongation   Normocytic anemia   Dementia (HCC)   Type 2 diabetes mellitus (HCC)  Dizziness Diffuse T wave inversion Elevated troponins QT prolongation: Due to persistent abnormal EKG with T wave inversion, CT coronary CT angio chest obtained no PE.  Due to QTc prolongation advised to discontinue donepezil. CT coronary resulted per cardiology although ECG changes are striking, she does not have angina or a  meaningful change in cardiac enzymes and the severe stenoses are limited to smaller coronary branches (PDA and OM). Plan medical therapy in this sedentary 89 year old with comorbid conditions d/c meds: ASA 81 mg daily, atorvastatin 20 mg daily, metoprolol succinate 12.5 mg daily, continue benazepril home dose. Other recommendations (labs, testing, etc):  recheck ECg at follow up visit Follow up as an  outpatient:  2-4 week   PVD: Will need outpatient follow-up with Dr. Lubertha Basque and right ABIs from August 2023 are 0.16, 0.11 respectively indicating significant disease .  Lipid panel with LDL 123 continue statin    Hypertension: BP stable -cont meds as above w/ metoprolol. Ace stopped 2/2 aki.   AKI: Recent Labs  Lab 03/21/23 1656 03/22/23 0550 03/23/23 0025 03/24/23 0721  BUN 12 10 8 11   CREATININE 1.28* 1.25* 1.10* 1.18*    Dementia: Present since follow-up with PCP.  Her Aricept discontinued due to prolongation   Type 2 diabetes mellitus with hyperglycemia: Blood sugar is controlled.  Follow-up with PCP   Left solid pulmonary nodule 10 mm advised noncontrast CT chest in 3 months or PET or CT scan or tissue sampling. Daughter informed.   Mild normocytic anemia  Consults: Cardiology Subjective: Alert oriented resting comfortably eager to go home today  Discharge Exam: Vitals:   03/24/23 1200 03/24/23 1203  BP:  (!) 146/70  Pulse: 67 70  Resp:  (!) 22  Temp:  98.9 F (37.2 C)  SpO2: 99% 97%   General: Pt is alert, awake, not in acute distress Cardiovascular: RRR, S1/S2 +, no rubs, no gallops Respiratory: CTA bilaterally, no wheezing, no rhonchi Abdominal: Soft, NT, ND, bowel sounds + Extremities: no edema, no cyanosis  Discharge Instructions  Discharge Instructions     Discharge instructions   Complete by: As directed    Please call call MD or return to ER for similar or worsening recurring problem that brought you to hospital or if any fever,nausea/vomiting,abdominal pain, uncontrolled pain, chest pain,  shortness of breath or any other alarming symptoms.  Please follow-up your doctor as instructed in a week time and call the office for appointment.  Please avoid alcohol, smoking, or any other illicit substance and maintain healthy habits including taking your regular medications as prescribed.  You were cared for by a hospitalist during your hospital  stay. If you have any questions about your discharge medications or the care you received while you were in the hospital after you are discharged, you can call the unit and ask to speak with the hospitalist on call if the hospitalist that took care of you is not available.  Once you are discharged, your primary care physician will handle any further medical issues. Please note that NO REFILLS for any discharge medications will be authorized once you are discharged, as it is imperative that you return to your primary care physician (or establish a relationship with a primary care physician if you do not have one) for your aftercare needs so that they can reassess your need for medications and monitor your lab values   Discharge instructions   Complete by: As directed    Follow-up with cardiology as outpatient in 1 to 2 weeks.  Follow-up with family doctor in 1 week.  Left solid pulmonary nodule 10 mm advised noncontrast CT chest in 3 months or PET or CT scan or tissue sampling.  Please call call MD or return to ER for similar or worsening recurring problem that brought you to hospital  or if any fever,nausea/vomiting,abdominal pain, uncontrolled pain, chest pain,  shortness of breath or any other alarming symptoms.  Please follow-up your doctor as instructed in a week time and call the office for appointment.  Please avoid alcohol, smoking, or any other illicit substance and maintain healthy habits including taking your regular medications as prescribed.  You were cared for by a hospitalist during your hospital stay. If you have any questions about your discharge medications or the care you received while you were in the hospital after you are discharged, you can call the unit and ask to speak with the hospitalist on call if the hospitalist that took care of you is not available.  Once you are discharged, your primary care physician will handle any further medical issues. Please note that NO REFILLS  for any discharge medications will be authorized once you are discharged, as it is imperative that you return to your primary care physician (or establish a relationship with a primary care physician if you do not have one) for your aftercare needs so that they can reassess your need for medications and monitor your lab values   Increase activity slowly   Complete by: As directed    Increase activity slowly   Complete by: As directed       Allergies as of 03/24/2023   No Known Allergies      Medication List     STOP taking these medications    benazepril 40 MG tablet Commonly known as: LOTENSIN   donepezil 5 MG tablet Commonly known as: ARICEPT       TAKE these medications    aspirin EC 81 MG tablet Take 1 tablet (81 mg total) by mouth daily. Swallow whole.   atorvastatin 20 MG tablet Commonly known as: LIPITOR Take 1 tablet (20 mg total) by mouth daily.   metoprolol succinate 25 MG 24 hr tablet Commonly known as: TOPROL-XL Take 0.5 tablets (12.5 mg total) by mouth daily.   SYSTANE BALANCE OP Place 1 drop into both eyes daily as needed (for dry eye).        Follow-up Information     Irena Reichmann, DO Follow up in 1 week(s).   Specialty: Family Medicine Contact information: [address] [place] [address] [phone]         Croitoru, Rachelle Hora, MD Follow up in 1 week(s).   Specialty: Cardiology Contact information: [address] [place] [address] [phone]         [facility], [facility] Follow up.   Specialty: Home Health Services Why: for home health PT OT aid Contact information: [address] [place] [address] [phone]                No Known Allergies  The results of significant diagnostics from this hospitalization (including imaging, microbiology, ancillary and laboratory) are listed below for reference.    Microbiology: Recent Results (from the past 240 hour(s))   SARS Coronavirus 2 by RT PCR (hospital order, performed in [facility] hospital lab) *cepheid single result test* Anterior Nasal Swab     Status: None   Collection Time: 03/21/23  6:59 PM   Specimen: Anterior Nasal Swab  Result Value Ref Range Status   SARS Coronavirus 2 by RT PCR NEGATIVE NEGATIVE Final    Comment: Performed at [facility] Lab, [address] [place], [place] [address]    Procedures/Studies: CT CORONARY FFR DATA PREP & FLUID ANALYSIS  Result  Date: 03/23/2023 EXAM: FFRCT ANALYSIS FINDINGS: FFRct analysis was performed on the original cardiac CT angiogram dataset. Diagrammatic representation of the FFRct analysis is provided in a separate PDF document in PACS. This dictation was created using the PDF document and an interactive 3D model of the results. 3D model is not available in the EMR/PACS. Normal FFR range is >0.80. 1. Left Main: No significant stenosis 2. LAD: CTFFR 0.97 across lesion in proximal LAD. CTFFR 0.91 across lesion in diagonal branch 3. LCX: CTFFR 0.99 across lesion in proximal LCX, falls to 0.88 across lesion in mid to distal LCX 4. RCA: CTFFR 0.96 across lesion in proximal RCA, falls to 0.73 across lesion in mid to distal RCA IMPRESSION: 1.  CTFFR suggests severe stenosis in mid to distal RCA Electronically Signed   By: Epifanio Lesches M.D.   On: 03/23/2023 16:44   CT CORONARY MORPH W/CTA COR W/SCORE W/CA W/CM &/OR WO/CM  Result Date: 03/23/2023 CLINICAL DATA:  89F with abnormal EKG EXAM: Cardiac/Coronary CTA TECHNIQUE: The patient was scanned on a [employer]. FINDINGS: A 100 kV prospective scan was triggered in the descending thoracic aorta at 111 HU's. Axial non-contrast 3 mm slices were carried out through the heart. The data set was analyzed on a dedicated work station and scored using the Agatson method. Gantry rotation speed was 250 msecs and collimation was .6 mm. No beta blockade and 0.8 mg of sl NTG was given. The 3D data set was  reconstructed in 5% intervals of the 35-75% of the R-R cycle. Phases were analyzed on a dedicated work station using MPR, MIP and VRT modes. The patient received 100 cc of contrast. Coronary Arteries:  Normal coronary origin.  Right dominance. RCA is a large dominant artery that gives rise to PDA and PLA. Calcified plaque in proximal RCA causes 25-49% stenosis. Calcified plaque in mid RCA causes 50-69% stenosis. Calcified plaque in distal RCA causes 25-49% stenosis. Left main is a large artery that gives rise to LAD and LCX arteries. LAD is a large vessel. Calcified plaque in proximal LAD causes 25-49% stenosis. Calcified plaque in mid LAD causes 25-49% stenosis. Calcified plaque in distal LAD causes 25-49% stenosis. Calcified plaque in large diagonal branch causes 50-69% stenosis LCX is a non-dominant artery that gives rise to one large OM1 branch. Calcified plaque in proximal LCX causes 25-49% stenosis. Calcified plaque in mid LCX causes 25-49% stenosis. Calcified plaque in distal LCX causes 25-49% stenosis. Other findings: Left Ventricle: Normal size Left Atrium: Normal size Pulmonary Veins: Normal configuration Right Ventricle: Normal size Right Atrium: Normal size Thoracic aorta: Normal size Pulmonary Arteries: Dilated main PA measuring 31mm Systemic Veins: Normal drainage Pericardium: Normal thickness IMPRESSION: 1. Poor quality study. Significant step artifacts due to respiratory motion during image acquisition, along with extensive coronary calcifications affects interpretability of study 2. Coronary calcium score of 3341. Percentile not available for age over 1 3. Total plaque volume 2249mm3 which is 99th percentile for age and sex-matched controls (calcified plaque 456mm3; noncalcified plaque 1891mm3). TPV is extensive 4.  Normal coronary origin with right dominance. 5. Moderate (50-69%) stenosis in mid RCA and large diagonal branch. Will send study for CTFFR 6.  Dilated main pulmonary artery measuring  31mm CAD-RADS 3. Moderate stenosis. Consider symptom-guided anti-ischemic pharmacotherapy as well as risk factor modification per guideline directed care. Additional analysis with CT FFR will be submitted. Electronically Signed   By: Epifanio Lesches M.D.   On: 03/23/2023 16:31   CT Angio Chest Pulmonary  Embolism (PE) W or WO Contrast  Result Date: 03/22/2023 CLINICAL DATA:  Abnormal EKG.  Dizziness. EXAM: CT ANGIOGRAPHY CHEST WITH CONTRAST TECHNIQUE: Multidetector CT imaging of the chest was performed using the standard protocol during bolus administration of intravenous contrast. Multiplanar CT image reconstructions and MIPs were obtained to evaluate the vascular anatomy. RADIATION DOSE REDUCTION: This exam was performed according to the departmental dose-optimization program which includes automated exposure control, adjustment of the mA and/or kV according to patient size and/or use of iterative reconstruction technique. CONTRAST:  65mL OMNIPAQUE IOHEXOL 350 MG/ML SOLN COMPARISON:  CT chest abdomen and pelvis 09/01/2009 FINDINGS: Cardiovascular: Satisfactory opacification of the pulmonary arteries to the segmental level. No evidence of pulmonary embolism. Normal heart size. No pericardial effusion. There are atherosclerotic calcifications of the aorta. Mediastinum/Nodes: No enlarged mediastinal, hilar, or axillary lymph nodes. Thyroid gland, trachea, and esophagus demonstrate no significant findings. Lungs/Pleura: There is a rounded 1 cm nodule in the lingula. Lungs are otherwise clear. No pleural effusion or pneumothorax. Upper Abdomen: No acute abnormality. Cholecystectomy clips are present. There is a 3 cm left renal cyst. Musculoskeletal: Multilevel degenerative changes affect the spine. Review of the MIP images confirms the above findings. IMPRESSION: 1. No evidence for pulmonary embolism. 2. Left solid pulmonary nodule measuring 10 mm. Per Fleischner Society Guidelines, consider a non-contrast  Chest CT at 3 months, a PET/CT, or tissue sampling. These guidelines do not apply to immunocompromised patients and patients with cancer. Follow up in patients with significant comorbidities as clinically warranted. For lung cancer screening, adhere to Lung-RADS guidelines. Reference: Radiology. 2017; 284(1):228-43. 3. Bosniak I benign renal cyst measuring 3 cm. No follow-up imaging is recommended. JACR 2018 Feb; 264-273, Management of the Incidental Renal Mass on CT, [employer] 2021; 814-848, Bosniak Classification of Cystic Renal Masses, Version 2019. Aortic Atherosclerosis (ICD10-I70.0). Electronically Signed   By: Darliss Cheney M.D.   On: 03/22/2023 20:42   ECHOCARDIOGRAM COMPLETE  Result Date: 03/22/2023    ECHOCARDIOGRAM REPORT   Patient Name:   BREEANN REYES Date of Exam: 03/22/2023 Medical Rec #:  [id number]       Height:       67.0 in Accession #:    [id number]      Weight:       136.0 lb Date of Birth:  12-Dec-1930       BSA:          1.717 m Patient Age:    89 years        BP:           113/54 mmHg Patient Gender: F               HR:           68 bpm. Exam Location:  Inpatient Procedure: 2D Echo, Color Doppler and Cardiac Doppler Indications:    R94.31 Abnormal EKG  History:        Patient has no prior history of Echocardiogram examinations.                 Risk Factors:Hypertension, Diabetes and Dyslipidemia.  Sonographer:    Irving Burton Senior RDCS Referring Phys: [id number] VASUNDHRA RATHORE IMPRESSIONS  1. Left ventricular ejection fraction, by estimation, is 70 to 75%. Left ventricular ejection fraction by PLAX is 73 %. The left ventricle has hyperdynamic function. The left ventricle has no regional wall motion abnormalities. Left ventricular diastolic parameters are consistent with Grade I diastolic dysfunction (impaired relaxation). Elevated left atrial pressure.  2. Right ventricular  systolic function is normal. The right ventricular size is normal. Tricuspid regurgitation signal is inadequate for  assessing PA pressure.  3. The mitral valve is degenerative. Trivial mitral valve regurgitation. No evidence of mitral stenosis. The mean mitral valve gradient is 3.5 mmHg.  4. The aortic valve is tricuspid. Aortic valve regurgitation is trivial. Aortic valve sclerosis is present, with no evidence of aortic valve stenosis.  5. The inferior vena cava is normal in size with <50% respiratory variability, suggesting right atrial pressure of 8 mmHg. Comparison(s): No prior Echocardiogram. FINDINGS  Left Ventricle: Left ventricular ejection fraction, by estimation, is 70 to 75%. Left ventricular ejection fraction by PLAX is 73 %. The left ventricle has hyperdynamic function. The left ventricle has no regional wall motion abnormalities. The left ventricular internal cavity size was normal in size. There is no left ventricular hypertrophy. Left ventricular diastolic parameters are consistent with Grade I diastolic dysfunction (impaired relaxation). Elevated left atrial pressure. Right Ventricle: The right ventricular size is normal. Right ventricular systolic function is normal. Tricuspid regurgitation signal is inadequate for assessing PA pressure. Left Atrium: Left atrial size was normal in size. Right Atrium: Right atrial size was normal in size. Pericardium: There is no evidence of pericardial effusion. Mitral Valve: The mitral valve is degenerative in appearance. There is moderate calcification of the mitral valve leaflet(s). Mild mitral annular calcification. Trivial mitral valve regurgitation. No evidence of mitral valve stenosis. MV peak gradient, 7.2 mmHg. The mean mitral valve gradient is 3.5 mmHg. Tricuspid Valve: The tricuspid valve is normal in structure. Tricuspid valve regurgitation is trivial. No evidence of tricuspid stenosis. Aortic Valve: The aortic valve is tricuspid. Aortic valve regurgitation is trivial. Aortic valve sclerosis is present, with no evidence of aortic valve stenosis. Aortic valve mean  gradient measures 4.0 mmHg. Aortic valve peak gradient measures 9.1 mmHg. Aortic valve area, by VTI measures 3.45 cm. Pulmonic Valve: The pulmonic valve was normal in structure. Pulmonic valve regurgitation is trivial. No evidence of pulmonic stenosis. Aorta: The aortic root and ascending aorta are structurally normal, with no evidence of dilitation. Venous: The inferior vena cava is normal in size with less than 50% respiratory variability, suggesting right atrial pressure of 8 mmHg. IAS/Shunts: No atrial level shunt detected by color flow Doppler.  LEFT VENTRICLE PLAX 2D LV EF:         Left            Diastology                ventricular     LV e' medial:    6.20 cm/s                ejection        LV E/e' medial:  15.3                fraction by     LV e' lateral:   7.62 cm/s                PLAX is 73      LV E/e' lateral: 12.4                %. LVIDd:         4.50 cm LVIDs:         2.60 cm LV PW:         0.90 cm LV IVS:        0.90 cm LVOT diam:     2.10 cm LV SV:  95 LV SV Index:   55 LVOT Area:     3.46 cm  RIGHT VENTRICLE RV S prime:     14.00 cm/s TAPSE (M-mode): 2.4 cm LEFT ATRIUM             Index        RIGHT ATRIUM           Index LA diam:        4.50 cm 2.62 cm/m   RA Area:     13.70 cm LA Vol (A2C):   56.6 ml 32.97 ml/m  RA Volume:   26.40 ml  15.38 ml/m LA Vol (A4C):   60.6 ml 35.30 ml/m LA Biplane Vol: 58.9 ml 34.31 ml/m  AORTIC VALVE AV Area (Vmax):    2.91 cm AV Area (Vmean):   3.12 cm AV Area (VTI):     3.45 cm AV Vmax:           151.00 cm/s AV Vmean:          90.200 cm/s AV VTI:            0.275 m AV Peak Grad:      9.1 mmHg AV Mean Grad:      4.0 mmHg LVOT Vmax:         127.00 cm/s LVOT Vmean:        81.200 cm/s LVOT VTI:          0.274 m LVOT/AV VTI ratio: 1.00  AORTA Ao Root diam: 2.90 cm Ao Asc diam:  3.20 cm MITRAL VALVE MV Area (PHT): 1.80 cm     SHUNTS MV Area VTI:   2.59 cm     Systemic VTI:  0.27 m MV Peak grad:  7.2 mmHg     Systemic Diam: 2.10 cm MV Mean grad:  3.5  mmHg MV Vmax:       1.34 m/s MV Vmean:      90.5 cm/s MV Decel Time: 422 msec MV E velocity: 94.70 cm/s MV A velocity: 138.00 cm/s MV E/A ratio:  0.69 Olga Millers MD Electronically signed by Olga Millers MD Signature Date/Time: 03/22/2023/9:59:11 AM    Final    MR BRAIN WO CONTRAST  Result Date: 03/21/2023 CLINICAL DATA:  Initial evaluation for syncope/presyncope, stroke suspected. EXAM: MRI HEAD WITHOUT CONTRAST TECHNIQUE: Multiplanar, multiecho pulse sequences of the brain and surrounding structures were obtained without intravenous contrast. COMPARISON:  Prior CT from earlier the same day. FINDINGS: Brain: Diffuse prominence of the CSF containing spaces compatible generalized age-related cerebral atrophy. Patchy T2/FLAIR hyperintensity involving the periventricular and deep white matter, consistent with chronic small vessel ischemic disease, mild for age. No evidence for acute or subacute ischemia. Gray-white matter differentiation maintained. No areas of chronic cortical infarction. No acute intracranial hemorrhage. Few punctate chronic micro hemorrhages noted within the right cerebral hemisphere, of doubtful significance. No mass lesion, midline shift or mass effect. No hydrocephalus or extra-axial fluid collection. Pituitary gland suprasellar region within normal limits. Vascular: Major intracranial vascular flow voids are maintained. Skull and upper cervical spine: Junction with normal degenerative thickening with pannus formation noted about the dens and tectorial membrane. No high-grade spinal stenosis about the cervicomedullary junction. Bone marrow signal intensity within normal limits. No scalp soft tissue abnormality. Sinuses/Orbits: Prior bilateral ocular lens replacement. Paranasal sinuses are clear. No significant mastoid effusion. Other: None. IMPRESSION: 1. No acute intracranial abnormality. 2. Age-related cerebral atrophy with mild chronic small vessel ischemic disease. Electronically  Signed   By: Sharlet Salina  Phill Myron M.D.   On: 03/21/2023 20:53   DG Chest Portable 1 View  Result Date: 03/21/2023 CLINICAL DATA:  Dizziness EXAM: PORTABLE CHEST 1 VIEW COMPARISON:  None Available. FINDINGS: Lungs are well expanded, symmetric, and clear. No pneumothorax or pleural effusion. Cardiac size within normal limits. Pulmonary vascularity is normal. Osseous structures are age-appropriate. No acute bone abnormality. IMPRESSION: No active disease. Electronically Signed   By: Helyn Numbers M.D.   On: 03/21/2023 19:41   CT Head Wo Contrast  Result Date: 03/21/2023 CLINICAL DATA:  Headaches EXAM: CT HEAD WITHOUT CONTRAST TECHNIQUE: Contiguous axial images were obtained from the base of the skull through the vertex without intravenous contrast. RADIATION DOSE REDUCTION: This exam was performed according to the departmental dose-optimization program which includes automated exposure control, adjustment of the mA and/or kV according to patient size and/or use of iterative reconstruction technique. COMPARISON:  03/31/2021 FINDINGS: Brain: No evidence of acute infarction, hemorrhage, extra-axial collection, ventriculomegaly, or mass effect. Generalized cerebral atrophy. Periventricular white matter low attenuation likely secondary to microangiopathy. Vascular: Cerebrovascular atherosclerotic calcifications are noted. Skull: Negative for fracture or focal lesion. Mineralized pannus formation along the posterior aspect of the dens. Sinuses/Orbits: Visualized portions of the orbits are unremarkable. Visualized portions of the paranasal sinuses are unremarkable. Visualized portions of the mastoid air cells are unremarkable. Other: None. IMPRESSION: 1. No acute intracranial pathology. 2. Chronic microvascular disease and cerebral atrophy. Electronically Signed   By: Elige Ko M.D.   On: 03/21/2023 18:47    Labs: BNP (last 3 results) No results for input(s): "BNP" in the last 8760 hours. Basic Metabolic  Panel: Recent Labs  Lab 03/21/23 1656 03/21/23 1745 03/22/23 0550 03/23/23 0025 03/24/23 0721  NA 141  --  142 139 139  K 4.2  --  3.8 3.8 3.7  CL 106  --  108 109 110  CO2 23  --  23 23 21*  GLUCOSE 173*  --  83 92 95  BUN 12  --  10 8 11   CREATININE 1.28*  --  1.25* 1.10* 1.18*  CALCIUM 9.7  --  9.0 8.6* 8.7*  MG  --  1.9  --  1.9 2.0   Liver Function Tests: Recent Labs  Lab 03/21/23 1656  AST 22  ALT 19  ALKPHOS 59  BILITOT 1.2  PROT 6.4*  ALBUMIN 3.7   No results for input(s): "LIPASE", "AMYLASE" in the last 168 hours. No results for input(s): "AMMONIA" in the last 168 hours. CBC: Recent Labs  Lab 03/21/23 1656 03/22/23 0550  WBC 5.3 4.4  NEUTROABS 2.3  --   HGB 11.9* 11.6*  HCT 36.6 35.6*  MCV 80.4 83.4  PLT 187 156  CBG: Recent Labs  Lab 03/23/23 1630 03/23/23 2057 03/23/23 2109 03/24/23 0750 03/24/23 1133  GLUCAP 125* 154* 150* 94 159*   D-Dimer No results for input(s): "DDIMER" in the last 72 hours. Hgb A1c No results for input(s): "HGBA1C" in the last 72 hours.  Lipid Profile Recent Labs    03/24/23 0721  CHOL 177  HDL 40*  LDLCALC 123*  TRIG 72  CHOLHDL 4.4      Component Value Date/Time   COLORURINE YELLOW 03/21/2023 2151   APPEARANCEUR CLEAR 03/21/2023 2151   LABSPEC 1.006 03/21/2023 2151   PHURINE 8.0 03/21/2023 2151   GLUCOSEU NEGATIVE 03/21/2023 2151   HGBUR NEGATIVE 03/21/2023 2151   BILIRUBINUR NEGATIVE 03/21/2023 2151   KETONESUR NEGATIVE 03/21/2023 2151   PROTEINUR NEGATIVE 03/21/2023 2151   UROBILINOGEN  1.0 08/29/2007 1525   NITRITE NEGATIVE 03/21/2023 2151   LEUKOCYTESUR NEGATIVE 03/21/2023 2151   Sepsis Labs Recent Labs  Lab 03/21/23 1656 03/22/23 0550  WBC 5.3 4.4   Microbiology Recent Results (from the past 240 hour(s))  SARS Coronavirus 2 by RT PCR (hospital order, performed in [facility] hospital lab) *cepheid single result test* Anterior Nasal Swab     Status: None   Collection Time: 03/21/23  6:59  PM   Specimen: Anterior Nasal Swab  Result Value Ref Range Status   SARS Coronavirus 2 by RT PCR NEGATIVE NEGATIVE Final    Comment: Performed at [facility], [address] [place], [place] [address]   Time coordinating discharge: 25 minutes  SIGNED: Lanae Boast, MD  [facility] 03/25/2023, 1:56 PM  If 7PM-7AM, please contact night-coverage [email or web address]

## 2023-03-26 NOTE — ED Provider Notes (Signed)
[place] [facility] Provider Note   CSN: [id number] Arrival date & time: 03/21/23  1622     History  Chief Complaint  Patient presents with   Dizziness    Pt coming from home via EMS for dizziness x one week. Pt is a&ox4, warm and dry to touch. Pt states that at one time she was experiencing neck pain that radiated into her left arm. She does not currently endorse this. No neuro def noted.     [name] is a 89 y.o. female.  HPI     89yo  female with history of DM, htn, hlpd, dementia presents with concern for dizziness.  Presents with concern for for episodes of dizziness. Had an episode today at church, with nausea, diaphoresis.  Generalized weakness, room spinning episode. Denies chest pain or shortness of breath. Denies numbness, weakness, change in vision, speech. Denies black or bloody stools, fever. Did have some diarrhea.  No cough.     Past Medical History:  Diagnosis Date   Acid reflux    Arthritis    Dementia (HCC)    Diabetes mellitus without complication (HCC)    High cholesterol    Hypertension      Home Medications Prior to Admission medications   Medication Sig Start Date End Date Taking? Authorizing Provider  Propylene Glycol (SYSTANE BALANCE OP) Place 1 drop into both eyes daily as needed (for dry eye).   Yes [provider]  aspirin EC 81 MG tablet Take 1 tablet (81 mg total) by mouth daily. Swallow whole. 03/24/23   Lanae Boast, MD  atorvastatin (LIPITOR) 20 MG tablet Take 1 tablet (20 mg total) by mouth daily. 03/24/23 04/23/23  Lanae Boast, MD  metoprolol succinate (TOPROL-XL) 25 MG 24 hr tablet Take 0.5 tablets (12.5 mg total) by mouth daily. 03/24/23 04/23/23  Lanae Boast, MD      Allergies    Patient has no known allergies.    Review of Systems   Review of Systems  Physical Exam Updated Vital Signs BP (!) 146/70 (BP Location: Right Arm)   Pulse 70   Temp 98.9 F (37.2 C) (Oral)   Resp (!) 22   Ht 5\' 7"  (1.702 m)    Wt 60.2 kg   SpO2 97%   BMI 20.80 kg/m  Physical Exam Constitutional:      General: She is not in acute distress.    Appearance: Normal appearance. She is not ill-appearing.  HENT:     Head: Normocephalic and atraumatic.  Eyes:     General: No visual field deficit.    Extraocular Movements: Extraocular movements intact.     Conjunctiva/sclera: Conjunctivae normal.     Pupils: Pupils are equal, round, and reactive to light.  Cardiovascular:     Rate and Rhythm: Normal rate and regular rhythm.     Pulses: Normal pulses.  Pulmonary:     Effort: Pulmonary effort is normal. No respiratory distress.  Musculoskeletal:        General: No swelling or tenderness.     Cervical back: Normal range of motion.  Skin:    General: Skin is warm and dry.     Findings: No erythema or rash.  Neurological:     General: No focal deficit present.     Mental Status: She is alert and oriented to person, place, and time.     GCS: GCS eye subscore is 4. GCS verbal subscore is 5. GCS motor subscore is 6.  Cranial Nerves: No cranial nerve deficit, dysarthria or facial asymmetry.     Sensory: No sensory deficit.     Motor: No weakness or tremor.     Coordination: Coordination normal. Finger-Nose-Finger Test normal.     Gait: Abnormal gait: deferred due to dizziness.     ED Results / Procedures / Treatments   Labs (all labs ordered are listed, but only abnormal results are displayed) Labs Reviewed  CBC WITH DIFFERENTIAL/PLATELET - Abnormal; Notable for the following components:      Result Value   Hemoglobin 11.9 (*)    RDW 17.5 (*)    All other components within normal limits  COMPREHENSIVE METABOLIC PANEL - Abnormal; Notable for the following components:   Glucose, Bld 173 (*)    Creatinine, Ser 1.28 (*)    Total Protein 6.4 (*)    GFR, Estimated 40 (*)    All other components within normal limits  CBC - Abnormal; Notable for the following components:   Hemoglobin 11.6 (*)    HCT 35.6 (*)     RDW 17.9 (*)    All other components within normal limits  BASIC METABOLIC PANEL - Abnormal; Notable for the following components:   Creatinine, Ser 1.25 (*)    GFR, Estimated 41 (*)    All other components within normal limits  HEMOGLOBIN A1C - Abnormal; Notable for the following components:   Hgb A1c MFr Bld 6.9 (*)    All other components within normal limits  BASIC METABOLIC PANEL - Abnormal; Notable for the following components:   Creatinine, Ser 1.10 (*)    Calcium 8.6 (*)    GFR, Estimated 48 (*)    All other components within normal limits  GLUCOSE, CAPILLARY - Abnormal; Notable for the following components:   Glucose-Capillary 101 (*)    All other components within normal limits  GLUCOSE, CAPILLARY - Abnormal; Notable for the following components:   Glucose-Capillary 125 (*)    All other components within normal limits  BASIC METABOLIC PANEL - Abnormal; Notable for the following components:   CO2 21 (*)    Creatinine, Ser 1.18 (*)    Calcium 8.7 (*)    GFR, Estimated 44 (*)    All other components within normal limits  GLUCOSE, CAPILLARY - Abnormal; Notable for the following components:   Glucose-Capillary 154 (*)    All other components within normal limits  GLUCOSE, CAPILLARY - Abnormal; Notable for the following components:   Glucose-Capillary 150 (*)    All other components within normal limits  LIPID PANEL - Abnormal; Notable for the following components:   HDL 40 (*)    LDL Cholesterol 123 (*)    All other components within normal limits  CBG MONITORING, ED - Abnormal; Notable for the following components:   Glucose-Capillary 229 (*)    All other components within normal limits  CBG MONITORING, ED - Abnormal; Notable for the following components:   Glucose-Capillary 111 (*)    All other components within normal limits  TROPONIN I (HIGH SENSITIVITY) - Abnormal; Notable for the following components:   Troponin I (High Sensitivity) 34 (*)    All other  components within normal limits  TROPONIN I (HIGH SENSITIVITY) - Abnormal; Notable for the following components:   Troponin I (High Sensitivity) 38 (*)    All other components within normal limits  SARS CORONAVIRUS 2 BY RT PCR  GASTROINTESTINAL PANEL BY PCR, STOOL (REPLACES STOOL CULTURE)  MAGNESIUM  URINALYSIS, ROUTINE W REFLEX MICROSCOPIC  GLUCOSE, CAPILLARY  MAGNESIUM  GLUCOSE, CAPILLARY  MAGNESIUM  CBG MONITORING, ED    EKG EKG Interpretation Date/Time:  Monday March 22 2023 14:06:43 EDT Ventricular Rate:  69 PR Interval:  195 QRS Duration:  96 QT Interval:  505 QTC Calculation: 542 R Axis:   50  Text Interpretation: Sinus rhythm Atrial premature complexes Abnormal T, probable ischemia, widespread Prolonged QT interval Confirmed by Alona Bene [phone]) on 03/23/2023 12:25:52 PM  Radiology No results found.  Procedures Procedures    Medications Ordered in ED Medications  0.9 %  sodium chloride infusion ( Intravenous Infusion Verify 03/22/23 2100)  magnesium sulfate IVPB 1 g 100 mL (0 g Intravenous Stopped 03/22/23 0149)  sodium chloride 0.9 % bolus 1,000 mL (0 mLs Intravenous Stopped 03/22/23 0647)  iohexol (OMNIPAQUE) 350 MG/ML injection 65 mL (65 mLs Intravenous Contrast Given 03/22/23 1939)  metoprolol succinate (TOPROL-XL) 24 hr tablet 25 mg (25 mg Oral Given 03/23/23 0943)  nitroGLYCERIN (NITROSTAT) SL tablet 0.8 mg (0.8 mg Sublingual Given 03/23/23 1105)  iohexol (OMNIPAQUE) 350 MG/ML injection 80 mL (80 mLs Intravenous Contrast Given 03/23/23 1118)    ED Course/ Medical Decision Making/ A&P                                  89yo  female with history of DM, htn, hlpd, dementia presents with concern for dizziness.  Differential diagnosis includes anemia, electrolyte abnormality, cardiac abnormality/ACS, infection, hypothyroidism, other toxic/metabolic abnormalities, CVA, dehydration.  Low clinical suspicion for PE with no cp or dyspnea.    Labs completed and  personally evaluated by me show no clinically significant electrolyte abnormalities, Cr mildly increased, mild anemia, COVID negative.  Troponin mildly elevated and stable.  Urinalysis shows no sign of infection.. Chest x-ray shows no acute findings.   CT head completed and showing no sign of ICH.  Given dizziness with room spinning, ordered MRI to evaluate for CVA as etiology of dizziness shows no sign of CVA.    ECG with significant TW inversions which are new from prior.  Given dizziness, new ECG changes, mildly elevated troponin, will admit for continued care.             Final Clinical Impression(s) / ED Diagnoses Final diagnoses:  Dizziness  Abnormal EKG  Troponin level elevated    Rx / DC Orders    Alvira Monday, MD 03/26/23 2352

## 2023-03-28 DIAGNOSIS — K219 Gastro-esophageal reflux disease without esophagitis: Secondary | ICD-10-CM | POA: Diagnosis not present

## 2023-03-28 DIAGNOSIS — E1151 Type 2 diabetes mellitus with diabetic peripheral angiopathy without gangrene: Secondary | ICD-10-CM | POA: Diagnosis not present

## 2023-03-28 DIAGNOSIS — L89616 Pressure-induced deep tissue damage of right heel: Secondary | ICD-10-CM | POA: Diagnosis not present

## 2023-03-28 DIAGNOSIS — E1142 Type 2 diabetes mellitus with diabetic polyneuropathy: Secondary | ICD-10-CM | POA: Diagnosis not present

## 2023-03-28 DIAGNOSIS — E7800 Pure hypercholesterolemia, unspecified: Secondary | ICD-10-CM | POA: Diagnosis not present

## 2023-03-28 DIAGNOSIS — Z9181 History of falling: Secondary | ICD-10-CM | POA: Diagnosis not present

## 2023-03-28 DIAGNOSIS — I10 Essential (primary) hypertension: Secondary | ICD-10-CM | POA: Diagnosis not present

## 2023-03-28 DIAGNOSIS — L89626 Pressure-induced deep tissue damage of left heel: Secondary | ICD-10-CM | POA: Diagnosis not present

## 2023-03-28 DIAGNOSIS — D649 Anemia, unspecified: Secondary | ICD-10-CM | POA: Diagnosis not present

## 2023-03-28 DIAGNOSIS — Z7982 Long term (current) use of aspirin: Secondary | ICD-10-CM | POA: Diagnosis not present

## 2023-03-29 DIAGNOSIS — L89616 Pressure-induced deep tissue damage of right heel: Secondary | ICD-10-CM | POA: Diagnosis not present

## 2023-03-29 DIAGNOSIS — E1151 Type 2 diabetes mellitus with diabetic peripheral angiopathy without gangrene: Secondary | ICD-10-CM | POA: Diagnosis not present

## 2023-03-29 DIAGNOSIS — Z9181 History of falling: Secondary | ICD-10-CM | POA: Diagnosis not present

## 2023-03-29 DIAGNOSIS — Z7982 Long term (current) use of aspirin: Secondary | ICD-10-CM | POA: Diagnosis not present

## 2023-03-29 DIAGNOSIS — L89626 Pressure-induced deep tissue damage of left heel: Secondary | ICD-10-CM | POA: Diagnosis not present

## 2023-03-29 DIAGNOSIS — K219 Gastro-esophageal reflux disease without esophagitis: Secondary | ICD-10-CM | POA: Diagnosis not present

## 2023-03-29 DIAGNOSIS — E7800 Pure hypercholesterolemia, unspecified: Secondary | ICD-10-CM | POA: Diagnosis not present

## 2023-03-29 DIAGNOSIS — E1142 Type 2 diabetes mellitus with diabetic polyneuropathy: Secondary | ICD-10-CM | POA: Diagnosis not present

## 2023-03-29 DIAGNOSIS — I10 Essential (primary) hypertension: Secondary | ICD-10-CM | POA: Diagnosis not present

## 2023-03-29 DIAGNOSIS — D649 Anemia, unspecified: Secondary | ICD-10-CM | POA: Diagnosis not present

## 2023-03-30 DIAGNOSIS — Z7982 Long term (current) use of aspirin: Secondary | ICD-10-CM | POA: Diagnosis not present

## 2023-03-30 DIAGNOSIS — K219 Gastro-esophageal reflux disease without esophagitis: Secondary | ICD-10-CM | POA: Diagnosis not present

## 2023-03-30 DIAGNOSIS — L89616 Pressure-induced deep tissue damage of right heel: Secondary | ICD-10-CM | POA: Diagnosis not present

## 2023-03-30 DIAGNOSIS — Z9181 History of falling: Secondary | ICD-10-CM | POA: Diagnosis not present

## 2023-03-30 DIAGNOSIS — E1151 Type 2 diabetes mellitus with diabetic peripheral angiopathy without gangrene: Secondary | ICD-10-CM | POA: Diagnosis not present

## 2023-03-30 DIAGNOSIS — E1142 Type 2 diabetes mellitus with diabetic polyneuropathy: Secondary | ICD-10-CM | POA: Diagnosis not present

## 2023-03-30 DIAGNOSIS — D649 Anemia, unspecified: Secondary | ICD-10-CM | POA: Diagnosis not present

## 2023-03-30 DIAGNOSIS — L89626 Pressure-induced deep tissue damage of left heel: Secondary | ICD-10-CM | POA: Diagnosis not present

## 2023-03-30 DIAGNOSIS — E7800 Pure hypercholesterolemia, unspecified: Secondary | ICD-10-CM | POA: Diagnosis not present

## 2023-03-30 DIAGNOSIS — I10 Essential (primary) hypertension: Secondary | ICD-10-CM | POA: Diagnosis not present

## 2023-03-31 ENCOUNTER — Emergency Department (HOSPITAL_COMMUNITY)
Admission: EM | Admit: 2023-03-31 | Discharge: 2023-03-31 | Disposition: A | Discharge status: Home / Self Care | Payer: 59 | Attending: Emergency Medicine | Admitting: Emergency Medicine

## 2023-03-31 ENCOUNTER — Telehealth: Payer: Self-pay | Admitting: Cardiovascular Disease

## 2023-03-31 ENCOUNTER — Encounter (HOSPITAL_COMMUNITY): Payer: Self-pay

## 2023-03-31 ENCOUNTER — Emergency Department (HOSPITAL_COMMUNITY): Payer: 59

## 2023-03-31 ENCOUNTER — Other Ambulatory Visit: Payer: Self-pay

## 2023-03-31 DIAGNOSIS — E119 Type 2 diabetes mellitus without complications: Secondary | ICD-10-CM | POA: Diagnosis not present

## 2023-03-31 DIAGNOSIS — Z7982 Long term (current) use of aspirin: Secondary | ICD-10-CM | POA: Diagnosis not present

## 2023-03-31 DIAGNOSIS — I499 Cardiac arrhythmia, unspecified: Secondary | ICD-10-CM | POA: Diagnosis not present

## 2023-03-31 DIAGNOSIS — Z743 Need for continuous supervision: Secondary | ICD-10-CM | POA: Diagnosis not present

## 2023-03-31 DIAGNOSIS — I491 Atrial premature depolarization: Secondary | ICD-10-CM | POA: Diagnosis not present

## 2023-03-31 DIAGNOSIS — I10 Essential (primary) hypertension: Secondary | ICD-10-CM

## 2023-03-31 DIAGNOSIS — R42 Dizziness and giddiness: Secondary | ICD-10-CM | POA: Diagnosis not present

## 2023-03-31 DIAGNOSIS — S0990XA Unspecified injury of head, initial encounter: Secondary | ICD-10-CM | POA: Diagnosis not present

## 2023-03-31 DIAGNOSIS — Z09 Encounter for follow-up examination after completed treatment for conditions other than malignant neoplasm: Secondary | ICD-10-CM | POA: Diagnosis not present

## 2023-03-31 DIAGNOSIS — I739 Peripheral vascular disease, unspecified: Secondary | ICD-10-CM | POA: Diagnosis not present

## 2023-03-31 DIAGNOSIS — M1990 Unspecified osteoarthritis, unspecified site: Secondary | ICD-10-CM | POA: Diagnosis not present

## 2023-03-31 DIAGNOSIS — E1122 Type 2 diabetes mellitus with diabetic chronic kidney disease: Secondary | ICD-10-CM | POA: Diagnosis not present

## 2023-03-31 DIAGNOSIS — R404 Transient alteration of awareness: Secondary | ICD-10-CM | POA: Diagnosis not present

## 2023-03-31 DIAGNOSIS — R6889 Other general symptoms and signs: Secondary | ICD-10-CM | POA: Diagnosis not present

## 2023-03-31 DIAGNOSIS — L84 Corns and callosities: Secondary | ICD-10-CM | POA: Diagnosis not present

## 2023-03-31 DIAGNOSIS — Z79899 Other long term (current) drug therapy: Secondary | ICD-10-CM | POA: Insufficient documentation

## 2023-03-31 LAB — I-STAT CHEM 8, ED
BUN: 10 mg/dL (ref 8–23)
Calcium, Ion: 0.87 mmol/L — CL (ref 1.15–1.40)
Chloride: 112 mmol/L — ABNORMAL HIGH (ref 98–111)
Creatinine, Ser: 1.1 mg/dL — ABNORMAL HIGH (ref 0.44–1.00)
Glucose, Bld: 96 mg/dL (ref 70–99)
HCT: 40 % (ref 36.0–46.0)
Hemoglobin: 13.6 g/dL (ref 12.0–15.0)
Potassium: 4 mmol/L (ref 3.5–5.1)
Sodium: 138 mmol/L (ref 135–145)
TCO2: 22 mmol/L (ref 22–32)

## 2023-03-31 LAB — CBC WITH DIFFERENTIAL/PLATELET
Abs Immature Granulocytes: 0.02 10*3/uL (ref 0.00–0.07)
Basophils Absolute: 0 10*3/uL (ref 0.0–0.1)
Basophils Relative: 0 %
Eosinophils Absolute: 0.1 10*3/uL (ref 0.0–0.5)
Eosinophils Relative: 3 %
HCT: 38.3 % (ref 36.0–46.0)
Hemoglobin: 12.3 g/dL (ref 12.0–15.0)
Immature Granulocytes: 0 %
Lymphocytes Relative: 34 %
Lymphs Abs: 1.8 10*3/uL (ref 0.7–4.0)
MCH: 27.3 pg (ref 26.0–34.0)
MCHC: 32.1 g/dL (ref 30.0–36.0)
MCV: 84.9 fL (ref 80.0–100.0)
Monocytes Absolute: 0.7 10*3/uL (ref 0.1–1.0)
Monocytes Relative: 14 %
Neutro Abs: 2.5 10*3/uL (ref 1.7–7.7)
Neutrophils Relative %: 49 %
Platelets: 131 10*3/uL — ABNORMAL LOW (ref 150–400)
RBC: 4.51 MIL/uL (ref 3.87–5.11)
RDW: 17.9 % — ABNORMAL HIGH (ref 11.5–15.5)
WBC: 5.2 10*3/uL (ref 4.0–10.5)
nRBC: 0 % (ref 0.0–0.2)

## 2023-03-31 LAB — TROPONIN I (HIGH SENSITIVITY)
Troponin I (High Sensitivity): 25 ng/L — ABNORMAL HIGH (ref <18)
Troponin I (High Sensitivity): 23 ng/L — ABNORMAL HIGH (ref <18)

## 2023-03-31 LAB — CBG MONITORING, ED: Glucose-Capillary: 94 mg/dL (ref 70–99)

## 2023-03-31 MED ORDER — MECLIZINE HCL 25 MG PO TABS
25.0000 mg | ORAL_TABLET | Freq: Once | ORAL | Status: AC
  Administered 2023-03-31: 25 mg via ORAL
  Filled 2023-03-31: qty 1

## 2023-03-31 MED ORDER — CALCIUM CARBONATE ANTACID 500 MG PO CHEW
1.0000 | CHEWABLE_TABLET | Freq: Once | ORAL | Status: AC
  Administered 2023-03-31: 200 mg via ORAL
  Filled 2023-03-31: qty 1

## 2023-03-31 MED ORDER — MECLIZINE HCL 12.5 MG PO TABS: 12.5000 mg | ORAL_TABLET | Freq: Three times a day (TID) | ORAL | 0 refills | Status: DC | PRN

## 2023-03-31 NOTE — Telephone Encounter (Signed)
Daughter says appointment with family doctor went ok.  They looked at her feet and her family doctor wants her to go to a facility as she needs 24 hour care. Daughter does not want to put her in a facility. The family doctor does not know where the dizziness is coming from. Pt's HR continues to run low but daughter is unable to give me a number of what the "low" is. Daughter would like Dr. Hazle Coca input on what to do next. She is at a loss of what is causing these spells. Please advise.

## 2023-03-31 NOTE — Telephone Encounter (Signed)
Pt daughter calling back to give update.

## 2023-03-31 NOTE — ED Provider Notes (Signed)
[place] EMERGENCY DEPARTMENT AT [facility] Provider Note   CSN: [id number] Arrival date & time: 03/31/23  0209     History  Chief Complaint  Patient presents with   Dizziness    [name] is a 89 y.o. female.  The history is provided by the patient and a relative.  Dizziness Quality:  Head spinning Severity:  Severe Onset quality:  Sudden Timing:  Constant Progression:  Partially resolved Chronicity:  New Context: head movement   Relieved by:  Nothing Worsened by:  Nothing Ineffective treatments:  None tried Associated symptoms: no blood in stool, no chest pain, no headaches, no hearing loss, no nausea, no shortness of breath, no syncope, no tinnitus, no vision changes and no vomiting   Risk factors: no Meniere's disease       Past Medical History:  Diagnosis Date   Acid reflux    Arthritis    Dementia (HCC)    Diabetes mellitus without complication (HCC)    High cholesterol    Hypertension      Home Medications Prior to Admission medications   Medication Sig Start Date End Date Taking? Authorizing Provider  meclizine (ANTIVERT) 12.5 MG tablet Take 1 tablet (12.5 mg total) by mouth 3 (three) times daily as needed for dizziness. 03/31/23  Yes Arland Usery, MD  aspirin EC 81 MG tablet Take 1 tablet (81 mg total) by mouth daily. Swallow whole. 03/24/23   Lanae Boast, MD  atorvastatin (LIPITOR) 20 MG tablet Take 1 tablet (20 mg total) by mouth daily. 03/24/23 04/23/23  Lanae Boast, MD  metoprolol succinate (TOPROL-XL) 25 MG 24 hr tablet Take 0.5 tablets (12.5 mg total) by mouth daily. 03/24/23 04/23/23  Lanae Boast, MD  Propylene Glycol (SYSTANE BALANCE OP) Place 1 drop into both eyes daily as needed (for dry eye).    [provider]      Allergies    Patient has no known allergies.    Review of Systems   Review of Systems  Constitutional:  Negative for fever.  HENT:  Negative for hearing loss and tinnitus.   Respiratory:  Negative for  shortness of breath.   Cardiovascular:  Negative for chest pain and syncope.  Gastrointestinal:  Negative for blood in stool, nausea and vomiting.  Neurological:  Positive for dizziness. Negative for facial asymmetry and headaches.  All other systems reviewed and are negative.   Physical Exam Updated Vital Signs BP (!) 153/68   Pulse (!) 58   Temp 98 F (36.7 C)   Resp 20   SpO2 93%  Physical Exam Vitals and nursing note reviewed.  Constitutional:      General: She is not in acute distress.    Appearance: Normal appearance. She is well-developed.  HENT:     Head: Normocephalic and atraumatic.     Right Ear: Tympanic membrane normal.     Left Ear: Tympanic membrane normal.     Nose: No congestion.     Mouth/Throat:     Mouth: Mucous membranes are moist.     Pharynx: Oropharynx is clear.  Eyes:     Extraocular Movements: Extraocular movements intact.     Pupils: Pupils are equal, round, and reactive to light.  Cardiovascular:     Rate and Rhythm: Normal rate and regular rhythm.     Pulses: Normal pulses.     Heart sounds: Normal heart sounds.  Pulmonary:     Effort: Pulmonary effort is normal. No respiratory distress.  Breath sounds: Normal breath sounds.  Abdominal:     General: Bowel sounds are normal. There is no distension.     Palpations: Abdomen is soft.     Tenderness: There is no abdominal tenderness. There is no guarding or rebound.  Genitourinary:    Vagina: No vaginal discharge.  Musculoskeletal:        General: Normal range of motion.     Cervical back: Normal range of motion and neck supple.  Skin:    General: Skin is warm and dry.     Capillary Refill: Capillary refill takes less than 2 seconds.     Findings: No erythema or rash.  Neurological:     General: No focal deficit present.     Mental Status: She is alert.     Cranial Nerves: No cranial nerve deficit.     Deep Tendon Reflexes: Reflexes normal.  Psychiatric:        Mood and Affect: Mood  normal.     ED Results / Procedures / Treatments   Labs (all labs ordered are listed, but only abnormal results are displayed) Results for orders placed or performed during the hospital encounter of 03/31/23  CBC with Differential  Result Value Ref Range   WBC 5.2 4.0 - 10.5 K/uL   RBC 4.51 3.87 - 5.11 MIL/uL   Hemoglobin 12.3 12.0 - 15.0 g/dL   HCT 86.5 78.4 - 69.6 %   MCV 84.9 80.0 - 100.0 fL   MCH 27.3 26.0 - 34.0 pg   MCHC 32.1 30.0 - 36.0 g/dL   RDW 29.5 (H) 28.4 - 13.2 %   Platelets 131 (L) 150 - 400 K/uL   nRBC 0.0 0.0 - 0.2 %   Neutrophils Relative % 49 %   Neutro Abs 2.5 1.7 - 7.7 K/uL   Lymphocytes Relative 34 %   Lymphs Abs 1.8 0.7 - 4.0 K/uL   Monocytes Relative 14 %   Monocytes Absolute 0.7 0.1 - 1.0 K/uL   Eosinophils Relative 3 %   Eosinophils Absolute 0.1 0.0 - 0.5 K/uL   Basophils Relative 0 %   Basophils Absolute 0.0 0.0 - 0.1 K/uL   Immature Granulocytes 0 %   Abs Immature Granulocytes 0.02 0.00 - 0.07 K/uL  CBG monitoring, ED  Result Value Ref Range   Glucose-Capillary 94 70 - 99 mg/dL  I-stat chem 8, ED (not at [facility], [facility] or [facility])  Result Value Ref Range   Sodium 138 135 - 145 mmol/L   Potassium 4.0 3.5 - 5.1 mmol/L   Chloride 112 (H) 98 - 111 mmol/L   BUN 10 8 - 23 mg/dL   Creatinine, Ser 4.40 (H) 0.44 - 1.00 mg/dL   Glucose, Bld 96 70 - 99 mg/dL   Calcium, Ion 1.02 (LL) 1.15 - 1.40 mmol/L   TCO2 22 22 - 32 mmol/L   Hemoglobin 13.6 12.0 - 15.0 g/dL   HCT 72.5 36.6 - 44.0 %   Comment NOTIFIED PHYSICIAN   Troponin I (High Sensitivity)  Result Value Ref Range   Troponin I (High Sensitivity) 25 (H) <18 ng/L  Troponin I (High Sensitivity)  Result Value Ref Range   Troponin I (High Sensitivity) 23 (H) <18 ng/L   CT Head Wo Contrast  Result Date: 03/31/2023 CLINICAL DATA:  Trauma EXAM: CT HEAD WITHOUT CONTRAST TECHNIQUE: Contiguous axial images were obtained from the base of the skull through the vertex without intravenous contrast. RADIATION DOSE  REDUCTION: This exam was performed according to the departmental  dose-optimization program which includes automated exposure control, adjustment of the mA and/or kV according to patient size and/or use of iterative reconstruction technique. COMPARISON:  03/21/2023 FINDINGS: Brain: There is no mass, hemorrhage or extra-axial collection. There is generalized atrophy without lobar predilection. Hypodensity of the white matter is most commonly associated with chronic microvascular disease. Vascular: No abnormal hyperdensity of the major intracranial arteries or dural venous sinuses. No intracranial atherosclerosis. Skull: The visualized skull base, calvarium and extracranial soft tissues are normal. Sinuses/Orbits: No fluid levels or advanced mucosal thickening of the visualized paranasal sinuses. No mastoid or middle ear effusion. The orbits are normal. IMPRESSION: 1. No acute intracranial abnormality. 2. Generalized atrophy and findings of chronic microvascular disease. Electronically Signed   By: Deatra Robinson M.D.   On: 03/31/2023 04:00   CT CORONARY MORPH W/CTA COR W/SCORE W/CA W/CM &/OR WO/CM  Addendum Date: 03/30/2023   ADDENDUM REPORT: 03/30/2023 15:00 EXAM: OVER-READ INTERPRETATION  CT CHEST The following report is an over-read performed by radiologist Dr. [facility] on 03/30/2023. This over-read does not include interpretation of cardiac or coronary anatomy or pathology. The coronary CTA interpretation by the cardiologist is attached. COMPARISON:  03/22/2023. FINDINGS: Cardiovascular:  Findings discussed in the body of the report. Mediastinum/Nodes: No suspicious adenopathy identified. Imaged mediastinal structures are unremarkable. Lungs/Pleura: There is dependent basilar subsegmental atelectasis. No pneumonia or pulmonary edema. Lingular nodule measures 1.2 cm. Consider short interval follow up CT in 3 months, PET scan or tissue sampling. No pneumothorax. There is trace  bilateral pleural effusion. Upper Abdomen: No acute abnormality. Musculoskeletal: No chest wall abnormality. There are thoracic degenerative changes. IMPRESSION: 1. Lingular 12 mm nodule. Follow up with CT in 3 months, biopsy or PET scan recommended. 2. Bibasilar subsegmental atelectasis. 3. Trace pleural effusions. 4. Thoracic degenerative changes. Electronically Signed   By: Layla Maw M.D.   On: 03/30/2023 15:00   Result Date: 03/30/2023 CLINICAL DATA:  41F with abnormal EKG EXAM: Cardiac/Coronary CTA TECHNIQUE: The patient was scanned on a [employer]. FINDINGS: A 100 kV prospective scan was triggered in the descending thoracic aorta at 111 HU's. Axial non-contrast 3 mm slices were carried out through the heart. The data set was analyzed on a dedicated work station and scored using the Agatson method. Gantry rotation speed was 250 msecs and collimation was .6 mm. No beta blockade and 0.8 mg of sl NTG was given. The 3D data set was reconstructed in 5% intervals of the 35-75% of the R-R cycle. Phases were analyzed on a dedicated work station using MPR, MIP and VRT modes. The patient received 100 cc of contrast. Coronary Arteries:  Normal coronary origin.  Right dominance. RCA is a large dominant artery that gives rise to PDA and PLA. Calcified plaque in proximal RCA causes 25-49% stenosis. Calcified plaque in mid RCA causes 50-69% stenosis. Calcified plaque in distal RCA causes 25-49% stenosis. Left main is a large artery that gives rise to LAD and LCX arteries. LAD is a large vessel. Calcified plaque in proximal LAD causes 25-49% stenosis. Calcified plaque in mid LAD causes 25-49% stenosis. Calcified plaque in distal LAD causes 25-49% stenosis. Calcified plaque in large diagonal branch causes 50-69% stenosis LCX is a non-dominant artery that gives rise to one large OM1 branch. Calcified plaque in proximal LCX causes 25-49% stenosis. Calcified plaque in mid LCX causes 25-49% stenosis. Calcified  plaque in distal LCX causes 25-49% stenosis. Other findings: Left Ventricle: Normal size Left Atrium: Normal size Pulmonary  Veins: Normal configuration Right Ventricle: Normal size Right Atrium: Normal size Thoracic aorta: Normal size Pulmonary Arteries: Dilated main PA measuring 31mm Systemic Veins: Normal drainage Pericardium: Normal thickness IMPRESSION: 1. Poor quality study. Significant step artifacts due to respiratory motion during image acquisition, along with extensive coronary calcifications affects interpretability of study 2. Coronary calcium score of 3341. Percentile not available for age over 42 3. Total plaque volume 2250mm3 which is 99th percentile for age and sex-matched controls (calcified plaque 496mm3; noncalcified plaque 1834mm3). TPV is extensive 4.  Normal coronary origin with right dominance. 5. Moderate (50-69%) stenosis in mid RCA and large diagonal branch. Will send study for CTFFR 6.  Dilated main pulmonary artery measuring 31mm CAD-RADS 3. Moderate stenosis. Consider symptom-guided anti-ischemic pharmacotherapy as well as risk factor modification per guideline directed care. Additional analysis with CT FFR will be submitted. Electronically Signed: By: Epifanio Lesches M.D. On: 03/23/2023 16:31   CT CORONARY FFR DATA PREP & FLUID ANALYSIS  Result Date: 03/23/2023 EXAM: FFRCT ANALYSIS FINDINGS: FFRct analysis was performed on the original cardiac CT angiogram dataset. Diagrammatic representation of the FFRct analysis is provided in a separate PDF document in PACS. This dictation was created using the PDF document and an interactive 3D model of the results. 3D model is not available in the EMR/PACS. Normal FFR range is >0.80. 1. Left Main: No significant stenosis 2. LAD: CTFFR 0.97 across lesion in proximal LAD. CTFFR 0.91 across lesion in diagonal branch 3. LCX: CTFFR 0.99 across lesion in proximal LCX, falls to 0.88 across lesion in mid to distal LCX 4. RCA: CTFFR 0.96 across  lesion in proximal RCA, falls to 0.73 across lesion in mid to distal RCA IMPRESSION: 1.  CTFFR suggests severe stenosis in mid to distal RCA Electronically Signed   By: Epifanio Lesches M.D.   On: 03/23/2023 16:44   CT Angio Chest Pulmonary Embolism (PE) W or WO Contrast  Result Date: 03/22/2023 CLINICAL DATA:  Abnormal EKG.  Dizziness. EXAM: CT ANGIOGRAPHY CHEST WITH CONTRAST TECHNIQUE: Multidetector CT imaging of the chest was performed using the standard protocol during bolus administration of intravenous contrast. Multiplanar CT image reconstructions and MIPs were obtained to evaluate the vascular anatomy. RADIATION DOSE REDUCTION: This exam was performed according to the departmental dose-optimization program which includes automated exposure control, adjustment of the mA and/or kV according to patient size and/or use of iterative reconstruction technique. CONTRAST:  65mL OMNIPAQUE IOHEXOL 350 MG/ML SOLN COMPARISON:  CT chest abdomen and pelvis 09/01/2009 FINDINGS: Cardiovascular: Satisfactory opacification of the pulmonary arteries to the segmental level. No evidence of pulmonary embolism. Normal heart size. No pericardial effusion. There are atherosclerotic calcifications of the aorta. Mediastinum/Nodes: No enlarged mediastinal, hilar, or axillary lymph nodes. Thyroid gland, trachea, and esophagus demonstrate no significant findings. Lungs/Pleura: There is a rounded 1 cm nodule in the lingula. Lungs are otherwise clear. No pleural effusion or pneumothorax. Upper Abdomen: No acute abnormality. Cholecystectomy clips are present. There is a 3 cm left renal cyst. Musculoskeletal: Multilevel degenerative changes affect the spine. Review of the MIP images confirms the above findings. IMPRESSION: 1. No evidence for pulmonary embolism. 2. Left solid pulmonary nodule measuring 10 mm. Per Fleischner Society Guidelines, consider a non-contrast Chest CT at 3 months, a PET/CT, or tissue sampling. These guidelines  do not apply to immunocompromised patients and patients with cancer. Follow up in patients with significant comorbidities as clinically warranted. For lung cancer screening, adhere to Lung-RADS guidelines. Reference: Radiology. 2017; 284(1):228-43. 3. Bosniak I benign  renal cyst measuring 3 cm. No follow-up imaging is recommended. JACR 2018 Feb; 264-273, Management of the Incidental Renal Mass on CT, [employer] 2021; 814-848, Bosniak Classification of Cystic Renal Masses, Version 2019. Aortic Atherosclerosis (ICD10-I70.0). Electronically Signed   By: Darliss Cheney M.D.   On: 03/22/2023 20:42   ECHOCARDIOGRAM COMPLETE  Result Date: 03/22/2023    ECHOCARDIOGRAM REPORT   Patient Name:   KAYLEE PREVATT Date of Exam: 03/22/2023 Medical Rec #:  [id number]       Height:       67.0 in Accession #:    [id number]      Weight:       136.0 lb Date of Birth:  1930/08/26       BSA:          1.717 m Patient Age:    89 years        BP:           113/54 mmHg Patient Gender: F               HR:           68 bpm. Exam Location:  Inpatient Procedure: 2D Echo, Color Doppler and Cardiac Doppler Indications:    R94.31 Abnormal EKG  History:        Patient has no prior history of Echocardiogram examinations.                 Risk Factors:Hypertension, Diabetes and Dyslipidemia.  Sonographer:    Irving Burton Senior RDCS Referring Phys: [id number] VASUNDHRA RATHORE IMPRESSIONS  1. Left ventricular ejection fraction, by estimation, is 70 to 75%. Left ventricular ejection fraction by PLAX is 73 %. The left ventricle has hyperdynamic function. The left ventricle has no regional wall motion abnormalities. Left ventricular diastolic parameters are consistent with Grade I diastolic dysfunction (impaired relaxation). Elevated left atrial pressure.  2. Right ventricular systolic function is normal. The right ventricular size is normal. Tricuspid regurgitation signal is inadequate for assessing PA pressure.  3. The mitral valve is degenerative. Trivial  mitral valve regurgitation. No evidence of mitral stenosis. The mean mitral valve gradient is 3.5 mmHg.  4. The aortic valve is tricuspid. Aortic valve regurgitation is trivial. Aortic valve sclerosis is present, with no evidence of aortic valve stenosis.  5. The inferior vena cava is normal in size with <50% respiratory variability, suggesting right atrial pressure of 8 mmHg. Comparison(s): No prior Echocardiogram. FINDINGS  Left Ventricle: Left ventricular ejection fraction, by estimation, is 70 to 75%. Left ventricular ejection fraction by PLAX is 73 %. The left ventricle has hyperdynamic function. The left ventricle has no regional wall motion abnormalities. The left ventricular internal cavity size was normal in size. There is no left ventricular hypertrophy. Left ventricular diastolic parameters are consistent with Grade I diastolic dysfunction (impaired relaxation). Elevated left atrial pressure. Right Ventricle: The right ventricular size is normal. Right ventricular systolic function is normal. Tricuspid regurgitation signal is inadequate for assessing PA pressure. Left Atrium: Left atrial size was normal in size. Right Atrium: Right atrial size was normal in size. Pericardium: There is no evidence of pericardial effusion. Mitral Valve: The mitral valve is degenerative in appearance. There is moderate calcification of the mitral valve leaflet(s). Mild mitral annular calcification. Trivial mitral valve regurgitation. No evidence of mitral valve stenosis. MV peak gradient, 7.2 mmHg. The mean mitral valve gradient is 3.5 mmHg. Tricuspid Valve: The tricuspid valve is normal in structure. Tricuspid valve regurgitation is trivial. No evidence of tricuspid stenosis.  Aortic Valve: The aortic valve is tricuspid. Aortic valve regurgitation is trivial. Aortic valve sclerosis is present, with no evidence of aortic valve stenosis. Aortic valve mean gradient measures 4.0 mmHg. Aortic valve peak gradient measures 9.1  mmHg. Aortic valve area, by VTI measures 3.45 cm. Pulmonic Valve: The pulmonic valve was normal in structure. Pulmonic valve regurgitation is trivial. No evidence of pulmonic stenosis. Aorta: The aortic root and ascending aorta are structurally normal, with no evidence of dilitation. Venous: The inferior vena cava is normal in size with less than 50% respiratory variability, suggesting right atrial pressure of 8 mmHg. IAS/Shunts: No atrial level shunt detected by color flow Doppler.  LEFT VENTRICLE PLAX 2D LV EF:         Left            Diastology                ventricular     LV e' medial:    6.20 cm/s                ejection        LV E/e' medial:  15.3                fraction by     LV e' lateral:   7.62 cm/s                PLAX is 73      LV E/e' lateral: 12.4                %. LVIDd:         4.50 cm LVIDs:         2.60 cm LV PW:         0.90 cm LV IVS:        0.90 cm LVOT diam:     2.10 cm LV SV:         95 LV SV Index:   55 LVOT Area:     3.46 cm  RIGHT VENTRICLE RV S prime:     14.00 cm/s TAPSE (M-mode): 2.4 cm LEFT ATRIUM             Index        RIGHT ATRIUM           Index LA diam:        4.50 cm 2.62 cm/m   RA Area:     13.70 cm LA Vol (A2C):   56.6 ml 32.97 ml/m  RA Volume:   26.40 ml  15.38 ml/m LA Vol (A4C):   60.6 ml 35.30 ml/m LA Biplane Vol: 58.9 ml 34.31 ml/m  AORTIC VALVE AV Area (Vmax):    2.91 cm AV Area (Vmean):   3.12 cm AV Area (VTI):     3.45 cm AV Vmax:           151.00 cm/s AV Vmean:          90.200 cm/s AV VTI:            0.275 m AV Peak Grad:      9.1 mmHg AV Mean Grad:      4.0 mmHg LVOT Vmax:         127.00 cm/s LVOT Vmean:        81.200 cm/s LVOT VTI:          0.274 m LVOT/AV VTI ratio: 1.00  AORTA Ao Root diam: 2.90 cm Ao Asc diam:  3.20 cm MITRAL VALVE MV  Area (PHT): 1.80 cm     SHUNTS MV Area VTI:   2.59 cm     Systemic VTI:  0.27 m MV Peak grad:  7.2 mmHg     Systemic Diam: 2.10 cm MV Mean grad:  3.5 mmHg MV Vmax:       1.34 m/s MV Vmean:      90.5 cm/s MV Decel  Time: 422 msec MV E velocity: 94.70 cm/s MV A velocity: 138.00 cm/s MV E/A ratio:  0.69 Olga Millers MD Electronically signed by Olga Millers MD Signature Date/Time: 03/22/2023/9:59:11 AM    Final    MR BRAIN WO CONTRAST  Result Date: 03/21/2023 CLINICAL DATA:  Initial evaluation for syncope/presyncope, stroke suspected. EXAM: MRI HEAD WITHOUT CONTRAST TECHNIQUE: Multiplanar, multiecho pulse sequences of the brain and surrounding structures were obtained without intravenous contrast. COMPARISON:  Prior CT from earlier the same day. FINDINGS: Brain: Diffuse prominence of the CSF containing spaces compatible generalized age-related cerebral atrophy. Patchy T2/FLAIR hyperintensity involving the periventricular and deep white matter, consistent with chronic small vessel ischemic disease, mild for age. No evidence for acute or subacute ischemia. Gray-white matter differentiation maintained. No areas of chronic cortical infarction. No acute intracranial hemorrhage. Few punctate chronic micro hemorrhages noted within the right cerebral hemisphere, of doubtful significance. No mass lesion, midline shift or mass effect. No hydrocephalus or extra-axial fluid collection. Pituitary gland suprasellar region within normal limits. Vascular: Major intracranial vascular flow voids are maintained. Skull and upper cervical spine: Junction with normal degenerative thickening with pannus formation noted about the dens and tectorial membrane. No high-grade spinal stenosis about the cervicomedullary junction. Bone marrow signal intensity within normal limits. No scalp soft tissue abnormality. Sinuses/Orbits: Prior bilateral ocular lens replacement. Paranasal sinuses are clear. No significant mastoid effusion. Other: None. IMPRESSION: 1. No acute intracranial abnormality. 2. Age-related cerebral atrophy with mild chronic small vessel ischemic disease. Electronically Signed   By: Rise Mu M.D.   On: 03/21/2023 20:53    DG Chest Portable 1 View  Result Date: 03/21/2023 CLINICAL DATA:  Dizziness EXAM: PORTABLE CHEST 1 VIEW COMPARISON:  None Available. FINDINGS: Lungs are well expanded, symmetric, and clear. No pneumothorax or pleural effusion. Cardiac size within normal limits. Pulmonary vascularity is normal. Osseous structures are age-appropriate. No acute bone abnormality. IMPRESSION: No active disease. Electronically Signed   By: Helyn Numbers M.D.   On: 03/21/2023 19:41   CT Head Wo Contrast  Result Date: 03/21/2023 CLINICAL DATA:  Headaches EXAM: CT HEAD WITHOUT CONTRAST TECHNIQUE: Contiguous axial images were obtained from the base of the skull through the vertex without intravenous contrast. RADIATION DOSE REDUCTION: This exam was performed according to the departmental dose-optimization program which includes automated exposure control, adjustment of the mA and/or kV according to patient size and/or use of iterative reconstruction technique. COMPARISON:  03/31/2021 FINDINGS: Brain: No evidence of acute infarction, hemorrhage, extra-axial collection, ventriculomegaly, or mass effect. Generalized cerebral atrophy. Periventricular white matter low attenuation likely secondary to microangiopathy. Vascular: Cerebrovascular atherosclerotic calcifications are noted. Skull: Negative for fracture or focal lesion. Mineralized pannus formation along the posterior aspect of the dens. Sinuses/Orbits: Visualized portions of the orbits are unremarkable. Visualized portions of the paranasal sinuses are unremarkable. Visualized portions of the mastoid air cells are unremarkable. Other: None. IMPRESSION: 1. No acute intracranial pathology. 2. Chronic microvascular disease and cerebral atrophy. Electronically Signed   By: Elige Ko M.D.   On: 03/21/2023 18:47   ] EKG  EKG Interpretation Date/Time:  Wednesday March 31 2023  02:12:34 EDT Ventricular Rate:  52 PR Interval:  139 QRS Duration:  88 QT Interval:  502 QTC  Calculation: 467 R Axis:   62  Text Interpretation: Sinus rhythm Repol abnrm, global ischemia, diffuse leads unchanged Confirmed by Nicanor Alcon, Elowyn Raupp ([id number]) on 03/31/2023 6:28:48 AM        Radiology CT Head Wo Contrast  Result Date: 03/31/2023 CLINICAL DATA:  Trauma EXAM: CT HEAD WITHOUT CONTRAST TECHNIQUE: Contiguous axial images were obtained from the base of the skull through the vertex without intravenous contrast. RADIATION DOSE REDUCTION: This exam was performed according to the departmental dose-optimization program which includes automated exposure control, adjustment of the mA and/or kV according to patient size and/or use of iterative reconstruction technique. COMPARISON:  03/21/2023 FINDINGS: Brain: There is no mass, hemorrhage or extra-axial collection. There is generalized atrophy without lobar predilection. Hypodensity of the white matter is most commonly associated with chronic microvascular disease. Vascular: No abnormal hyperdensity of the major intracranial arteries or dural venous sinuses. No intracranial atherosclerosis. Skull: The visualized skull base, calvarium and extracranial soft tissues are normal. Sinuses/Orbits: No fluid levels or advanced mucosal thickening of the visualized paranasal sinuses. No mastoid or middle ear effusion. The orbits are normal. IMPRESSION: 1. No acute intracranial abnormality. 2. Generalized atrophy and findings of chronic microvascular disease. Electronically Signed   By: Deatra Robinson M.D.   On: 03/31/2023 04:00    Procedures Procedures    Medications Ordered in ED Medications  meclizine (ANTIVERT) tablet 25 mg (25 mg Oral Given 03/31/23 0313)  calcium carbonate (TUMS - dosed in mg elemental calcium) chewable tablet 200 mg of elemental calcium (200 mg of elemental calcium Oral Given 03/31/23 0344)    ED Course/ Medical Decision Making/ A&P                                 Medical Decision Making Patient with sudden onset room spinning,  this happened and patient was recently admitted for same and had negative MRI, awaiting outpatient neuro appointment  Amount and/or Complexity of Data Reviewed Independent Historian: EMS    Details: Daughter and EMS see above  External Data Reviewed: labs, radiology and notes.    Details: Previous admission and MRI reviewed chronically elevated troponins  Labs: ordered.    Details: Normal white count 5.2, normal hemoglobin 12.3, low platelets 131. Normal sodium 138, normal potassium 4, elevated creatinine 1.10. first troponin slight elevation but lower than baseline 25, second troponin lower 23.   Radiology: ordered and independent interpretation performed. Decision-making details documented in ED Course.    Details: No acute finding on CT  Risk OTC drugs. Risk Details: Patient has vertigo.  Has had MRI for same that did not demonstrate acute abnormality. Symptoms are consistent with peripheral vertigo.   Will need close follow up with neurology.  Stable for discharge.  Strict return    Final Clinical Impression(s) / ED Diagnoses Final diagnoses:  Vertigo   Return for intractable cough, coughing up blood, fevers > 100.4 unrelieved by medication, shortness of breath, intractable vomiting, chest pain, shortness of breath, weakness, numbness, changes in speech, facial asymmetry, abdominal pain, passing out, Inability to tolerate liquids or food, cough, altered mental status or any concerns. No signs of systemic illness or infection. The patient is nontoxic-appearing on exam and vital signs are within normal limits.  I have reviewed the triage vital signs and the nursing notes. Pertinent labs & imaging results  that were available during my care of the patient were reviewed by me and considered in my medical decision making (see chart for details). After history, exam, and medical workup I feel the patient has been appropriately medically screened and is safe for discharge home. Pertinent diagnoses  were discussed with the patient. Patient was given return precautions.    Rx / DC Orders ED Discharge Orders          Ordered    meclizine (ANTIVERT) 12.5 MG tablet  3 times daily PRN        03/31/23 0608              Alayla Dethlefs, MD 03/31/23 9518

## 2023-03-31 NOTE — Telephone Encounter (Signed)
Returned call to pt. Daughter Magda Paganini. Pt has been having dizzy spells and her HR has been running low. Pt was in the hospital a week ago for 4 days and then she went to the ER again last night with these dizzy spells and low HR. Pt is currently at her family doctor and this nurse advised they call back once that appt is over then to give [place] a call back. Daughter verbalized understanding.

## 2023-03-31 NOTE — ED Triage Notes (Signed)
Patient live alone at home. 1 episode of the room spinning when she was laying turning in bed. She called her daughter who then called ems. Upon ems arrival patient had walked out to the truck to meet them with a cane and no assistance. Pertinent hx, recent visit for same thing. Recent cardiac med changes.

## 2023-03-31 NOTE — Telephone Encounter (Signed)
STAT if patient feels like he/she is going to faint   Are you dizzy now? no  Do you feel faint or have you passed out? yes  Do you have any other symptoms?  no  Have you checked your HR and BP (record if available)?    Daughter states that she is worried and think its something going on with her heart. Patient has a recent stay in the hospital. Please advise

## 2023-04-01 ENCOUNTER — Ambulatory Visit: Payer: 59 | Attending: Cardiovascular Disease

## 2023-04-01 DIAGNOSIS — D649 Anemia, unspecified: Secondary | ICD-10-CM | POA: Diagnosis not present

## 2023-04-01 DIAGNOSIS — I10 Essential (primary) hypertension: Secondary | ICD-10-CM | POA: Diagnosis not present

## 2023-04-01 DIAGNOSIS — K219 Gastro-esophageal reflux disease without esophagitis: Secondary | ICD-10-CM | POA: Diagnosis not present

## 2023-04-01 DIAGNOSIS — L89626 Pressure-induced deep tissue damage of left heel: Secondary | ICD-10-CM | POA: Diagnosis not present

## 2023-04-01 DIAGNOSIS — Z7982 Long term (current) use of aspirin: Secondary | ICD-10-CM | POA: Diagnosis not present

## 2023-04-01 DIAGNOSIS — R42 Dizziness and giddiness: Secondary | ICD-10-CM

## 2023-04-01 DIAGNOSIS — Z9181 History of falling: Secondary | ICD-10-CM | POA: Diagnosis not present

## 2023-04-01 DIAGNOSIS — L89616 Pressure-induced deep tissue damage of right heel: Secondary | ICD-10-CM | POA: Diagnosis not present

## 2023-04-01 DIAGNOSIS — E7800 Pure hypercholesterolemia, unspecified: Secondary | ICD-10-CM | POA: Diagnosis not present

## 2023-04-01 DIAGNOSIS — E1142 Type 2 diabetes mellitus with diabetic polyneuropathy: Secondary | ICD-10-CM | POA: Diagnosis not present

## 2023-04-01 DIAGNOSIS — E1151 Type 2 diabetes mellitus with diabetic peripheral angiopathy without gangrene: Secondary | ICD-10-CM | POA: Diagnosis not present

## 2023-04-01 NOTE — Progress Notes (Unsigned)
Enrolled patient for a 14 day Zio XT  monitor to be mailed to patients home  °

## 2023-04-01 NOTE — Telephone Encounter (Signed)
Called pt daughter Magda Paganini. Advised her of Dr. Hazle Coca recommendation below. Daughter verbalized understanding. Pt has an appt with Sherlean Foot in Sept.   Per Dr. Allyson Sabal-- I haven't seen pt in 2.5 years. Put a 2 week ZIO patch on her and have her come back to see an APP

## 2023-04-02 DIAGNOSIS — L89616 Pressure-induced deep tissue damage of right heel: Secondary | ICD-10-CM | POA: Diagnosis not present

## 2023-04-02 DIAGNOSIS — I10 Essential (primary) hypertension: Secondary | ICD-10-CM | POA: Diagnosis not present

## 2023-04-02 DIAGNOSIS — E7800 Pure hypercholesterolemia, unspecified: Secondary | ICD-10-CM | POA: Diagnosis not present

## 2023-04-02 DIAGNOSIS — E1151 Type 2 diabetes mellitus with diabetic peripheral angiopathy without gangrene: Secondary | ICD-10-CM | POA: Diagnosis not present

## 2023-04-02 DIAGNOSIS — L89626 Pressure-induced deep tissue damage of left heel: Secondary | ICD-10-CM | POA: Diagnosis not present

## 2023-04-02 DIAGNOSIS — E1142 Type 2 diabetes mellitus with diabetic polyneuropathy: Secondary | ICD-10-CM | POA: Diagnosis not present

## 2023-04-02 DIAGNOSIS — Z9181 History of falling: Secondary | ICD-10-CM | POA: Diagnosis not present

## 2023-04-02 DIAGNOSIS — Z7982 Long term (current) use of aspirin: Secondary | ICD-10-CM | POA: Diagnosis not present

## 2023-04-02 DIAGNOSIS — K219 Gastro-esophageal reflux disease without esophagitis: Secondary | ICD-10-CM | POA: Diagnosis not present

## 2023-04-02 DIAGNOSIS — D649 Anemia, unspecified: Secondary | ICD-10-CM | POA: Diagnosis not present

## 2023-04-02 NOTE — Progress Notes (Unsigned)
Cardiology Office Note:  .   Date:  04/07/2023  ID:  [name], DOB Jan 18, 1931, MRN [id number] PCP: Sierra Reichmann, DO  [facility] Providers Cardiologist:  Sierra Batty, MD    History of Present Illness: .   [name] is a 89 y.o. female with past medical history of hypertension, hyperlipidemia, PVD, GERD, dementia, type 2 diabetes.  She is followed by Dr. [name], presents today for follow-up regarding CAD and recent hospital admission.  Previously seen by Dr. [name] in 2021 and 2022 for a right heel wound.  He recommended aggressive of local wound care and offloading heel.  Pressures performed 07/29/2020 revealed noncompressible right ABI with a left of 0.98 and no significant obstructive disease.  She presented to the emergency room on 03/21/2023 with vertigo-like symptoms, unclear if related to underlying CAD.  Her troponins 34>>38.  Her EKG showed ST depression in inferior, anteroseptal and lateral leads.  Gated CTA on 8/20 showed severe stenosis in the mid to distal RCA.  She underwent echo on 03/22/2023 that showed LVEF 70 to 75%, no RWMA, G1 DD and normal RV.  Given her age and CKD stage III plan is to medically manage.  She was started on aspirin 81 mg daily, metoprolol 12.5 daily, Lipitor 20 mg daily and restarted on her benazepril 40 mg daily.   On 03/31/2023 patient's daughter notified the office of dizzy spells as well as low heart rate.  She had been seen in the emergency room for vertigo, was given prescription for meclizine and recommended close follow-up with neurology. Dr. [name] recommended placing a 2-week Zio patch.  Today [name] presents for follow-up with her daughter.  She reports that she has been doing very well overall.  She denies chest pain, shortness of breath or lower extremity edema.  She denies any further dizzy spells since her ED visit on 8/28.  She and her daughter have no questions or concerns today.  ROS: Today she denies chest pain,  shortness of breath, lower extremity edema, fatigue, palpitations, melena, hematuria, hemoptysis, diaphoresis, weakness, presyncope, syncope, orthopnea, and PND.   Studies Reviewed: Marland Kitchen   EKG Interpretation Date/Time:  Wednesday April 07 2023 10:25:24 EDT Ventricular Rate:  58 PR Interval:  142 QRS Duration:  78 QT Interval:  466 QTC Calculation: 457 R Axis:   32  Text Interpretation: Sinus bradycardia with with frequent Premature atrial complexes ST & Marked T wave abnormality, consider anterolateral ischemia unchanged from prior tracings Confirmed by Reather Littler [phone]) on 04/07/2023 10:41:26 AM   Cardiac Studies & Procedures       ECHOCARDIOGRAM  ECHOCARDIOGRAM COMPLETE 03/22/2023  Narrative ECHOCARDIOGRAM REPORT    Patient Name:   [name] Date of Exam: 03/22/2023 Medical Rec #:  [id number]       Height:       67.0 in Accession #:    [id number]      Weight:       136.0 lb Date of Birth:  July 12, 1931       BSA:          1.717 m Patient Age:    89 years        BP:           113/54 mmHg Patient Gender: F               HR:           68 bpm. Exam Location:  Inpatient  Procedure: 2D Echo, Color Doppler and Cardiac Doppler  Indications:    R94.31 Abnormal EKG  History:        Patient has no prior history of Echocardiogram examinations. Risk Factors:Hypertension, Diabetes and Dyslipidemia.  Sonographer:    Irving Burton Senior RDCS Referring Phys: [id number] [name]  IMPRESSIONS   1. Left ventricular ejection fraction, by estimation, is 70 to 75%. Left ventricular ejection fraction by PLAX is 73 %. The left ventricle has hyperdynamic function. The left ventricle has no regional wall motion abnormalities. Left ventricular diastolic parameters are consistent with Grade I diastolic dysfunction (impaired relaxation). Elevated left atrial pressure. 2. Right ventricular systolic function is normal. The right ventricular size is normal. Tricuspid regurgitation signal is  inadequate for assessing PA pressure. 3. The mitral valve is degenerative. Trivial mitral valve regurgitation. No evidence of mitral stenosis. The mean mitral valve gradient is 3.5 mmHg. 4. The aortic valve is tricuspid. Aortic valve regurgitation is trivial. Aortic valve sclerosis is present, with no evidence of aortic valve stenosis. 5. The inferior vena cava is normal in size with <50% respiratory variability, suggesting right atrial pressure of 8 mmHg.  Comparison(s): No prior Echocardiogram.  FINDINGS Left Ventricle: Left ventricular ejection fraction, by estimation, is 70 to 75%. Left ventricular ejection fraction by PLAX is 73 %. The left ventricle has hyperdynamic function. The left ventricle has no regional wall motion abnormalities. The left ventricular internal cavity size was normal in size. There is no left ventricular hypertrophy. Left ventricular diastolic parameters are consistent with Grade I diastolic dysfunction (impaired relaxation). Elevated left atrial pressure.  Right Ventricle: The right ventricular size is normal. Right ventricular systolic function is normal. Tricuspid regurgitation signal is inadequate for assessing PA pressure.  Left Atrium: Left atrial size was normal in size.  Right Atrium: Right atrial size was normal in size.  Pericardium: There is no evidence of pericardial effusion.  Mitral Valve: The mitral valve is degenerative in appearance. There is moderate calcification of the mitral valve leaflet(s). Mild mitral annular calcification. Trivial mitral valve regurgitation. No evidence of mitral valve stenosis. MV peak gradient, 7.2 mmHg. The mean mitral valve gradient is 3.5 mmHg.  Tricuspid Valve: The tricuspid valve is normal in structure. Tricuspid valve regurgitation is trivial. No evidence of tricuspid stenosis.  Aortic Valve: The aortic valve is tricuspid. Aortic valve regurgitation is trivial. Aortic valve sclerosis is present, with no evidence of  aortic valve stenosis. Aortic valve mean gradient measures 4.0 mmHg. Aortic valve peak gradient measures 9.1 mmHg. Aortic valve area, by VTI measures 3.45 cm.  Pulmonic Valve: The pulmonic valve was normal in structure. Pulmonic valve regurgitation is trivial. No evidence of pulmonic stenosis.  Aorta: The aortic root and ascending aorta are structurally normal, with no evidence of dilitation.  Venous: The inferior vena cava is normal in size with less than 50% respiratory variability, suggesting right atrial pressure of 8 mmHg.  IAS/Shunts: No atrial level shunt detected by color flow Doppler.   LEFT VENTRICLE PLAX 2D LV EF:         Left            Diastology ventricular     LV e' medial:    6.20 cm/s ejection        LV E/e' medial:  15.3 fraction by     LV e' lateral:   7.62 cm/s PLAX is 73      LV E/e' lateral: 12.4 %. LVIDd:         4.50 cm LVIDs:  2.60 cm LV PW:         0.90 cm LV IVS:        0.90 cm LVOT diam:     2.10 cm LV SV:         95 LV SV Index:   55 LVOT Area:     3.46 cm   RIGHT VENTRICLE RV S prime:     14.00 cm/s TAPSE (M-mode): 2.4 cm  LEFT ATRIUM             Index        RIGHT ATRIUM           Index LA diam:        4.50 cm 2.62 cm/m   RA Area:     13.70 cm LA Vol (A2C):   56.6 ml 32.97 ml/m  RA Volume:   26.40 ml  15.38 ml/m LA Vol (A4C):   60.6 ml 35.30 ml/m LA Biplane Vol: 58.9 ml 34.31 ml/m AORTIC VALVE AV Area (Vmax):    2.91 cm AV Area (Vmean):   3.12 cm AV Area (VTI):     3.45 cm AV Vmax:           151.00 cm/s AV Vmean:          90.200 cm/s AV VTI:            0.275 m AV Peak Grad:      9.1 mmHg AV Mean Grad:      4.0 mmHg LVOT Vmax:         127.00 cm/s LVOT Vmean:        81.200 cm/s LVOT VTI:          0.274 m LVOT/AV VTI ratio: 1.00  AORTA Ao Root diam: 2.90 cm Ao Asc diam:  3.20 cm  MITRAL VALVE MV Area (PHT): 1.80 cm     SHUNTS MV Area VTI:   2.59 cm     Systemic VTI:  0.27 m MV Peak grad:  7.2 mmHg     Systemic  Diam: 2.10 cm MV Mean grad:  3.5 mmHg MV Vmax:       1.34 m/s MV Vmean:      90.5 cm/s MV Decel Time: 422 msec MV E velocity: 94.70 cm/s MV A velocity: 138.00 cm/s MV E/A ratio:  0.69  Olga Millers MD Electronically signed by Olga Millers MD Signature Date/Time: 03/22/2023/9:59:11 AM    Final     CT SCANS  CT CORONARY MORPH W/CTA COR W/SCORE 03/23/2023  Addendum 03/30/2023  3:03 PM ADDENDUM REPORT: 03/30/2023 15:00  EXAM: OVER-READ INTERPRETATION  CT CHEST  The following report is an over-read performed by radiologist Dr. Curly Shores [facility] on 03/30/2023. This over-read does not include interpretation of cardiac or coronary anatomy or pathology. The coronary CTA interpretation by the cardiologist is attached.  COMPARISON:  03/22/2023.  FINDINGS: Cardiovascular:  Findings discussed in the body of the report.  Mediastinum/Nodes: No suspicious adenopathy identified. Imaged mediastinal structures are unremarkable.  Lungs/Pleura: There is dependent basilar subsegmental atelectasis. No pneumonia or pulmonary edema. Lingular nodule measures 1.2 cm. Consider short interval follow up CT in 3 months, PET scan or tissue sampling. No pneumothorax. There is trace bilateral pleural effusion.  Upper Abdomen: No acute abnormality.  Musculoskeletal: No chest wall abnormality. There are thoracic degenerative changes.  IMPRESSION: 1. Lingular 12 mm nodule. Follow up with CT in 3 months, biopsy or PET scan recommended. 2. Bibasilar subsegmental atelectasis. 3. Trace pleural effusions. 4.  Thoracic degenerative changes.   Electronically Signed By: Layla Maw M.D. On: 03/30/2023 15:00  Narrative CLINICAL DATA:  69F with abnormal EKG  EXAM: Cardiac/Coronary CTA  TECHNIQUE: The patient was scanned on a [employer].  FINDINGS: A 100 kV prospective scan was triggered in the descending thoracic aorta at 111 HU's. Axial  non-contrast 3 mm slices were carried out through the heart. The data set was analyzed on a dedicated work station and scored using the Agatson method. Gantry rotation speed was 250 msecs and collimation was .6 mm. No beta blockade and 0.8 mg of sl NTG was given. The 3D data set was reconstructed in 5% intervals of the 35-75% of the R-R cycle. Phases were analyzed on a dedicated work station using MPR, MIP and VRT modes. The patient received 100 cc of contrast.  Coronary Arteries:  Normal coronary origin.  Right dominance.  RCA is a large dominant artery that gives rise to PDA and PLA. Calcified plaque in proximal RCA causes 25-49% stenosis. Calcified plaque in mid RCA causes 50-69% stenosis. Calcified plaque in distal RCA causes 25-49% stenosis.  Left main is a large artery that gives rise to LAD and LCX arteries.  LAD is a large vessel. Calcified plaque in proximal LAD causes 25-49% stenosis. Calcified plaque in mid LAD causes 25-49% stenosis. Calcified plaque in distal LAD causes 25-49% stenosis. Calcified plaque in large diagonal branch causes 50-69% stenosis  LCX is a non-dominant artery that gives rise to one large OM1 branch. Calcified plaque in proximal LCX causes 25-49% stenosis. Calcified plaque in mid LCX causes 25-49% stenosis. Calcified plaque in distal LCX causes 25-49% stenosis.  Other findings:  Left Ventricle: Normal size  Left Atrium: Normal size  Pulmonary Veins: Normal configuration  Right Ventricle: Normal size  Right Atrium: Normal size  Thoracic aorta: Normal size  Pulmonary Arteries: Dilated main PA measuring 31mm  Systemic Veins: Normal drainage  Pericardium: Normal thickness  IMPRESSION: 1. Poor quality study. Significant step artifacts due to respiratory motion during image acquisition, along with extensive coronary calcifications affects interpretability of study  2. Coronary calcium score of 3341. Percentile not available for age over  19  3. Total plaque volume 2272mm3 which is 99th percentile for age and sex-matched controls (calcified plaque 452mm3; noncalcified plaque 1842mm3). TPV is extensive  4.  Normal coronary origin with right dominance.  5. Moderate (50-69%) stenosis in mid RCA and large diagonal branch. Will send study for CTFFR  6.  Dilated main pulmonary artery measuring 31mm  CAD-RADS 3. Moderate stenosis. Consider symptom-guided anti-ischemic pharmacotherapy as well as risk factor modification per guideline directed care. Additional analysis with CT FFR will be submitted.  Electronically Signed: By: Epifanio Lesches M.D. On: 03/23/2023 16:31                   Physical Exam:   VS:  BP 132/68   Pulse (!) 58   Ht 5\' 7"  (1.702 m)   Wt 132 lb 3.2 oz (60 kg)   SpO2 99%   BMI 20.71 kg/m    Wt Readings from Last 3 Encounters:  04/07/23 132 lb 3.2 oz (60 kg)  03/24/23 132 lb 12.8 oz (60.2 kg)  12/20/20 141 lb (64 kg)    GEN: Well nourished, well developed in no acute distress NECK: No JVD; No carotid bruits CARDIAC: RRR, no murmurs, rubs, gallops RESPIRATORY:  Clear to auscultation without rales, wheezing or rhonchi  ABDOMEN: Soft, non-tender, non-distended EXTREMITIES:  No edema;  No deformity   ASSESSMENT AND PLAN: .    CAD: Noted to have severe stenosis in the mid to distal RCA on CTA during admission on 03/23/2023, CTFFR 0.99 across lesion in proximal RCA, falls to 0.73 across lesion in mid to distal RCA. Trop 34>>38.  EKG showed ST depression inferior, anteroseptal and lateral leads.  Echo 03/22/2023 showed LVEF 70 to 75%, no RWMA, G1 DD, normal RV.  Plan to medically manage.  EKG today shows sinus rhythm with frequent PACs, diffuse twave inversion noted. She presents for follow-up, she denies any anginal symptoms, reports that she is feeling very well overall. Recommended heart health and low salt diet. Reviewed ED precautions. Continue aspirin 81 mg daily, metoprolol 12.5 daily,  Lipitor 20 mg daily.  HTN: Initial blood pressure today 146/84, on recheck was 132/68.  Her benazepril was discontinued in setting of AKI during admission.  Given her age and intermittent dizzy spells we will allow for some permissive hypertension.  Continue metoprolol 12.5 mg daily.  HLD: Last lipid profile on 03/24/2023 showed total cholesterol of 177, HDL 40 and LDL of 123.  She was started on Lipitor 20 mg daily in August.  Recheck fasting lipid profile and LFTs in 2 months.  Limb ischemia/PAD: Most recent ABI in 03/2022 indicates resting right ABI noncompressible right lower extremity arteries and left ABI indicates moderate left lower extremity arterial disease although it is noted arterial wall calcifications preclude accurate ankle pressures and ABIs.  Today she denies pain or claudication of lower extremities.  She does have calluses on the lateral aspect of the right foot.  She has no open wounds.  She has palpable DP/PT pulses +1.  Discussed repeating ABIs, she would prefer to follow-up with Dr. [name] prior to repeat ABI.   QT prolongation: QT during admission noted to be 540 ms. EKG today shows QT 457 ms. Avoid QT prolonging agents.        Dispo: Follow up with Dr. [name] in December as scheduled.   Signed, Rip Harbour, NP

## 2023-04-07 ENCOUNTER — Ambulatory Visit: Payer: 59 | Attending: Physician Assistant | Admitting: Cardiology

## 2023-04-07 ENCOUNTER — Telehealth: Payer: Self-pay

## 2023-04-07 ENCOUNTER — Ambulatory Visit: Payer: 59 | Admitting: Physician Assistant

## 2023-04-07 ENCOUNTER — Encounter: Payer: Self-pay | Admitting: General Practice

## 2023-04-07 VITALS — BP 132/68 | HR 58 | Ht 67.0 in | Wt 132.2 lb

## 2023-04-07 DIAGNOSIS — I739 Peripheral vascular disease, unspecified: Secondary | ICD-10-CM

## 2023-04-07 DIAGNOSIS — I10 Essential (primary) hypertension: Secondary | ICD-10-CM | POA: Diagnosis not present

## 2023-04-07 DIAGNOSIS — E785 Hyperlipidemia, unspecified: Secondary | ICD-10-CM

## 2023-04-07 DIAGNOSIS — I2510 Atherosclerotic heart disease of native coronary artery without angina pectoris: Secondary | ICD-10-CM | POA: Diagnosis not present

## 2023-04-07 DIAGNOSIS — R42 Dizziness and giddiness: Secondary | ICD-10-CM | POA: Diagnosis not present

## 2023-04-07 DIAGNOSIS — R9431 Abnormal electrocardiogram [ECG] [EKG]: Secondary | ICD-10-CM

## 2023-04-07 DIAGNOSIS — R931 Abnormal findings on diagnostic imaging of heart and coronary circulation: Secondary | ICD-10-CM | POA: Diagnosis not present

## 2023-04-07 MED ORDER — ATORVASTATIN CALCIUM 20 MG PO TABS: 20.0000 mg | ORAL_TABLET | Freq: Every day | ORAL | 3 refills | Status: DC

## 2023-04-07 MED ORDER — METOPROLOL SUCCINATE ER 25 MG PO TB24: 12.5000 mg | ORAL_TABLET | Freq: Every day | ORAL | 3 refills | Status: AC

## 2023-04-07 MED ORDER — ASPIRIN 81 MG PO TBEC: 81.0000 mg | DELAYED_RELEASE_TABLET | Freq: Every day | ORAL | 3 refills | Status: DC

## 2023-04-07 NOTE — Telephone Encounter (Signed)
Updated our lab since I was unable to contact pt. She will see if pt comes in. She will mail if pt does not come in by Friday.

## 2023-04-07 NOTE — Patient Instructions (Addendum)
Medication Instructions:  The current medical regimen is effective;  continue present plan and medications as directed. Please refer to the Current Medication list given to you today.  *If you need a refill on your cardiac medications before your next appointment, please call your pharmacy*  Lab Work: BMET IN 2 months If you have labs (blood work) drawn today and your tests are completely normal, you will receive your results only by:  MyChart Message (if you have MyChart) OR  A paper copy in the mail If you have any lab test that is abnormal or we need to change your treatment, we will call you to review the results.  Testing/Procedures: NONE  Follow-Up: At [facility], you and your health needs are our priority.  As part of our continuing mission to provide you with exceptional heart care, we have created designated Provider Care Teams.  These Care Teams include your primary Cardiologist (physician) and Advanced Practice Providers (APPs -  Physician Assistants and Nurse Practitioners) who all work together to provide you with the care you need, when you need it.  We recommend signing up for the patient portal called "MyChart".  Sign up information is provided on this After Visit Summary.  MyChart is used to connect with patients for Virtual Visits (Telemedicine).  Patients are able to view lab/test results, encounter notes, upcoming appointments, etc.  Non-urgent messages can be sent to your provider as well.   To learn more about what you can do with MyChart, go to [email or web address].    Your next appointment:   1st AVAILABLE   Provider:   Nanetta Batty, MD

## 2023-04-07 NOTE — Telephone Encounter (Signed)
Tried to call pt-PHONE JUST KEEPS RINGING-NO VM . Will try again later  Since Pt was unable to get BMET today will have her do again in 2 months. Will mail AVS with lab slip. Pt has no VM(Hm phone just rings and hangs up) will have to call again later.

## 2023-04-07 NOTE — Telephone Encounter (Signed)
IF/WHEN PT CALLS BACK GO OVER: WatchPAT?  Is a FDA cleared portable home sleep study test that uses a watch and 3 points of contact to monitor 7 different channels, including your heart rate, oxygen saturations, body position, snoring, and chest motion.  The study is easy to use from the comfort of your own home and accurately detect sleep apnea.  Before bed, you attach the chest sensor, attached the sleep apnea bracelet to your nondominant hand, and attach the finger probe.  After the study, the raw data is downloaded from the watch and scored for apnea events.   For more information: [email or web address]  Patient Testing Instructions:  Do not put battery into the device until bedtime when you are ready to begin the test. Please call the support number if you need assistance after following the instructions below: 24 hour support line- [phone] or [facility] support at [phone] (option 2)  Download the IntelWatchPAT One" app through the google play store or App Store  Be sure to turn on or enable access to bluetooth in settlings on your smartphone/ device  Make sure no other bluetooth devices are on and within the vicinity of your smartphone/ device and WatchPAT watch during testing.  Make sure to leave your smart phone/ device plugged in and charging all night.  When ready for bed:  Follow the instructions step by step in the WatchPAT One App to activate the testing device. For additional instructions, including video instruction, visit the WatchPAT One video on Youtube. You can search for WatchPat One within Youtube (video is 4 minutes and 18 seconds) or enter: https://youtube/watch?[email or web address]_vbiwxE Please note: You will be prompted to enter a Pin to connect via bluetooth when starting the test. The PIN will be assigned to you when you receive the test.  The device is disposable, but it recommended that you retain the device until you receive a call letting you know the  study has been received and the results have been interpreted.  We will let you know if the study did not transmit to [place] properly after the test is completed. You do not need to call us to confirm the receipt of the test.  Please complete the test within 48 hours of receiving PIN.   Frequently Asked Questions:  What is Watch Dennie Bible one?  A single use fully disposable home sleep apnea testing device and will not need to be returned after completion.  What are the requirements to use WatchPAT one?  The be able to have a successful watchpat one sleep study, you should have your Watch pat one device, your smart phone, watch pat one app, your PIN number and Internet access What type of phone do I need?  You should have a smart phone that uses Android 5.1 and above or any Iphone with IOS 10 and above How can I download the WatchPAT one app?  Based on your device type search for WatchPAT one app either in google play for android devices or APP store for Iphone's Where will I get my PIN for the study?  Your PIN will be provided by your physician's office. It is used for authentication and if you lose/forget your PIN, please reach out to your providers office.  I do not have Internet at home. Can I do WatchPAT one study?  WatchPAT One needs Internet connection throughout the night to be able to transmit the sleep data. You can use your home/local internet or your cellular's data package. However, it is always  recommended to use home/local Internet. It is estimated that between 20MB-30MB will be used with each study.However, the application will be looking for space in the phone to start the study.  What happens if I lose internet or bluetooth connection?  During the internet disconnection, your phone will not be able to transmit the sleep data. All the data, will be stored in your phone. As soon as the internet connection is back on, the phone will being sending the sleep data. During the bluetooth  disconnection, WatchPAT one will not be able to to send the sleep data to your phone. Data will be kept in the [facility] one until two devices have bluetooth connection back on. As soon as the connection is back on, WatchPAT one will send the sleep data to the phone.  How long do I need to wear the WatchPAT one?  After you start the study, you should wear the device at least 6 hours.  How far should I keep my phone from the device?  During the night, your phone should be within 15 feet.  What happens if I leave the room for restroom or other reasons?  Leaving the room for any reason will not cause any problem. As soon as your get back to the room, both devices will reconnect and will continue to send the sleep data. Can I use my phone during the sleep study?  Yes, you can use your phone as usual during the study. But it is recommended to put your watchpat one on when you are ready to go to bed.  How will I get my study results?  A soon as you completed your study, your sleep data will be sent to the provider. They will then share the results with you when they are ready.

## 2023-04-07 NOTE — Telephone Encounter (Signed)
Tried to call mobile number again, phone just keeps ringing. Will have to try again later.

## 2023-04-08 DIAGNOSIS — Z9181 History of falling: Secondary | ICD-10-CM | POA: Diagnosis not present

## 2023-04-08 DIAGNOSIS — K219 Gastro-esophageal reflux disease without esophagitis: Secondary | ICD-10-CM | POA: Diagnosis not present

## 2023-04-08 DIAGNOSIS — R42 Dizziness and giddiness: Secondary | ICD-10-CM | POA: Diagnosis not present

## 2023-04-08 DIAGNOSIS — E7800 Pure hypercholesterolemia, unspecified: Secondary | ICD-10-CM | POA: Diagnosis not present

## 2023-04-08 DIAGNOSIS — E1151 Type 2 diabetes mellitus with diabetic peripheral angiopathy without gangrene: Secondary | ICD-10-CM | POA: Diagnosis not present

## 2023-04-08 DIAGNOSIS — L89626 Pressure-induced deep tissue damage of left heel: Secondary | ICD-10-CM | POA: Diagnosis not present

## 2023-04-08 DIAGNOSIS — I10 Essential (primary) hypertension: Secondary | ICD-10-CM | POA: Diagnosis not present

## 2023-04-08 DIAGNOSIS — L89616 Pressure-induced deep tissue damage of right heel: Secondary | ICD-10-CM | POA: Diagnosis not present

## 2023-04-08 DIAGNOSIS — D649 Anemia, unspecified: Secondary | ICD-10-CM | POA: Diagnosis not present

## 2023-04-08 DIAGNOSIS — Z7982 Long term (current) use of aspirin: Secondary | ICD-10-CM | POA: Diagnosis not present

## 2023-04-08 DIAGNOSIS — E1142 Type 2 diabetes mellitus with diabetic polyneuropathy: Secondary | ICD-10-CM | POA: Diagnosis not present

## 2023-04-09 DIAGNOSIS — E1151 Type 2 diabetes mellitus with diabetic peripheral angiopathy without gangrene: Secondary | ICD-10-CM | POA: Diagnosis not present

## 2023-04-09 DIAGNOSIS — E1142 Type 2 diabetes mellitus with diabetic polyneuropathy: Secondary | ICD-10-CM | POA: Diagnosis not present

## 2023-04-09 DIAGNOSIS — Z9181 History of falling: Secondary | ICD-10-CM | POA: Diagnosis not present

## 2023-04-09 DIAGNOSIS — E7800 Pure hypercholesterolemia, unspecified: Secondary | ICD-10-CM | POA: Diagnosis not present

## 2023-04-09 DIAGNOSIS — D649 Anemia, unspecified: Secondary | ICD-10-CM | POA: Diagnosis not present

## 2023-04-09 DIAGNOSIS — L89626 Pressure-induced deep tissue damage of left heel: Secondary | ICD-10-CM | POA: Diagnosis not present

## 2023-04-09 DIAGNOSIS — L89616 Pressure-induced deep tissue damage of right heel: Secondary | ICD-10-CM | POA: Diagnosis not present

## 2023-04-09 DIAGNOSIS — Z7982 Long term (current) use of aspirin: Secondary | ICD-10-CM | POA: Diagnosis not present

## 2023-04-09 DIAGNOSIS — I10 Essential (primary) hypertension: Secondary | ICD-10-CM | POA: Diagnosis not present

## 2023-04-09 DIAGNOSIS — K219 Gastro-esophageal reflux disease without esophagitis: Secondary | ICD-10-CM | POA: Diagnosis not present

## 2023-04-10 DIAGNOSIS — E1151 Type 2 diabetes mellitus with diabetic peripheral angiopathy without gangrene: Secondary | ICD-10-CM | POA: Diagnosis not present

## 2023-04-10 DIAGNOSIS — K219 Gastro-esophageal reflux disease without esophagitis: Secondary | ICD-10-CM | POA: Diagnosis not present

## 2023-04-10 DIAGNOSIS — D649 Anemia, unspecified: Secondary | ICD-10-CM | POA: Diagnosis not present

## 2023-04-10 DIAGNOSIS — L89626 Pressure-induced deep tissue damage of left heel: Secondary | ICD-10-CM | POA: Diagnosis not present

## 2023-04-10 DIAGNOSIS — Z9181 History of falling: Secondary | ICD-10-CM | POA: Diagnosis not present

## 2023-04-10 DIAGNOSIS — Z7982 Long term (current) use of aspirin: Secondary | ICD-10-CM | POA: Diagnosis not present

## 2023-04-10 DIAGNOSIS — I10 Essential (primary) hypertension: Secondary | ICD-10-CM | POA: Diagnosis not present

## 2023-04-10 DIAGNOSIS — E7800 Pure hypercholesterolemia, unspecified: Secondary | ICD-10-CM | POA: Diagnosis not present

## 2023-04-10 DIAGNOSIS — L89616 Pressure-induced deep tissue damage of right heel: Secondary | ICD-10-CM | POA: Diagnosis not present

## 2023-04-10 DIAGNOSIS — E1142 Type 2 diabetes mellitus with diabetic polyneuropathy: Secondary | ICD-10-CM | POA: Diagnosis not present

## 2023-04-12 DIAGNOSIS — I10 Essential (primary) hypertension: Secondary | ICD-10-CM | POA: Diagnosis not present

## 2023-04-12 DIAGNOSIS — R931 Abnormal findings on diagnostic imaging of heart and coronary circulation: Secondary | ICD-10-CM | POA: Diagnosis not present

## 2023-04-12 DIAGNOSIS — R42 Dizziness and giddiness: Secondary | ICD-10-CM | POA: Diagnosis not present

## 2023-04-13 ENCOUNTER — Encounter: Payer: Self-pay | Admitting: Emergency Medicine

## 2023-04-13 ENCOUNTER — Telehealth: Payer: Self-pay | Admitting: *Deleted

## 2023-04-13 LAB — HEPATIC FUNCTION PANEL
ALT: 17 IU/L (ref 0–32)
AST: 24 IU/L (ref 0–40)
Albumin: 4.1 g/dL (ref 3.6–4.6)
Alkaline Phosphatase: 77 IU/L (ref 44–121)
Bilirubin Total: 0.8 mg/dL (ref 0.0–1.2)
Bilirubin, Direct: 0.22 mg/dL (ref 0.00–0.40)
Total Protein: 6.1 g/dL (ref 6.0–8.5)

## 2023-04-13 LAB — LIPID PANEL
Chol/HDL Ratio: 2.5 ratio (ref 0.0–4.4)
Cholesterol, Total: 124 mg/dL (ref 100–199)
HDL: 50 mg/dL (ref >39)
LDL Chol Calc (NIH): 57 mg/dL (ref 0–99)
Triglycerides: 89 mg/dL (ref 0–149)
VLDL Cholesterol Cal: 17 mg/dL (ref 5–40)

## 2023-04-13 NOTE — Telephone Encounter (Signed)
   Patient Name: [name]  DOB: [birth year] MRN: [id number]  Primary Cardiologist: Nanetta Batty, MD  Chart reviewed as part of pre-operative protocol coverage.   There is no need to interrupt blood thinner therapy.  IF MULTIPLE EXTRACTIONS OR COMPLEX DENTAL PROCEDURES: (follow standard pre-op clearance process). The patient was advised that if she develops new symptoms prior to surgery to contact our office to arrange for a follow-up visit, and she verbalized understanding.  SBE prophylaxis is not  required for the patient from a cardiac standpoint.  I will route this recommendation to the requesting party via Epic fax function and remove from pre-op pool.  Please call with questions.  Joni Reining, NP 04/13/2023, 8:37 AM

## 2023-04-13 NOTE — Telephone Encounter (Signed)
   Pre-operative Risk Assessment    Patient Name: [name]  DOB: 06-Jan-1931 MRN: [id number]     Request for Surgical Clearance    Procedure:  Dental Extraction - Amount of Teeth to be Pulled:  5  Date of Surgery:  Clearance TBD                                 Surgeon:  Georgia Lopes, D.M.D., P.A Surgeon's Group or Practice Name:  [facility], DMD Phone number:  [phone] Fax number:  [phone]   Type of Clearance Requested:   - Medical  - Pharmacy:  Hold Aspirin NOT INIDCATED   Type of Anesthesia:  Local / NITROUS   Additional requests/questions:    [name]   04/13/2023, 8:15 AM

## 2023-04-14 ENCOUNTER — Telehealth: Payer: Self-pay | Admitting: Cardiovascular Disease

## 2023-04-14 NOTE — Telephone Encounter (Signed)
Pt's daughter is requesting a callback regarding pt calling her this morning about Zio stating she couldn't get any sleep because it wasn't sticking to her and was blinking a lot. She still hasn't been able to get it back on so she'd like a callback to be advised on what to do. Please advise

## 2023-04-14 NOTE — Telephone Encounter (Signed)
Call to daughter. Advised to call the monitor supplier  so they can help trouble shoot

## 2023-04-15 DIAGNOSIS — I10 Essential (primary) hypertension: Secondary | ICD-10-CM | POA: Diagnosis not present

## 2023-04-15 DIAGNOSIS — E1142 Type 2 diabetes mellitus with diabetic polyneuropathy: Secondary | ICD-10-CM | POA: Diagnosis not present

## 2023-04-15 DIAGNOSIS — L89616 Pressure-induced deep tissue damage of right heel: Secondary | ICD-10-CM | POA: Diagnosis not present

## 2023-04-15 DIAGNOSIS — D649 Anemia, unspecified: Secondary | ICD-10-CM | POA: Diagnosis not present

## 2023-04-15 DIAGNOSIS — Z7982 Long term (current) use of aspirin: Secondary | ICD-10-CM | POA: Diagnosis not present

## 2023-04-15 DIAGNOSIS — K219 Gastro-esophageal reflux disease without esophagitis: Secondary | ICD-10-CM | POA: Diagnosis not present

## 2023-04-15 DIAGNOSIS — L89626 Pressure-induced deep tissue damage of left heel: Secondary | ICD-10-CM | POA: Diagnosis not present

## 2023-04-15 DIAGNOSIS — E7800 Pure hypercholesterolemia, unspecified: Secondary | ICD-10-CM | POA: Diagnosis not present

## 2023-04-15 DIAGNOSIS — E1151 Type 2 diabetes mellitus with diabetic peripheral angiopathy without gangrene: Secondary | ICD-10-CM | POA: Diagnosis not present

## 2023-04-15 DIAGNOSIS — Z9181 History of falling: Secondary | ICD-10-CM | POA: Diagnosis not present

## 2023-04-21 DIAGNOSIS — E7800 Pure hypercholesterolemia, unspecified: Secondary | ICD-10-CM | POA: Diagnosis not present

## 2023-04-21 DIAGNOSIS — Z7982 Long term (current) use of aspirin: Secondary | ICD-10-CM | POA: Diagnosis not present

## 2023-04-21 DIAGNOSIS — K219 Gastro-esophageal reflux disease without esophagitis: Secondary | ICD-10-CM | POA: Diagnosis not present

## 2023-04-21 DIAGNOSIS — L89616 Pressure-induced deep tissue damage of right heel: Secondary | ICD-10-CM | POA: Diagnosis not present

## 2023-04-21 DIAGNOSIS — E1151 Type 2 diabetes mellitus with diabetic peripheral angiopathy without gangrene: Secondary | ICD-10-CM | POA: Diagnosis not present

## 2023-04-21 DIAGNOSIS — D649 Anemia, unspecified: Secondary | ICD-10-CM | POA: Diagnosis not present

## 2023-04-21 DIAGNOSIS — L89626 Pressure-induced deep tissue damage of left heel: Secondary | ICD-10-CM | POA: Diagnosis not present

## 2023-04-21 DIAGNOSIS — E1142 Type 2 diabetes mellitus with diabetic polyneuropathy: Secondary | ICD-10-CM | POA: Diagnosis not present

## 2023-04-21 DIAGNOSIS — I10 Essential (primary) hypertension: Secondary | ICD-10-CM | POA: Diagnosis not present

## 2023-04-21 DIAGNOSIS — Z9181 History of falling: Secondary | ICD-10-CM | POA: Diagnosis not present

## 2023-04-22 DIAGNOSIS — I10 Essential (primary) hypertension: Secondary | ICD-10-CM | POA: Diagnosis not present

## 2023-04-22 DIAGNOSIS — R42 Dizziness and giddiness: Secondary | ICD-10-CM | POA: Diagnosis not present

## 2023-04-23 DIAGNOSIS — Z9181 History of falling: Secondary | ICD-10-CM | POA: Diagnosis not present

## 2023-04-23 DIAGNOSIS — K219 Gastro-esophageal reflux disease without esophagitis: Secondary | ICD-10-CM | POA: Diagnosis not present

## 2023-04-23 DIAGNOSIS — L89616 Pressure-induced deep tissue damage of right heel: Secondary | ICD-10-CM | POA: Diagnosis not present

## 2023-04-23 DIAGNOSIS — L89626 Pressure-induced deep tissue damage of left heel: Secondary | ICD-10-CM | POA: Diagnosis not present

## 2023-04-23 DIAGNOSIS — E1142 Type 2 diabetes mellitus with diabetic polyneuropathy: Secondary | ICD-10-CM | POA: Diagnosis not present

## 2023-04-23 DIAGNOSIS — I10 Essential (primary) hypertension: Secondary | ICD-10-CM | POA: Diagnosis not present

## 2023-04-23 DIAGNOSIS — E1151 Type 2 diabetes mellitus with diabetic peripheral angiopathy without gangrene: Secondary | ICD-10-CM | POA: Diagnosis not present

## 2023-04-23 DIAGNOSIS — D649 Anemia, unspecified: Secondary | ICD-10-CM | POA: Diagnosis not present

## 2023-04-23 DIAGNOSIS — Z7982 Long term (current) use of aspirin: Secondary | ICD-10-CM | POA: Diagnosis not present

## 2023-04-23 DIAGNOSIS — E7800 Pure hypercholesterolemia, unspecified: Secondary | ICD-10-CM | POA: Diagnosis not present

## 2023-05-06 ENCOUNTER — Ambulatory Visit: Payer: 59

## 2023-05-06 ENCOUNTER — Encounter: Payer: Self-pay | Admitting: Podiatry

## 2023-05-06 ENCOUNTER — Ambulatory Visit (INDEPENDENT_AMBULATORY_CARE_PROVIDER_SITE_OTHER): Payer: 59 | Admitting: Podiatry

## 2023-05-06 VITALS — Ht 67.0 in | Wt 133.0 lb

## 2023-05-06 DIAGNOSIS — M79675 Pain in left toe(s): Secondary | ICD-10-CM | POA: Diagnosis not present

## 2023-05-06 DIAGNOSIS — E1149 Type 2 diabetes mellitus with other diabetic neurological complication: Secondary | ICD-10-CM

## 2023-05-06 DIAGNOSIS — L97512 Non-pressure chronic ulcer of other part of right foot with fat layer exposed: Secondary | ICD-10-CM | POA: Diagnosis not present

## 2023-05-06 DIAGNOSIS — S99921A Unspecified injury of right foot, initial encounter: Secondary | ICD-10-CM

## 2023-05-06 DIAGNOSIS — M79674 Pain in right toe(s): Secondary | ICD-10-CM | POA: Diagnosis not present

## 2023-05-06 DIAGNOSIS — B351 Tinea unguium: Secondary | ICD-10-CM

## 2023-05-06 DIAGNOSIS — I739 Peripheral vascular disease, unspecified: Secondary | ICD-10-CM | POA: Diagnosis not present

## 2023-05-06 DIAGNOSIS — L84 Corns and callosities: Secondary | ICD-10-CM | POA: Diagnosis not present

## 2023-05-06 MED ORDER — MUPIROCIN 2 % EX OINT: 1.0000 | TOPICAL_OINTMENT | Freq: Two times a day (BID) | CUTANEOUS | 2 refills | Status: DC

## 2023-05-06 MED ORDER — DOXYCYCLINE HYCLATE 100 MG PO TABS: 100.0000 mg | ORAL_TABLET | Freq: Two times a day (BID) | ORAL | 0 refills | Status: DC

## 2023-05-09 NOTE — Progress Notes (Signed)
Subjective: Chief Complaint  Patient presents with   Nail Problem    nail care/callus trim and greater toe pain, hit right hallux on kitchen furniture extremely painful and side foot callouses hurting at night    89 year old female presents the office today for thick, elongated toenails that she is not able to trim herself as well as for preulcerative painful callus on the lateral aspect the right foot.  She also states about a month ago she had her right big toe.  Unfortunate she was not able to move the office and she did not want to go to urgent care or the emergency room per her daughter she said no treatment for this.  For the callus at the tip of the toe.  Objective: AAO x3, NAD-daughter present DP/PT pulses palpable 1/4 bilaterally, CRT less than 3 seconds Nails are hypertrophic, dystrophic, brittle, discolored, elongated 10. No surrounding redness or drainage. Tenderness nails 1-5 bilaterally.   Hyperkeratotic lesion noted fifth metatarsal base right foot and left foot and bilateral heel however after debridement there is no underlying ulceration drainage or any signs of infection noted today.  Dark discoloration which is chronic to the posterior aspect of bilateral heels and this is symmetrical in the from pressure.  This appears to be stable and not any change or worsening. At the distal aspect of the right hallux there is a hyperkeratotic lesion.  Upon debridement there is a wound measuring 0.5 x 0.2 x 0.1 cm.  There is no probing, and or tunneling.  There is no surrounding erythema, ascending cellulitis.  There is no fluctuation or crepitation.  There is no malodor. No pain with calf compression, swelling, warmth, erythema  Assessment: 89 year old female with symptomatic onychomycosis, preulcerative callus right foot  Plan: -All treatment options discussed with the patient including all alternatives, risks, complications.  -X-rays obtained reviewed of the right foot.  3 views of  the foot obtained.  Particular on the hallux there is no evidence of acute fracture or cortical changes suggest osteomyelitis. -There is a hyperkeratotic lesion to reveal the underlying ulceration.  The left #312 with scalpel to sharply debride nonviable, devitalized tissue in order to promote wound healing.  Not able to measurement prior to debridement of this, callus and post with measurements are noted above.  There is no bleeding occurred during the procedure.  Cleaned area with saline.  Silvadene applied followed by dressing. -Continue daily dressing changes with mupirocin.  Prescribed doxycycline.  Surgical shoe dispensed for offloading Facilitate healing. -Upon further review of her chart her arterial studies are decreased.  I would have her follow-up with Dr. Allyson Sabal.  Will contact the patient as well to let her know this. New arterial study ordered.   Return in about 2 weeks (around 05/20/2023).  Vivi Barrack DPM

## 2023-05-19 ENCOUNTER — Encounter: Payer: Self-pay | Admitting: Cardiovascular Disease

## 2023-05-19 ENCOUNTER — Ambulatory Visit: Payer: 59 | Attending: Cardiovascular Disease | Admitting: Cardiovascular Disease

## 2023-05-19 VITALS — BP 142/78 | HR 63 | Ht 67.0 in | Wt 126.6 lb

## 2023-05-19 DIAGNOSIS — E782 Mixed hyperlipidemia: Secondary | ICD-10-CM | POA: Diagnosis not present

## 2023-05-19 DIAGNOSIS — Z9889 Other specified postprocedural states: Secondary | ICD-10-CM

## 2023-05-19 DIAGNOSIS — I10 Essential (primary) hypertension: Secondary | ICD-10-CM

## 2023-05-19 DIAGNOSIS — I70229 Atherosclerosis of native arteries of extremities with rest pain, unspecified extremity: Secondary | ICD-10-CM | POA: Diagnosis not present

## 2023-05-19 DIAGNOSIS — R42 Dizziness and giddiness: Secondary | ICD-10-CM

## 2023-05-19 DIAGNOSIS — I2510 Atherosclerotic heart disease of native coronary artery without angina pectoris: Secondary | ICD-10-CM | POA: Insufficient documentation

## 2023-05-19 NOTE — Assessment & Plan Note (Signed)
History of essential hypertension blood pressure measured today at 142/78.  She is on metoprolol.

## 2023-05-19 NOTE — Progress Notes (Signed)
05/19/2023 [name]   Jan 14, 1931  [id number]  Primary Physician Irena Reichmann, DO Primary Cardiologist: Runell Gess MD Nicholes Calamity, [place]  HPI:  [name] is a 89 y.o.  thin appearing widowed African-American female mother of 1 child, grandmother of 8 grandchildren is accompanied by her daughter [name] today.Marland Kitchen  She was referred by Dr. Ardelle Anton, her podiatrist, for right heel wound.    I last saw her in the office 07/16/2020.  She did do housework in her younger years.  Risk factors include treated hypertension, diabetes and hyperlipidemia.  Her father died of a myocardial infarction in his 67s.  She is never had a heart attack or stroke.  She denies chest pain or shortness of breath.  She has had some discoloration of her right heel over the last 3 to 4 months and did see Dr. Ardelle Anton for this.  He did in office ABIs revealing a left ABI of 0.93 and on the right that was noncompressible.  She denies claudication.  She does have some pain in the right heel at night.   Since I saw her in the office 2-1/2 years ago she was hospitalized on August 18 with dizziness.  Her workup was unrevealing.  Her troponins were low and flat.  2D echo was essentially normal.  She had an elevated coronary calcium score of greater than 3000 and a CTA FFR that revealed significant disease in the mid RCA although because of her age and absence of symptoms and invasive approach was not pursued.  She does live alone and is fairly independent.  She runs walker but does not drive.     Current Meds  Medication Sig   aspirin EC 81 MG tablet Take 1 tablet (81 mg total) by mouth daily. Swallow whole.   metoprolol succinate (TOPROL-XL) 25 MG 24 hr tablet Take 0.5 tablets (12.5 mg total) by mouth daily.   mupirocin ointment (BACTROBAN) 2 % Apply 1 Application topically 2 (two) times daily.   Propylene Glycol (SYSTANE BALANCE OP) Place 1 drop into both eyes daily as needed (for dry eye).     No Known  Allergies  Social History   Socioeconomic History   Marital status: Widowed    Spouse name: Not on file   Number of children: Not on file   Years of education: Not on file   Highest education level: Not on file  Occupational History   Not on file  Tobacco Use   Smoking status: Never   Smokeless tobacco: Never  Substance and Sexual Activity   Alcohol use: No   Drug use: No   Sexual activity: Not on file  Other Topics Concern   Not on file  Social History Narrative   Not on file   Social Determinants of Health   Financial Resource Strain: Not on file  Food Insecurity: No Food Insecurity (03/22/2023)   Hunger Vital Sign    Worried About Running Out of Food in the Last Year: Never true    Ran Out of Food in the Last Year: Never true  Transportation Needs: No Transportation Needs (03/22/2023)   PRAPARE - Administrator, Civil Service (Medical): No    Lack of Transportation (Non-Medical): No  Physical Activity: Not on file  Stress: Not on file  Social Connections: Not on file  Intimate Partner Violence: Not on file     Review of Systems: General: negative for chills, fever, night sweats or weight changes.  Cardiovascular:  negative for chest pain, dyspnea on exertion, edema, orthopnea, palpitations, paroxysmal nocturnal dyspnea or shortness of breath Dermatological: negative for rash Respiratory: negative for cough or wheezing Urologic: negative for hematuria Abdominal: negative for nausea, vomiting, diarrhea, bright red blood per rectum, melena, or hematemesis Neurologic: negative for visual changes, syncope, or dizziness All other systems reviewed and are otherwise negative except as noted above.    Blood pressure (!) 142/78, pulse 63, height 5\' 7"  (1.702 m), weight 126 lb 9.6 oz (57.4 kg), SpO2 100%.  General appearance: alert and no distress Neck: no adenopathy, no carotid bruit, no JVD, supple, symmetrical, trachea midline, and thyroid not enlarged,  symmetric, no tenderness/mass/nodules Lungs: clear to auscultation bilaterally Heart: regular rate and rhythm, S1, S2 normal, no murmur, click, rub or gallop Extremities: extremities normal, atraumatic, no cyanosis or edema Pulses: 2+ and symmetric Skin: Skin color, texture, turgor normal. No rashes or lesions Neurologic: Grossly normal  EKG not performed today      ASSESSMENT AND PLAN:   Essential hypertension History of essential hypertension blood pressure measured today at 142/78.  She is on metoprolol.  Hyperlipidemia History of hyperlipidemia on statin therapy with lipid profile performed 04/12/2023 revealing total cholesterol 124, LDL 57 and HDL of 50.  Critical limb ischemia with history of revascularization of same extremity (HCC) History of critical limb ischemia with hyperpigmentation of both of her heels.  She recently saw Dr. Loreta Ave for callus on her right great toe.  She did have Doppler studies in our office revealing a left ABI of 0.93 and the right there was incompressible.  She denies claudication.  She does not have evidence of critical ischemia.  Dizziness History of dizziness of unclear etiology.  She did have an event monitor performed 03/12/2023 that showed an average heart rate of 59 with frequent PACs (5% burden), occasional PVCs and short runs of SVT which I did not think were contributory.  Coronary artery disease Was recently hospitalized 03/21/2023 for 3 days.  This was for dizziness.  Her troponins were low and flat.  Her 2D echo was essentially normal.  A coronary CTA revealed a coronary calcium score of 3341 with FFR analysis suggesting severe stenosis in the mid to distal RCA.  Given the patient's age, and lack of symptoms it was decided not to pursue angiography and intervention.  Since discharge she denies chest pain.     Runell Gess MD FACP,FACC,FAHA, [facility] 05/19/2023 3:05 PM

## 2023-05-19 NOTE — Assessment & Plan Note (Signed)
History of hyperlipidemia on statin therapy with lipid profile performed 04/12/2023 revealing total cholesterol 124, LDL 57 and HDL of 50.

## 2023-05-19 NOTE — Patient Instructions (Signed)
Medication Instructions:  Your physician recommends that you continue on your current medications as directed. Please refer to the Current Medication list given to you today.  *If you need a refill on your cardiac medications before your next appointment, please call your pharmacy*    Follow-Up: At [facility], you and your health needs are our priority.  As part of our continuing mission to provide you with exceptional heart care, we have created designated Provider Care Teams.  These Care Teams include your primary Cardiologist (physician) and Advanced Practice Providers (APPs -  Physician Assistants and Nurse Practitioners) who all work together to provide you with the care you need, when you need it.  We recommend signing up for the patient portal called "MyChart".  Sign up information is provided on this After Visit Summary.  MyChart is used to connect with patients for Virtual Visits (Telemedicine).  Patients are able to view lab/test results, encounter notes, upcoming appointments, etc.  Non-urgent messages can be sent to your provider as well.   To learn more about what you can do with MyChart, go to [email or web address].    Your next appointment:   6 month(s)  Provider:   Reather Littler, NP      Then, Nanetta Batty, MD will plan to see you again in 12 month(s).

## 2023-05-19 NOTE — Assessment & Plan Note (Signed)
Was recently hospitalized 03/21/2023 for 3 days.  This was for dizziness.  Her troponins were low and flat.  Her 2D echo was essentially normal.  A coronary CTA revealed a coronary calcium score of 3341 with FFR analysis suggesting severe stenosis in the mid to distal RCA.  Given the patient's age, and lack of symptoms it was decided not to pursue angiography and intervention.  Since discharge she denies chest pain.

## 2023-05-19 NOTE — Assessment & Plan Note (Signed)
History of critical limb ischemia with hyperpigmentation of both of her heels.  She recently saw Dr. Loreta Ave for callus on her right great toe.  She did have Doppler studies in our office revealing a left ABI of 0.93 and the right there was incompressible.  She denies claudication.  She does not have evidence of critical ischemia.

## 2023-05-19 NOTE — Assessment & Plan Note (Signed)
History of dizziness of unclear etiology.  She did have an event monitor performed 03/12/2023 that showed an average heart rate of 59 with frequent PACs (5% burden), occasional PVCs and short runs of SVT which I did not think were contributory.

## 2023-05-24 ENCOUNTER — Ambulatory Visit (INDEPENDENT_AMBULATORY_CARE_PROVIDER_SITE_OTHER): Payer: 59 | Admitting: Podiatry

## 2023-05-24 DIAGNOSIS — L97512 Non-pressure chronic ulcer of other part of right foot with fat layer exposed: Secondary | ICD-10-CM

## 2023-05-25 DIAGNOSIS — E785 Hyperlipidemia, unspecified: Secondary | ICD-10-CM | POA: Diagnosis not present

## 2023-05-25 DIAGNOSIS — I129 Hypertensive chronic kidney disease with stage 1 through stage 4 chronic kidney disease, or unspecified chronic kidney disease: Secondary | ICD-10-CM | POA: Diagnosis not present

## 2023-05-25 DIAGNOSIS — E1122 Type 2 diabetes mellitus with diabetic chronic kidney disease: Secondary | ICD-10-CM | POA: Diagnosis not present

## 2023-05-25 DIAGNOSIS — N1831 Chronic kidney disease, stage 3a: Secondary | ICD-10-CM | POA: Diagnosis not present

## 2023-05-26 NOTE — Progress Notes (Signed)
Subjective: No chief complaint on file.  89 year old female presents the office with her daughter for follow evaluation of the ulceration, pain to the tip of the right big toe.  She states that she still is tenderness but she has not seen any drainage or pus or any swelling or redness.  She still been wearing a surgical shoe.  She completed her doxycycline.  She is keep antibiotic ointment on the area daily.  She is not reporting fevers or chills.  Objective: AAO x3, NAD DP/PT pulses palpable bilaterally, CRT less than 3 seconds Hyperkeratotic tissue at the distal aspect the right hallux.  Upon debridement this is still able to visualize the wound that measures 0.2 x 0.2 x 0.2 cm.  There is no probing, and or tunneling.  There is no surrounding erythema, ascending size.  No drainage or pus.  There is no fluctuation, crepitation, malodor. No pain with calf compression, swelling, warmth, erythema  Assessment: Ulceration right hallux  Plan: -All treatment options discussed with the patient including all alternatives, risks, complications.  -Medically necessary wound debridement was performed today.  Sharp debrided hyperkeratotic lesion to reveal the underlying ulceration with a #312 with scalpel.  I debrided the ulceration down to healthy tissue removed nonviable, devitalized tissue to promote wound healing.  There is no blood loss.  Cleaned with saline.  A small amount of antibiotic ointment was applied followed by dressing.  Continue surgical shoe, offloading as well as daily dressing changes.  She has follow-up with Dr. Gery Pray in regards to her circulation and the wound has improved some so we will continue to monitor this closely.  -Patient encouraged to call the office with any questions, concerns, change in symptoms.   Vivi Barrack DPM

## 2023-06-03 ENCOUNTER — Telehealth: Payer: Self-pay | Admitting: Podiatry

## 2023-06-03 NOTE — Telephone Encounter (Signed)
CPT [id number] and [id number] do not need a prior auth with dx code I73.9 as per the [facility] automated line.  Last four digits of ref #[id number].  Falecha notified at [facility]

## 2023-06-08 ENCOUNTER — Ambulatory Visit (HOSPITAL_COMMUNITY)
Admission: RE | Admit: 2023-06-08 | Discharge: 2023-06-08 | Disposition: A | Discharge status: Home / Self Care | Payer: 59 | Source: Ambulatory Visit | Attending: Cardiovascular Disease | Admitting: Cardiovascular Disease

## 2023-06-08 DIAGNOSIS — I739 Peripheral vascular disease, unspecified: Secondary | ICD-10-CM | POA: Insufficient documentation

## 2023-06-09 LAB — VAS US PAD ABI: Left ABI: 0.82

## 2023-06-14 ENCOUNTER — Telehealth: Payer: Self-pay

## 2023-06-14 NOTE — Telephone Encounter (Signed)
Spoke to patient's daughter, Magda Paganini, and advised. She reports that the patient's toe is getting worse, that it is very painful. She can't sleep at night due to the pain in her toe. She is scheduled to see Dr Ardelle Anton on 06/28/23 - Is there something we can do for her in the meantime? Or does she need to come in for an appointment sooner? Please advise - Thanks

## 2023-06-14 NOTE — Telephone Encounter (Signed)
-----   Message from Vivi Barrack sent at 06/10/2023  1:44 PM EST ----- Herbert Seta, can you let her know that the ultrasound is unchanged compared to prior (she has seen Dr. Allyson Sabal before). If the wound worsens, or has any issues let us know. Thanks!

## 2023-06-15 DIAGNOSIS — I129 Hypertensive chronic kidney disease with stage 1 through stage 4 chronic kidney disease, or unspecified chronic kidney disease: Secondary | ICD-10-CM | POA: Diagnosis not present

## 2023-06-15 DIAGNOSIS — E1122 Type 2 diabetes mellitus with diabetic chronic kidney disease: Secondary | ICD-10-CM | POA: Diagnosis not present

## 2023-06-15 DIAGNOSIS — E785 Hyperlipidemia, unspecified: Secondary | ICD-10-CM | POA: Diagnosis not present

## 2023-06-15 DIAGNOSIS — R269 Unspecified abnormalities of gait and mobility: Secondary | ICD-10-CM | POA: Diagnosis not present

## 2023-06-15 DIAGNOSIS — Z0000 Encounter for general adult medical examination without abnormal findings: Secondary | ICD-10-CM | POA: Diagnosis not present

## 2023-06-15 DIAGNOSIS — E1143 Type 2 diabetes mellitus with diabetic autonomic (poly)neuropathy: Secondary | ICD-10-CM | POA: Diagnosis not present

## 2023-06-15 DIAGNOSIS — N1831 Chronic kidney disease, stage 3a: Secondary | ICD-10-CM | POA: Diagnosis not present

## 2023-06-15 DIAGNOSIS — M79676 Pain in unspecified toe(s): Secondary | ICD-10-CM | POA: Diagnosis not present

## 2023-06-15 DIAGNOSIS — R634 Abnormal weight loss: Secondary | ICD-10-CM | POA: Diagnosis not present

## 2023-06-17 ENCOUNTER — Ambulatory Visit (INDEPENDENT_AMBULATORY_CARE_PROVIDER_SITE_OTHER): Payer: 59 | Admitting: Podiatry

## 2023-06-17 ENCOUNTER — Encounter: Payer: Self-pay | Admitting: Podiatry

## 2023-06-17 DIAGNOSIS — L89626 Pressure-induced deep tissue damage of left heel: Secondary | ICD-10-CM | POA: Diagnosis not present

## 2023-06-17 DIAGNOSIS — L89616 Pressure-induced deep tissue damage of right heel: Secondary | ICD-10-CM

## 2023-06-20 NOTE — Progress Notes (Signed)
  Subjective:  Patient ID: [name], female    DOB: Mar 06, 1931,  MRN: [id number]  Chief Complaint  Patient presents with   Foot Pain    Right foot toe pain and outer side of foot     Discussed the use of AI scribe software for clinical note transcription with the patient, who gave verbal consent to proceed.  History of Present Illness   The patient, with a history of diabetes, presents with bilateral foot wounds. She reports peeling on both heels and a wound on the right big toe. The patient's diabetes is well-controlled, and she is not currently on any medication for it. The patient's primary care provider recently confirmed that her blood work is good. The patient had an ultrasound done last week by her heart doctor to assess circulation. The patient reports that the wounds are painful, especially at night, and feels like something is jabbing across her foot. She has been using a salve to manage the pain.          Objective:    Physical Exam   SKIN: Bilateral feet with non palpable pulses, feet warm, healing wound on distal tip of right hallux, deep tissue injury, pre ulcerative lesions on bilateral heel without active ulceration, open wounds, drainage, signs of infection. Pulses weakly palpable, tender to touch.              Results   DIAGNOSTIC Ultrasound: Circulation issues unchanged since previous exam (06/11/2023) ABIs: Relatively unchanged since previous exam (06/08/2023)      Assessment:   1. Pressure injury of deep tissue of left heel   2. Pressure injury of deep tissue of right heel      Plan:  Patient was evaluated and treated and all questions answered.  Assessment and Plan    Diabetic Foot Ulcers   She has bilateral heel deep tissue injuries without open lesions, draining, ulcerations, or bleeding, and poor circulation that remains stable compared to the previous year. We will advise offloading of bilateral heels and lateral ankle to prevent  worsening into pressure decubitus ulcers and continue using the prescribed salve. If there is no improvement or if the condition worsens, we will consider a referral to Dr. [name] for angiography.  Follow-up   She is scheduled to return for a visit on June 28, 2023, for re-evaluation with Dr. [name]. Should her condition worsen, she is to notify the clinic immediately.          No follow-ups on file.

## 2023-06-28 ENCOUNTER — Ambulatory Visit (INDEPENDENT_AMBULATORY_CARE_PROVIDER_SITE_OTHER): Payer: 59 | Admitting: Podiatry

## 2023-06-28 DIAGNOSIS — I739 Peripheral vascular disease, unspecified: Secondary | ICD-10-CM | POA: Diagnosis not present

## 2023-06-28 DIAGNOSIS — M79674 Pain in right toe(s): Secondary | ICD-10-CM | POA: Diagnosis not present

## 2023-06-28 DIAGNOSIS — B351 Tinea unguium: Secondary | ICD-10-CM | POA: Diagnosis not present

## 2023-06-28 DIAGNOSIS — E1149 Type 2 diabetes mellitus with other diabetic neurological complication: Secondary | ICD-10-CM | POA: Diagnosis not present

## 2023-06-28 DIAGNOSIS — L84 Corns and callosities: Secondary | ICD-10-CM | POA: Diagnosis not present

## 2023-06-28 DIAGNOSIS — M79675 Pain in left toe(s): Secondary | ICD-10-CM

## 2023-06-28 NOTE — Progress Notes (Signed)
Subjective: Chief Complaint  Patient presents with   Diabetes    [facility],      89 year old female presents the office today for thick, elongated toenails that she is not able to trim herself as well as for preulcerative painful callus on the lateral aspect the right foot.  She states that she has been keeping ointment on the wound to the tip of the right big toe this has been doing better.  She does not report any drainage or pus.  She has no fevers or chills.  Objective: AAO x3, NAD-daughter present DP/PT pulses palpable 1/4 bilaterally, CRT less than 3 seconds Nails are hypertrophic, dystrophic, brittle, discolored, elongated 10. No surrounding redness or drainage. Tenderness nails 1-5 bilaterally.   Hyperkeratotic lesion noted fifth metatarsal base right foot and left foot and bilateral heel however after debridement there is no underlying ulceration drainage or any signs of infection noted today.  Dark discoloration which is chronic to the posterior aspect of bilateral heels and this is symmetrical in the from pressure.  This appears to be stable and not any change or worsening.  Hyperkeratotic lesions distal aspect the right hallux without any underlying ulceration, drainage or signs of infection to the area is preulcerative. No pain with calf compression, swelling, warmth, erythema  Assessment: 89 year old female with symptomatic onychomycosis, preulcerative callus right foot  Plan: -All treatment options discussed with the patient including all alternatives, risks, complications.  -Sharply debrided nails x 10 without any complications or bleeding. -Sharp debrided hyperkeratotic lesions x 4 without any complications or bleeding.  Discussed moisturizer, offloading. -Daily foot inspection. -Updated ABI unchanged compared to prior.  Wound is now healed.  Return in about 2 months (around 08/28/2023).  Vivi Barrack DPM

## 2023-07-02 ENCOUNTER — Other Ambulatory Visit: Payer: Self-pay | Admitting: Cardiology

## 2023-07-21 ENCOUNTER — Ambulatory Visit: Payer: 59 | Admitting: Cardiovascular Disease

## 2023-08-05 DIAGNOSIS — E1143 Type 2 diabetes mellitus with diabetic autonomic (poly)neuropathy: Secondary | ICD-10-CM | POA: Diagnosis not present

## 2023-08-05 DIAGNOSIS — N1831 Chronic kidney disease, stage 3a: Secondary | ICD-10-CM | POA: Diagnosis not present

## 2023-08-05 DIAGNOSIS — I129 Hypertensive chronic kidney disease with stage 1 through stage 4 chronic kidney disease, or unspecified chronic kidney disease: Secondary | ICD-10-CM | POA: Diagnosis not present

## 2023-08-05 DIAGNOSIS — E1159 Type 2 diabetes mellitus with other circulatory complications: Secondary | ICD-10-CM | POA: Diagnosis not present

## 2023-08-05 DIAGNOSIS — R54 Age-related physical debility: Secondary | ICD-10-CM | POA: Diagnosis not present

## 2023-08-05 DIAGNOSIS — I2510 Atherosclerotic heart disease of native coronary artery without angina pectoris: Secondary | ICD-10-CM | POA: Diagnosis not present

## 2023-08-05 DIAGNOSIS — E1122 Type 2 diabetes mellitus with diabetic chronic kidney disease: Secondary | ICD-10-CM | POA: Diagnosis not present

## 2023-08-30 ENCOUNTER — Ambulatory Visit (INDEPENDENT_AMBULATORY_CARE_PROVIDER_SITE_OTHER): Payer: 59 | Admitting: Podiatry

## 2023-08-30 DIAGNOSIS — I739 Peripheral vascular disease, unspecified: Secondary | ICD-10-CM

## 2023-08-30 DIAGNOSIS — M79674 Pain in right toe(s): Secondary | ICD-10-CM | POA: Diagnosis not present

## 2023-08-30 DIAGNOSIS — M79675 Pain in left toe(s): Secondary | ICD-10-CM | POA: Diagnosis not present

## 2023-08-30 DIAGNOSIS — L84 Corns and callosities: Secondary | ICD-10-CM

## 2023-08-30 DIAGNOSIS — E1149 Type 2 diabetes mellitus with other diabetic neurological complication: Secondary | ICD-10-CM

## 2023-08-30 DIAGNOSIS — B351 Tinea unguium: Secondary | ICD-10-CM | POA: Diagnosis not present

## 2023-08-30 NOTE — Progress Notes (Unsigned)
Subjective: No chief complaint on file.  89 year old female presents the office today for thick, elongated toenails that she is not able to trim herself as well as for preulcerative painful callus on the lateral aspect the right foot.  She has a callus most of the right fifth metatarsal base causes a sharp pain at nighttime.  She tries to keep "[place]" on it.  No open lesions.  No drainage.  Objective: AAO x3, NAD-daughter present DP/PT pulses palpable 1/4 bilaterally, CRT less than 3 seconds Nails are hypertrophic, dystrophic, brittle, discolored, elongated 10. No surrounding redness or drainage. Tenderness nails 1-5 bilaterally.   Hyperkeratotic lesion noted fifth metatarsal base right foot and left foot and bilateral heel however after debridement there is no underlying ulceration drainage or any signs of infection noted today.  Dark discoloration which is chronic to the posterior aspect of bilateral heels and this is symmetrical in the from pressure.  This appears to be stable and not any change or worsening.  Hyperkeratotic lesions distal aspect the right hallux without any underlying ulceration, drainage or signs of infection to the area is preulcerative. No pain with calf compression, swelling, warmth, erythema  Assessment: 89 year old female with symptomatic onychomycosis, preulcerative callus right foot  Plan: -All treatment options discussed with the patient including all alternatives, risks, complications.  -Sharply debrided nails x 10 without any complications or bleeding. -Sharp debrided hyperkeratotic lesions x 4 without any complications or bleeding.  Discussed moisturizer, offloading.  No ulcerations this time but continue to monitor. -Daily foot inspection.  Return in about 9 weeks (around 11/01/2023).  Vivi Barrack DPM

## 2023-10-14 ENCOUNTER — Encounter: Payer: Self-pay | Admitting: Neurology

## 2023-10-14 ENCOUNTER — Ambulatory Visit (INDEPENDENT_AMBULATORY_CARE_PROVIDER_SITE_OTHER): Payer: 59 | Admitting: Neurology

## 2023-10-14 VITALS — BP 160/98 | HR 63 | Ht 67.0 in | Wt 130.6 lb

## 2023-10-14 DIAGNOSIS — F02818 Dementia in other diseases classified elsewhere, unspecified severity, with other behavioral disturbance: Secondary | ICD-10-CM

## 2023-10-14 DIAGNOSIS — G309 Alzheimer's disease, unspecified: Secondary | ICD-10-CM

## 2023-10-14 DIAGNOSIS — G319 Degenerative disease of nervous system, unspecified: Secondary | ICD-10-CM | POA: Insufficient documentation
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
## 2023-10-17 LAB — ATN PROFILE
A -- Beta-amyloid 42/40 Ratio: 0.099 — ABNORMAL LOW (ref >0.102)
Beta-amyloid 40: 249.53 pg/mL
Beta-amyloid 42: 24.69 pg/mL
N -- NfL, Plasma: 8.34 pg/mL (ref 0.00–11.55)
T -- p-tau181: 1.3 pg/mL — ABNORMAL HIGH (ref 0.00–0.97)

## 2023-10-26 ENCOUNTER — Encounter: Payer: Self-pay | Admitting: Podiatry

## 2023-10-26 ENCOUNTER — Ambulatory Visit (INDEPENDENT_AMBULATORY_CARE_PROVIDER_SITE_OTHER): Admitting: Podiatry

## 2023-10-26 DIAGNOSIS — M79674 Pain in right toe(s): Secondary | ICD-10-CM | POA: Diagnosis not present

## 2023-10-26 DIAGNOSIS — M79675 Pain in left toe(s): Secondary | ICD-10-CM | POA: Diagnosis not present

## 2023-10-26 DIAGNOSIS — E1149 Type 2 diabetes mellitus with other diabetic neurological complication: Secondary | ICD-10-CM | POA: Diagnosis not present

## 2023-10-26 DIAGNOSIS — I739 Peripheral vascular disease, unspecified: Secondary | ICD-10-CM | POA: Diagnosis not present

## 2023-10-26 DIAGNOSIS — B351 Tinea unguium: Secondary | ICD-10-CM

## 2023-10-26 DIAGNOSIS — L84 Corns and callosities: Secondary | ICD-10-CM | POA: Diagnosis not present

## 2023-10-28 NOTE — Progress Notes (Signed)
 Subjective: Chief Complaint  Patient presents with   [facility]    RM#[id number] DFC     89 year old female presents the office today for thick, elongated toenails that she is not able to trim herself as well as for preulcerative painful callus on the lateral aspect the right foot.  She gets tenderness mostly when she puts pressure on the area at nighttime or during the day.  No open lesions or any drainage.  Objective: AAO x3, NAD-daughter present DP/PT pulses palpable 1/4 bilaterally, CRT less than 3 seconds Nails are hypertrophic, dystrophic, brittle, discolored, elongated 10. No surrounding redness or drainage. Tenderness nails 1-5 bilaterally.   Hyperkeratotic lesion noted fifth metatarsal base right foot and left foot and bilateral heel however after debridement there is no underlying ulceration drainage or any signs of infection noted today.   No pain with calf compression, swelling, warmth, erythema  Assessment: 89 year old female with symptomatic onychomycosis, preulcerative callus right foot  Plan: -All treatment options discussed with the patient including all alternatives, risks, complications.  -Sharply debrided nails x 10 without any complications or bleeding. -Sharp debrided hyperkeratotic lesions x 4 without any complications or bleeding.  Discussed moisturizer, offloading.  No ulcerations this time but continue to monitor.  I put her heel pillows for her but they have not yet arrived. -Daily foot inspection.  Return in about 9 weeks (around 12/28/2023).  Vivi Barrack DPM

## 2023-11-01 ENCOUNTER — Ambulatory Visit: Payer: 59 | Admitting: Podiatry

## 2023-11-23 ENCOUNTER — Encounter: Payer: Self-pay | Admitting: Cardiovascular Disease

## 2023-11-23 ENCOUNTER — Ambulatory Visit: Payer: 59 | Attending: Cardiovascular Disease | Admitting: Cardiovascular Disease

## 2023-11-23 VITALS — BP 154/62 | HR 66 | Ht 67.0 in | Wt 131.0 lb

## 2023-11-23 DIAGNOSIS — I70229 Atherosclerosis of native arteries of extremities with rest pain, unspecified extremity: Secondary | ICD-10-CM

## 2023-11-23 DIAGNOSIS — I2510 Atherosclerotic heart disease of native coronary artery without angina pectoris: Secondary | ICD-10-CM | POA: Diagnosis not present

## 2023-11-23 DIAGNOSIS — Z9889 Other specified postprocedural states: Secondary | ICD-10-CM

## 2023-11-23 DIAGNOSIS — E782 Mixed hyperlipidemia: Secondary | ICD-10-CM

## 2023-11-23 DIAGNOSIS — I10 Essential (primary) hypertension: Secondary | ICD-10-CM | POA: Diagnosis not present

## 2023-11-23 NOTE — Progress Notes (Signed)
 11/23/2023 [name]   Nov 23, 1930  [id number]  Primary Physician Sierra Brand, DO Primary Cardiologist: Sierra Leigh MD Bennye Bravo, [place]  HPI:  [name] is a 89 y.o.    thin appearing widowed African-American female mother of 1 child, grandmother of 8 grandchildren is accompanied by her daughter [name] today.[name]  She was referred by Dr. Clydia Fuentes, her podiatrist, for right heel wound.    I last saw her in the office 05/19/2023.  She did do housework in her younger years.  Risk factors include treated hypertension, diabetes and hyperlipidemia.  Her father died of a myocardial infarction in his 68s.  She is never had a heart attack or stroke.  She denies chest pain or shortness of breath.  She has had some discoloration of her right heel over the last 3 to 4 months and did see Dr. Clydia Fuentes for this.  He did in office ABIs revealing a left ABI of 0.93 and on the right that was noncompressible.  She denies claudication.  She does have some pain in the right heel at night.   She  was hospitalized on August 18 with dizziness.  Her workup was unrevealing.  Her troponins were low and flat.  2D echo was essentially normal.  She had an elevated coronary calcium  score of greater than 3000 and a CTA FFR that revealed significant disease in the mid RCA although because of her age and absence of symptoms and invasive approach was not pursued.  She does live alone and is fairly independent.  She walks with a walker but does not drive.  Since I saw her 6 months ago she has remained stable.  She does complain of symptoms compatible with diabetic peripheral neuropathy.  She specifically denies chest pain or shortness of breath.  Current Meds  Medication Sig   aspirin  EC 81 MG tablet Take 1 tablet (81 mg total) by mouth daily. Swallow whole.   atorvastatin  (LIPITOR) 20 MG tablet TAKE 1 TABLET(20 MG) BY MOUTH DAILY   memantine (NAMENDA) 10 MG tablet Take 10 mg by mouth 2 (two) times daily.    Propylene Glycol (SYSTANE BALANCE OP) Place 1 drop into both eyes daily as needed (for dry eye).   QUEtiapine (SEROQUEL) 25 MG tablet Take 25 mg by mouth at bedtime.     No Known Allergies  Social History   Socioeconomic History   Marital status: Widowed    Spouse name: Not on file   Number of children: Not on file   Years of education: Not on file   Highest education level: Not on file  Occupational History   Not on file  Tobacco Use   Smoking status: Never   Smokeless tobacco: Never  Substance and Sexual Activity   Alcohol use: No   Drug use: No   Sexual activity: Not on file  Other Topics Concern   Not on file  Social History Narrative   Pt lives alone home.    Social Drivers of Corporate investment banker Strain: Not on file  Food Insecurity: No Food Insecurity (03/22/2023)   Hunger Vital Sign    Worried About Running Out of Food in the Last Year: Never true    Ran Out of Food in the Last Year: Never true  Transportation Needs: No Transportation Needs (03/22/2023)   PRAPARE - Administrator, Civil Service (Medical): No    Lack of Transportation (Non-Medical): No  Physical Activity: Not on file  Stress: Not on file  Social Connections: Not on file  Intimate Partner Violence: Not on file     Review of Systems: General: negative for chills, fever, night sweats or weight changes.  Cardiovascular: negative for chest pain, dyspnea on exertion, edema, orthopnea, palpitations, paroxysmal nocturnal dyspnea or shortness of breath Dermatological: negative for rash Respiratory: negative for cough or wheezing Urologic: negative for hematuria Abdominal: negative for nausea, vomiting, diarrhea, bright red blood per rectum, melena, or hematemesis Neurologic: negative for visual changes, syncope, or dizziness All other systems reviewed and are otherwise negative except as noted above.    Blood pressure (!) 154/62, pulse 66, height 5\' 7"  (1.702 m), weight 131 lb  (59.4 kg), SpO2 97%.  General appearance: alert and no distress Neck: no adenopathy, no carotid bruit, no JVD, supple, symmetrical, trachea midline, and thyroid not enlarged, symmetric, no tenderness/mass/nodules Lungs: clear to auscultation bilaterally Heart: regular rate and rhythm, S1, S2 normal, no murmur, click, rub or gallop Extremities: extremities normal, atraumatic, no cyanosis or edema Pulses: 2+ and symmetric Skin: Skin color, texture, turgor normal. No rashes or lesions Neurologic: Grossly normal  EKG EKG Interpretation Date/Time:  Tuesday November 23 2023 15:40:03 EDT Ventricular Rate:  66 PR Interval:  140 QRS Duration:  76 QT Interval:  390 QTC Calculation: 408 R Axis:   31  Text Interpretation: Sinus rhythm with marked sinus arrhythmia Nonspecific T wave abnormality When compared with ECG of 07-Apr-2023 10:25, Premature atrial complexes are no longer Present Non-specific change in ST segment in Lateral leads T wave inversion no longer evident in Inferior leads T wave inversion no longer evident in Lateral leads Confirmed by Lauro Portal [phone] on 11/23/2023 3:46:27 PM    ASSESSMENT AND PLAN:   Essential hypertension History of essential hypertension her blood pressure measured today at 154/62.  She is on metoprolol .  Hyperlipidemia History of hyperlipidemia on statin therapy with lipid profile performed 05/25/2023 revealing total cholesterol 135, LDL of 66 and HDL 49.  Coronary artery disease History of CAD with coronary calcium  score of 3000 by CTA FFR.  This revealed significant disease in the RCA although because of her age and absence of symptoms it was decided not to pursue an invasive approach.  She is currently asymptomatic.     Sierra Leigh MD FACP,FACC,FAHA, [facility] 11/23/2023 4:27 PM

## 2023-11-23 NOTE — Assessment & Plan Note (Signed)
 History of CAD with coronary calcium  score of 3000 by CTA FFR.  This revealed significant disease in the RCA although because of her age and absence of symptoms it was decided not to pursue an invasive approach.  She is currently asymptomatic.

## 2023-11-23 NOTE — Assessment & Plan Note (Signed)
 History of essential hypertension her blood pressure measured today at 154/62.  She is on metoprolol .

## 2023-11-23 NOTE — Assessment & Plan Note (Signed)
 History of hyperlipidemia on statin therapy with lipid profile performed 05/25/2023 revealing total cholesterol 135, LDL of 66 and HDL 49.

## 2023-11-23 NOTE — Patient Instructions (Signed)
 Medication Instructions:  Your physician recommends that you continue on your current medications as directed. Please refer to the Current Medication list given to you today.  *If you need a refill on your cardiac medications before your next appointment, please call your pharmacy*   Follow-Up: At [facility], you and your health needs are our priority.  As part of our continuing mission to provide you with exceptional heart care, our providers are all part of one team.  This team includes your primary Cardiologist (physician) and Advanced Practice Providers or APPs (Physician Assistants and Nurse Practitioners) who all work together to provide you with the care you need, when you need it.  Your next appointment:   12 month(s)  Provider:   Nanetta Batty, MD     We recommend signing up for the patient portal called "MyChart".  Sign up information is provided on this After Visit Summary.  MyChart is used to connect with patients for Virtual Visits (Telemedicine).  Patients are able to view lab/test results, encounter notes, upcoming appointments, etc.  Non-urgent messages can be sent to your provider as well.   To learn more about what you can do with MyChart, go to [email or web address].   Other Instructions       1st Floor: - Lobby - Registration  - Pharmacy  - Lab - Cafe  2nd Floor: - PV Lab - Diagnostic Testing (echo, CT, nuclear med)  3rd Floor: - Vacant  4th Floor: - TCTS (cardiothoracic surgery) - AFib Clinic - Structural Heart Clinic - Vascular Surgery  - Vascular Ultrasound  5th Floor: - HeartCare Cardiology (general and EP) - Clinical Pharmacy for coumadin, hypertension, lipid, weight-loss medications, and med management appointments    Valet parking services will be available as well.

## 2023-12-06 DIAGNOSIS — N1831 Chronic kidney disease, stage 3a: Secondary | ICD-10-CM | POA: Diagnosis not present

## 2023-12-06 DIAGNOSIS — E785 Hyperlipidemia, unspecified: Secondary | ICD-10-CM | POA: Diagnosis not present

## 2023-12-06 DIAGNOSIS — E1143 Type 2 diabetes mellitus with diabetic autonomic (poly)neuropathy: Secondary | ICD-10-CM | POA: Diagnosis not present

## 2023-12-06 DIAGNOSIS — E1122 Type 2 diabetes mellitus with diabetic chronic kidney disease: Secondary | ICD-10-CM | POA: Diagnosis not present

## 2023-12-06 DIAGNOSIS — I129 Hypertensive chronic kidney disease with stage 1 through stage 4 chronic kidney disease, or unspecified chronic kidney disease: Secondary | ICD-10-CM | POA: Diagnosis not present

## 2023-12-13 DIAGNOSIS — I129 Hypertensive chronic kidney disease with stage 1 through stage 4 chronic kidney disease, or unspecified chronic kidney disease: Secondary | ICD-10-CM | POA: Diagnosis not present

## 2023-12-13 DIAGNOSIS — N1831 Chronic kidney disease, stage 3a: Secondary | ICD-10-CM | POA: Diagnosis not present

## 2023-12-13 DIAGNOSIS — R7989 Other specified abnormal findings of blood chemistry: Secondary | ICD-10-CM | POA: Diagnosis not present

## 2023-12-13 DIAGNOSIS — E1122 Type 2 diabetes mellitus with diabetic chronic kidney disease: Secondary | ICD-10-CM | POA: Diagnosis not present

## 2023-12-13 DIAGNOSIS — E1143 Type 2 diabetes mellitus with diabetic autonomic (poly)neuropathy: Secondary | ICD-10-CM | POA: Diagnosis not present

## 2023-12-13 DIAGNOSIS — E785 Hyperlipidemia, unspecified: Secondary | ICD-10-CM | POA: Diagnosis not present

## 2023-12-28 ENCOUNTER — Ambulatory Visit (INDEPENDENT_AMBULATORY_CARE_PROVIDER_SITE_OTHER): Admitting: Podiatry

## 2023-12-28 ENCOUNTER — Encounter: Payer: Self-pay | Admitting: Podiatry

## 2023-12-28 DIAGNOSIS — M79675 Pain in left toe(s): Secondary | ICD-10-CM | POA: Diagnosis not present

## 2023-12-28 DIAGNOSIS — I739 Peripheral vascular disease, unspecified: Secondary | ICD-10-CM

## 2023-12-28 DIAGNOSIS — M79674 Pain in right toe(s): Secondary | ICD-10-CM | POA: Diagnosis not present

## 2023-12-28 DIAGNOSIS — E1149 Type 2 diabetes mellitus with other diabetic neurological complication: Secondary | ICD-10-CM

## 2023-12-28 DIAGNOSIS — L84 Corns and callosities: Secondary | ICD-10-CM | POA: Diagnosis not present

## 2023-12-28 DIAGNOSIS — B351 Tinea unguium: Secondary | ICD-10-CM

## 2023-12-31 NOTE — Progress Notes (Signed)
 Subjective: Chief Complaint  Patient presents with   [facility]    RM#[id number] [facility] patient has no concern at this time.     89 year old female presents the office today for thick, elongated toenails that she is not able to trim herself as well as for preulcerative painful callus on the lateral aspect the right foot.  Previously dispensed heel pillows to help offload which she has not been wearing.  Denies any open sores.  No swelling, redness or any drainage.    Objective: AAO x3, NAD-daughter present DP/PT pulses palpable 1/4 bilaterally, CRT less than 3 seconds Nails are hypertrophic, dystrophic, brittle, discolored, elongated 10. No surrounding redness or drainage. Tenderness nails 1-5 bilaterally.   Hyperkeratotic lesion noted fifth metatarsal base right foot and left foot and bilateral heel however after debridement there is no underlying ulceration drainage or any signs of infection noted today.  No other open lesions or new preulcerative calluses. No pain with calf compression, swelling, warmth, erythema  Assessment: 89 year old female with symptomatic onychomycosis, preulcerative callus right foot  Plan: -All treatment options discussed with the patient including all alternatives, risks, complications.  -Sharply debrided nails x 10 without any complications or bleeding. -Sharp debrided hyperkeratotic lesions x 4 without any complications or bleeding.  Discussed moisturizer, offloading.  No ulcerations this time but continue to monitor.  Continue offloading. -Daily foot inspection.  Return in about 9 weeks (around 02/29/2024) for nail/callus trim.   Charity Conch DPM

## 2024-01-05 DIAGNOSIS — R531 Weakness: Secondary | ICD-10-CM | POA: Diagnosis not present

## 2024-01-05 DIAGNOSIS — R0602 Shortness of breath: Secondary | ICD-10-CM | POA: Diagnosis not present

## 2024-01-05 DIAGNOSIS — R0989 Other specified symptoms and signs involving the circulatory and respiratory systems: Secondary | ICD-10-CM | POA: Diagnosis not present

## 2024-02-29 ENCOUNTER — Ambulatory Visit (INDEPENDENT_AMBULATORY_CARE_PROVIDER_SITE_OTHER): Admitting: Podiatry

## 2024-02-29 ENCOUNTER — Encounter: Payer: Self-pay | Admitting: Podiatry

## 2024-02-29 DIAGNOSIS — E1149 Type 2 diabetes mellitus with other diabetic neurological complication: Secondary | ICD-10-CM

## 2024-02-29 DIAGNOSIS — I739 Peripheral vascular disease, unspecified: Secondary | ICD-10-CM

## 2024-02-29 DIAGNOSIS — B351 Tinea unguium: Secondary | ICD-10-CM

## 2024-02-29 DIAGNOSIS — M79674 Pain in right toe(s): Secondary | ICD-10-CM

## 2024-02-29 DIAGNOSIS — L84 Corns and callosities: Secondary | ICD-10-CM | POA: Diagnosis not present

## 2024-02-29 DIAGNOSIS — M79675 Pain in left toe(s): Secondary | ICD-10-CM | POA: Diagnosis not present

## 2024-02-29 NOTE — Progress Notes (Unsigned)
 Subjective: Chief Complaint  Patient presents with   Diabetes    DFC NIDDM A1C 6.9. Toenail trim.      89 year old female presents the office today for thick, elongated toenails that she is not able to trim herself as well as for preulcerative, painful callus on the lateral aspect the feet and heels.  She states they do feel better after they are trimmed.  No open lesions, drainage noted. No new concerns.   Objective: AAO x3, NAD-daughter present DP/PT pulses palpable 1/4 bilaterally, CRT less than 3 seconds Nails are hypertrophic, dystrophic, brittle, discolored, elongated 10. No surrounding redness or drainage. Tenderness nails 1-5 bilaterally.   Hyperkeratotic lesions bilateral fifth metatarsal base as well as posterior lateral heels bilaterally.  There is no underlying ulceration, drainage or signs of infection. No pain with calf compression, swelling, warmth, erythema  Assessment: 89 year old female with symptomatic onychomycosis, preulcerative calluses  Plan: -All treatment options discussed with the patient including all alternatives, risks, complications.  -Sharply debrided nails x 10 without any complications or bleeding. -Sharp debrided hyperkeratotic lesions x 4 without any complications or bleeding.  Discussed moisturizer, offloading.  No ulcerations this time but continue to monitor.  Continue offloading.  She been using Vaseline which has been helpful. -Daily foot inspection.  Return in about 2 months (around 05/01/2024).   [name] Fees DPM

## 2024-04-27 DIAGNOSIS — N1831 Chronic kidney disease, stage 3a: Secondary | ICD-10-CM | POA: Diagnosis not present

## 2024-04-27 DIAGNOSIS — E1143 Type 2 diabetes mellitus with diabetic autonomic (poly)neuropathy: Secondary | ICD-10-CM | POA: Diagnosis not present

## 2024-04-27 DIAGNOSIS — I2510 Atherosclerotic heart disease of native coronary artery without angina pectoris: Secondary | ICD-10-CM | POA: Diagnosis not present

## 2024-04-27 DIAGNOSIS — E1122 Type 2 diabetes mellitus with diabetic chronic kidney disease: Secondary | ICD-10-CM | POA: Diagnosis not present

## 2024-04-27 DIAGNOSIS — I129 Hypertensive chronic kidney disease with stage 1 through stage 4 chronic kidney disease, or unspecified chronic kidney disease: Secondary | ICD-10-CM | POA: Diagnosis not present

## 2024-04-27 DIAGNOSIS — R7989 Other specified abnormal findings of blood chemistry: Secondary | ICD-10-CM | POA: Diagnosis not present

## 2024-04-27 DIAGNOSIS — G309 Alzheimer's disease, unspecified: Secondary | ICD-10-CM | POA: Diagnosis not present

## 2024-05-01 ENCOUNTER — Ambulatory Visit: Admitting: Podiatry

## 2024-07-24 ENCOUNTER — Telehealth: Payer: Self-pay | Admitting: Orthopedic Surgery

## 2024-07-24 ENCOUNTER — Ambulatory Visit: Admitting: Podiatry

## 2024-07-24 ENCOUNTER — Ambulatory Visit: Admitting: Orthopedic Surgery

## 2024-07-24 NOTE — Telephone Encounter (Signed)
 Pt's daughter called asking if pt can come in alittle later today. Pt moving slow. Explained to pt's daughter we have no opening later today and might have to r/s for another day. Please call [name] at [phone]. If pt can come later today

## 2024-07-30 ENCOUNTER — Encounter (HOSPITAL_COMMUNITY): Payer: Self-pay | Admitting: *Deleted

## 2024-07-30 ENCOUNTER — Other Ambulatory Visit: Payer: Self-pay

## 2024-07-30 ENCOUNTER — Ambulatory Visit (HOSPITAL_COMMUNITY)
Admission: EM | Admit: 2024-07-30 | Discharge: 2024-07-30 | Disposition: A | Discharge status: Home / Self Care | Attending: Nurse Practitioner | Admitting: Nurse Practitioner

## 2024-07-30 ENCOUNTER — Ambulatory Visit (HOSPITAL_COMMUNITY)

## 2024-07-30 DIAGNOSIS — R051 Acute cough: Secondary | ICD-10-CM

## 2024-07-30 DIAGNOSIS — J189 Pneumonia, unspecified organism: Secondary | ICD-10-CM | POA: Insufficient documentation

## 2024-07-30 LAB — COMPREHENSIVE METABOLIC PANEL WITH GFR
ALT: 23 U/L (ref 0–44)
AST: 50 U/L — ABNORMAL HIGH (ref 15–41)
Albumin: 3.6 g/dL (ref 3.5–5.0)
Alkaline Phosphatase: 58 U/L (ref 38–126)
Anion gap: 10 (ref 5–15)
BUN: 17 mg/dL (ref 8–23)
CO2: 26 mmol/L (ref 22–32)
Calcium: 9.4 mg/dL (ref 8.9–10.3)
Chloride: 105 mmol/L (ref 98–111)
Creatinine, Ser: 0.96 mg/dL (ref 0.44–1.00)
GFR, Estimated: 55 mL/min — ABNORMAL LOW
Glucose, Bld: 104 mg/dL — ABNORMAL HIGH (ref 70–99)
Potassium: 4.5 mmol/L (ref 3.5–5.1)
Sodium: 141 mmol/L (ref 135–145)
Total Bilirubin: 0.7 mg/dL (ref 0.0–1.2)
Total Protein: 6.5 g/dL (ref 6.5–8.1)

## 2024-07-30 LAB — CBC WITH DIFFERENTIAL/PLATELET
Abs Immature Granulocytes: 0.03 K/uL (ref 0.00–0.07)
Basophils Absolute: 0 K/uL (ref 0.0–0.1)
Basophils Relative: 0 %
Eosinophils Absolute: 0 K/uL (ref 0.0–0.5)
Eosinophils Relative: 0 %
HCT: 41.4 % (ref 36.0–46.0)
Hemoglobin: 14.1 g/dL (ref 12.0–15.0)
Immature Granulocytes: 0 %
Lymphocytes Relative: 10 %
Lymphs Abs: 0.8 K/uL (ref 0.7–4.0)
MCH: 27.5 pg (ref 26.0–34.0)
MCHC: 34.1 g/dL (ref 30.0–36.0)
MCV: 80.7 fL (ref 80.0–100.0)
Monocytes Absolute: 0.3 K/uL (ref 0.1–1.0)
Monocytes Relative: 4 %
Neutro Abs: 6.5 K/uL (ref 1.7–7.7)
Neutrophils Relative %: 86 %
Platelets: 71 K/uL — ABNORMAL LOW (ref 150–400)
RBC: 5.13 MIL/uL — ABNORMAL HIGH (ref 3.87–5.11)
RDW: 16.8 % — ABNORMAL HIGH (ref 11.5–15.5)
WBC: 7.6 K/uL (ref 4.0–10.5)
nRBC: 0 % (ref 0.0–0.2)

## 2024-07-30 LAB — POC COVID19/FLU A&B COMBO
Covid Antigen, POC: NEGATIVE
Influenza A Antigen, POC: NEGATIVE
Influenza B Antigen, POC: NEGATIVE

## 2024-07-30 MED ORDER — AMOXICILLIN-POT CLAVULANATE 500-125 MG PO TABS: 1.0000 | ORAL_TABLET | Freq: Two times a day (BID) | ORAL | 0 refills | Status: DC

## 2024-07-30 MED ORDER — DOXYCYCLINE HYCLATE 100 MG PO CAPS: 100.0000 mg | ORAL_CAPSULE | Freq: Two times a day (BID) | ORAL | 0 refills | Status: DC

## 2024-07-30 MED ORDER — BENZONATATE 100 MG PO CAPS: 100.0000 mg | ORAL_CAPSULE | Freq: Three times a day (TID) | ORAL | 0 refills | Status: AC

## 2024-07-30 NOTE — ED Triage Notes (Signed)
 PT reports she started coughing last night and did not sleep very well. Pt also reports she gets hoarse in a minute.

## 2024-07-30 NOTE — Discharge Instructions (Signed)
 She has a pneumonia.  As we discussed, this can be very dangerous in the elderly.  Please stay with her and if she has any changing symptoms including confusion, weakness, shortness of breath, chest pain, fever she needs to go to the emergency room immediately.  Start Augmentin  and doxycycline  as prescribed.  Use Tessalon  to help with cough.  You can use over-the-counter medication including Mucinex, Flonase, nasal saline/sinus rinses.  It is very important that she is reevaluated within 24 hours.  If you cannot see her primary care in this timeframe return here or if you have any concerns for worsening symptoms please go to the emergency room as we discussed.  I will contact you if your blood work is abnormal and we need to adjust her doses or change her treatment plan.

## 2024-07-30 NOTE — ED Provider Notes (Signed)
 " [facility]    CSN: [id number] Arrival date & time: 07/30/24  1630      History   Chief Complaint Chief Complaint  Patient presents with   Cough    HPI [name] is a 89 y.o. female.   Patient presents today accompanied by her daughter who help provide the majority of history.  Her daughter is her primary caregiver and lives in the same home.  Reports a 3-day history of worsening cough.  She has had some mild congestion but denies additional symptoms including fever, shortness of breath, chest pain, nausea, vomiting.  She has been getting Mucinex, Tylenol , cold and flu medication without improvement of symptoms.  Denies any history of chronic lung condition including asthma or COPD.  She does not smoke.  Denies any known sick contacts.  She reports the cough is preventing her from sleeping and she is not experiencing hoarseness because of the cough.  She has a history of diabetes but reports her blood sugars are well-controlled her last A1c was 6.9% August 2024.  She denies any recent antibiotics or steroids.    Past Medical History:  Diagnosis Date   Acid reflux    Arthritis    Dementia (HCC)    Diabetes mellitus without complication (HCC)    High cholesterol    Hypertension     Patient Active Problem List   Diagnosis Date Noted   Neurodegenerative cognitive impairment 10/14/2023   Coronary artery disease 05/19/2023   Dizziness 03/21/2023   QT prolongation 03/21/2023   Normocytic anemia 03/21/2023   Dementia (HCC) 03/21/2023   Type 2 diabetes mellitus (HCC) 03/21/2023   Abnormal gait 05/19/2021   Amnesia 05/19/2021   Cervical disc disorder 05/19/2021   Diabetic peripheral neuropathy associated with type 2 diabetes mellitus (HCC) 05/19/2021   Diabetic renal disease (HCC) 05/19/2021   Dyslipidemia 05/19/2021   Localized, primary osteoarthritis of shoulder region 05/19/2021   Recurrent falls 05/19/2021   Essential hypertension 07/16/2020    Hyperlipidemia 07/16/2020   Critical limb ischemia with history of revascularization of same extremity (HCC) 07/16/2020    Past Surgical History:  Procedure Laterality Date   ABDOMINAL HYSTERECTOMY     APPENDECTOMY     CATARACT EXTRACTION, BILATERAL     CHOLECYSTECTOMY     HEMORRHOID SURGERY     TONSILLECTOMY      OB History   No obstetric history on file.      Home Medications    Prior to Admission medications  Medication Sig Start Date End Date Taking? Authorizing Provider  amoxicillin -clavulanate (AUGMENTIN ) 500-125 MG tablet Take 1 tablet by mouth in the morning and at bedtime for 10 days. 07/30/24 08/09/24 Yes Colen Eltzroth K, PA-C  benzonatate  (TESSALON ) 100 MG capsule Take 1 capsule (100 mg total) by mouth every 8 (eight) hours. 07/30/24  Yes Trevel Dillenbeck K, PA-C  doxycycline  (VIBRAMYCIN ) 100 MG capsule Take 1 capsule (100 mg total) by mouth 2 (two) times daily. 07/30/24  Yes Jeriah Skufca, Rocky POUR, PA-C  aspirin  EC 81 MG tablet Take 1 tablet (81 mg total) by mouth daily. Swallow whole. 04/07/23   West, Katlyn D, NP  atorvastatin  (LIPITOR) 20 MG tablet TAKE 1 TABLET(20 MG) BY MOUTH DAILY 07/06/23   Court Dorn PARAS, MD  memantine (NAMENDA) 10 MG tablet Take 10 mg by mouth 2 (two) times daily. 09/27/23   [provider]  metoprolol  succinate (TOPROL -XL) 25 MG 24 hr tablet Take 0.5 tablets (12.5 mg total) by mouth daily. 04/07/23 02/29/24  West, Katlyn D, NP  Propylene Glycol (SYSTANE BALANCE OP) Place 1 drop into both eyes daily as needed (for dry eye).    [provider]  QUEtiapine (SEROQUEL) 25 MG tablet Take 25 mg by mouth at bedtime. 09/28/23   [provider]    Family History History reviewed. No pertinent family history.  Social History Social History[1]   Allergies   Patient has no known allergies.   Review of Systems Review of Systems  Constitutional:  Positive for activity change. Negative for appetite change, fatigue and fever.  HENT:   Positive for congestion. Negative for sinus pressure, sneezing and sore throat.   Respiratory:  Positive for cough. Negative for shortness of breath.   Cardiovascular:  Negative for chest pain.  Gastrointestinal:  Negative for abdominal pain, diarrhea, nausea and vomiting.  Neurological:  Negative for dizziness, light-headedness and headaches.     Physical Exam Triage Vital Signs ED Triage Vitals  Encounter Vitals Group     BP 07/30/24 1907 (!) 160/71     Girls Systolic BP Percentile --      Girls Diastolic BP Percentile --      Boys Systolic BP Percentile --      Boys Diastolic BP Percentile --      Pulse Rate 07/30/24 1907 69     Resp 07/30/24 1907 20     Temp 07/30/24 1907 99.3 F (37.4 C)     Temp src --      SpO2 07/30/24 1907 96 %     Weight --      Height --      Head Circumference --      Peak Flow --      Pain Score 07/30/24 1903 0     Pain Loc --      Pain Education --      Exclude from Growth Chart --    No data found.  Updated Vital Signs BP (!) 160/71   Pulse 69   Temp 99.3 F (37.4 C)   Resp 20   SpO2 96%   Visual Acuity Right Eye Distance:   Left Eye Distance:   Bilateral Distance:    Right Eye Near:   Left Eye Near:    Bilateral Near:     Physical Exam Vitals reviewed.  Constitutional:      General: She is awake. She is not in acute distress.    Appearance: Normal appearance. She is well-developed. She is not ill-appearing.     Comments: Very pleasant female appears noted age sitting in a wheelchair in no acute distress  HENT:     Head: Normocephalic and atraumatic.     Right Ear: Tympanic membrane, ear canal and external ear normal. Tympanic membrane is not erythematous or bulging.     Left Ear: Tympanic membrane, ear canal and external ear normal. Tympanic membrane is not erythematous or bulging.     Nose:     Right Sinus: No maxillary sinus tenderness or frontal sinus tenderness.     Left Sinus: No maxillary sinus tenderness or frontal  sinus tenderness.     Mouth/Throat:     Dentition: Abnormal dentition.     Pharynx: Uvula midline. No oropharyngeal exudate or posterior oropharyngeal erythema.  Cardiovascular:     Rate and Rhythm: Normal rate and regular rhythm.     Heart sounds: Normal heart sounds, S1 normal and S2 normal. No murmur heard. Pulmonary:     Effort: Pulmonary effort is normal.  Breath sounds: Examination of the right-lower field reveals rales. Examination of the left-lower field reveals rales. Rales present. No wheezing or rhonchi.     Comments: Bilateral rales Psychiatric:        Behavior: Behavior is cooperative.      UC Treatments / Results  Labs (all labs ordered are listed, but only abnormal results are displayed) Labs Reviewed  CBC WITH DIFFERENTIAL/PLATELET  COMPREHENSIVE METABOLIC PANEL WITH GFR  POC COVID19/FLU A&B COMBO    EKG   Radiology DG Chest 2 View Result Date: 07/30/2024 EXAM: 2 VIEW(S) XRAY OF THE CHEST 07/30/2024 07:55:06 PM COMPARISON: 03/21/2023 chest x-ray, CT angio chest 8:19:24 CLINICAL HISTORY: cough and chest congestion FINDINGS: LUNGS AND PLEURA: Lung hyperinflation. Possible right lower lobe airspace opacity. Chronic coarsened interstitial markings. Possible trace right pleural effusion. No pneumothorax. HEART AND MEDIASTINUM: Atherosclerotic plaque. No acute abnormality of the cardiac and mediastinal silhouettes. BONES AND SOFT TISSUES: No acute osseous abnormality. IMPRESSION: 1. Possible right lower lobe airspace opacity with trace right pleural effusion. 2. Lung hyperinflation. Electronically signed by: Morgane Naveau MD 07/30/2024 08:31 PM EST RP Workstation: [id number]    Procedures Procedures (including critical care time)  Medications Ordered in UC Medications - No data to display  Initial Impression / Assessment and Plan / UC Course  I have reviewed the triage vital signs and the nursing notes.  Pertinent labs & imaging results that were available  during my care of the patient were reviewed by me and considered in my medical decision making (see chart for details).     Patient is well-appearing, afebrile, nontoxic, nontachycardic, with an oxygen saturation of 96%.  COVID and flu testing were negative in clinic.  Chest x-ray was obtained that did show right lower lobe opacity concerning for pneumonia.  We did discuss that it is reasonable to go to to the hospital given her advanced age but since her vital signs are stable and she has a CURB-65 score of only 1 we will attempt outpatient treatment.  Based on her metabolic panel from 03/24/2023 and a creatinine of 1.18 with calculated creatinine clearance of 29 mL/min her dose was adjusted to 500 mg of amoxicillin  twice daily and will use doxycycline  100 mg twice daily for 10 days since she also has a history of QT prolongation is not a candidate for several of the other medications for CAP.  Basic blood work including CBC and CMP were obtained and if she has an AKI or significant leukocytosis she will need to go to the emergency room immediately.  We discussed that she should be evaluated within 24 hours to ensure that this is not worsening and for recheck vitals.  If she cannot see her primary care in this timeframe she is to return here and we discussed that the daughter who lives with her should monitor very closely for any changing symptoms and if anything does worsen and she has confusion, shortness of breath, fever, weakness she needs to go to the emergency room immediately.  Daughter expressed understanding and agreement with treatment plan.  All questions were answered to their satisfaction.  Final Clinical Impressions(s) / UC Diagnoses   Final diagnoses:  Acute cough  Pneumonia of right lower lobe due to infectious organism     Discharge Instructions      She has a pneumonia.  As we discussed, this can be very dangerous in the elderly.  Please stay with her and if she has any changing  symptoms including confusion, weakness, shortness  of breath, chest pain, fever she needs to go to the emergency room immediately.  Start Augmentin  and doxycycline  as prescribed.  Use Tessalon  to help with cough.  You can use over-the-counter medication including Mucinex, Flonase, nasal saline/sinus rinses.  It is very important that she is reevaluated within 24 hours.  If you cannot see her primary care in this timeframe return here or if you have any concerns for worsening symptoms please go to the emergency room as we discussed.  I will contact you if your blood work is abnormal and we need to adjust her doses or change her treatment plan.     ED Prescriptions     Medication Sig Dispense Auth. Provider   amoxicillin -clavulanate (AUGMENTIN ) 500-125 MG tablet Take 1 tablet by mouth in the morning and at bedtime for 10 days. 20 tablet Ary Lavine K, PA-C   doxycycline  (VIBRAMYCIN ) 100 MG capsule Take 1 capsule (100 mg total) by mouth 2 (two) times daily. 20 capsule Oluwaseun Bruyere K, PA-C   benzonatate  (TESSALON ) 100 MG capsule Take 1 capsule (100 mg total) by mouth every 8 (eight) hours. 21 capsule Adeliz Tonkinson K, PA-C      PDMP not reviewed this encounter.     [1]  Social History Tobacco Use   Smoking status: Never   Smokeless tobacco: Never  Substance Use Topics   Alcohol use: No   Drug use: No     Sherrell Rocky POUR, PA-C 07/30/24 2046  "

## 2024-07-31 ENCOUNTER — Ambulatory Visit: Admitting: Physician Assistant

## 2024-07-31 ENCOUNTER — Ambulatory Visit (HOSPITAL_COMMUNITY): Payer: Self-pay | Admitting: Physician Assistant

## 2024-08-03 NOTE — Telephone Encounter (Signed)
 Pt's daughter called back to further discuss results. Confirms pt is not having abnormal bleeding. Questions were answered to her satisfaction.

## 2024-08-04 ENCOUNTER — Encounter (HOSPITAL_COMMUNITY): Payer: Self-pay | Admitting: Emergency Medicine

## 2024-08-04 ENCOUNTER — Other Ambulatory Visit: Payer: Self-pay

## 2024-08-04 ENCOUNTER — Emergency Department (HOSPITAL_COMMUNITY)

## 2024-08-04 ENCOUNTER — Inpatient Hospital Stay (HOSPITAL_COMMUNITY)
Admission: EM | Admit: 2024-08-04 | Discharge: 2024-08-07 | DRG: 193 | Disposition: A | Discharge status: Home / Self Care | Attending: Family Medicine | Admitting: Family Medicine

## 2024-08-04 DIAGNOSIS — Z1152 Encounter for screening for COVID-19: Secondary | ICD-10-CM

## 2024-08-04 DIAGNOSIS — J188 Other pneumonia, unspecified organism: Secondary | ICD-10-CM | POA: Diagnosis present

## 2024-08-04 DIAGNOSIS — I4581 Long QT syndrome: Secondary | ICD-10-CM | POA: Diagnosis present

## 2024-08-04 DIAGNOSIS — Z79899 Other long term (current) drug therapy: Secondary | ICD-10-CM

## 2024-08-04 DIAGNOSIS — Z90710 Acquired absence of both cervix and uterus: Secondary | ICD-10-CM

## 2024-08-04 DIAGNOSIS — I2489 Other forms of acute ischemic heart disease: Secondary | ICD-10-CM | POA: Diagnosis present

## 2024-08-04 DIAGNOSIS — I5033 Acute on chronic diastolic (congestive) heart failure: Secondary | ICD-10-CM | POA: Diagnosis present

## 2024-08-04 DIAGNOSIS — I509 Heart failure, unspecified: Secondary | ICD-10-CM

## 2024-08-04 DIAGNOSIS — J189 Pneumonia, unspecified organism: Principal | ICD-10-CM | POA: Diagnosis present

## 2024-08-04 DIAGNOSIS — I160 Hypertensive urgency: Secondary | ICD-10-CM | POA: Diagnosis present

## 2024-08-04 DIAGNOSIS — I3139 Other pericardial effusion (noninflammatory): Secondary | ICD-10-CM | POA: Diagnosis present

## 2024-08-04 DIAGNOSIS — E876 Hypokalemia: Secondary | ICD-10-CM | POA: Diagnosis present

## 2024-08-04 DIAGNOSIS — K219 Gastro-esophageal reflux disease without esophagitis: Secondary | ICD-10-CM | POA: Diagnosis present

## 2024-08-04 DIAGNOSIS — I110 Hypertensive heart disease with heart failure: Secondary | ICD-10-CM | POA: Diagnosis present

## 2024-08-04 DIAGNOSIS — F0390 Unspecified dementia without behavioral disturbance: Secondary | ICD-10-CM | POA: Diagnosis present

## 2024-08-04 DIAGNOSIS — E1151 Type 2 diabetes mellitus with diabetic peripheral angiopathy without gangrene: Secondary | ICD-10-CM | POA: Diagnosis present

## 2024-08-04 DIAGNOSIS — E7800 Pure hypercholesterolemia, unspecified: Secondary | ICD-10-CM | POA: Diagnosis present

## 2024-08-04 DIAGNOSIS — Z7982 Long term (current) use of aspirin: Secondary | ICD-10-CM

## 2024-08-04 DIAGNOSIS — R7989 Other specified abnormal findings of blood chemistry: Secondary | ICD-10-CM

## 2024-08-04 DIAGNOSIS — R9431 Abnormal electrocardiogram [ECG] [EKG]: Secondary | ICD-10-CM | POA: Diagnosis present

## 2024-08-04 DIAGNOSIS — I2510 Atherosclerotic heart disease of native coronary artery without angina pectoris: Secondary | ICD-10-CM | POA: Diagnosis present

## 2024-08-04 MED ORDER — HYDRALAZINE HCL 25 MG PO TABS
25.0000 mg | ORAL_TABLET | Freq: Once | ORAL | Status: AC
  Administered 2024-08-04: 25 mg via ORAL
  Filled 2024-08-04: qty 1

## 2024-08-04 NOTE — ED Triage Notes (Addendum)
 BIB [facility] after going to UC on 12/28 and was dx with pneumonia, was given abx- augmentin  and doxy. Was encouraged to f/u with PCP and cannot get in to see until next week, so pt is f/u here. She is hypertensive per EMS.   BP 230/98 HR 60 Spo2 100% CBG 154 97.3 Temp temporal 18 LFA fluids NS

## 2024-08-04 NOTE — ED Provider Notes (Signed)
 " [place] EMERGENCY DEPARTMENT AT [facility] Provider Note   CSN: [id number] Arrival date & time: 08/04/24  2237     Patient presents with: Follow-up and Hypertension   [name] is a 89 y.o. female with history of dementia , QTc prolongation who was recently diagnosed with pneumonia at urgent care 2 days ago after presenting with 3 days of worsening cough and flulike symptoms started on renal dosing Augmentin  and Doxy and encouraged to follow-up with her PCP.  Patient states she does not know why EMS called and states I will have to talk to my crazy daughter.  She can provide no further insight into her presentation to the ED this evening.  Please see ED course note for further collateral history from patient's daughter.  Patient overall is well-appearing, states she still with mild cough but is not experiencing any chest pain or shortness of breath.  Denies any neurosymptoms.   HPI     Prior to Admission medications  Medication Sig Start Date End Date Taking? Authorizing Provider  amoxicillin -clavulanate (AUGMENTIN ) 500-125 MG tablet Take 1 tablet by mouth in the morning and at bedtime for 10 days. 07/30/24 08/09/24  Raspet, Rocky POUR, PA-C  aspirin  EC 81 MG tablet Take 1 tablet (81 mg total) by mouth daily. Swallow whole. 04/07/23   West, Katlyn D, NP  atorvastatin  (LIPITOR) 20 MG tablet TAKE 1 TABLET(20 MG) BY MOUTH DAILY 07/06/23   Court Dorn PARAS, MD  benzonatate  (TESSALON ) 100 MG capsule Take 1 capsule (100 mg total) by mouth every 8 (eight) hours. 07/30/24   Raspet, Erin K, PA-C  doxycycline  (VIBRAMYCIN ) 100 MG capsule Take 1 capsule (100 mg total) by mouth 2 (two) times daily. 07/30/24   Raspet, Rocky POUR, PA-C  memantine  (NAMENDA ) 10 MG tablet Take 10 mg by mouth 2 (two) times daily. 09/27/23   [provider]  metoprolol  succinate (TOPROL -XL) 25 MG 24 hr tablet Take 0.5 tablets (12.5 mg total) by mouth daily. 04/07/23 02/29/24  West, Katlyn D, NP  Propylene Glycol  (SYSTANE BALANCE OP) Place 1 drop into both eyes daily as needed (for dry eye).    [provider]  QUEtiapine  (SEROQUEL ) 25 MG tablet Take 25 mg by mouth at bedtime. 09/28/23   [provider]    Allergies: Patient has no known allergies.    Review of Systems  Unable to perform ROS: Dementia  Respiratory:  Positive for cough and shortness of breath.     Updated Vital Signs BP (!) 193/77   Pulse (!) 58   Temp 98.7 F (37.1 C) (Oral)   Resp 16   Ht 5' 7 (1.702 m)   Wt 59.4 kg   SpO2 100%   BMI 20.51 kg/m   Physical Exam Vitals and nursing note reviewed.  Constitutional:      Appearance: She is not ill-appearing or toxic-appearing.  HENT:     Head: Normocephalic and atraumatic.     Mouth/Throat:     Mouth: Mucous membranes are moist.     Pharynx: No oropharyngeal exudate or posterior oropharyngeal erythema.  Eyes:     General:        Right eye: No discharge.        Left eye: No discharge.     Conjunctiva/sclera: Conjunctivae normal.  Cardiovascular:     Rate and Rhythm: Normal rate and regular rhythm.     Pulses: Normal pulses.     Heart sounds: Murmur heard.  Pulmonary:  Effort: Pulmonary effort is normal. No tachypnea, accessory muscle usage or respiratory distress.     Breath sounds: No transmitted upper airway sounds. Examination of the left-upper field reveals rales. Examination of the left-middle field reveals rales. Rales present. No wheezing.  Abdominal:     General: Bowel sounds are normal. There is no distension.     Palpations: Abdomen is soft.     Tenderness: There is no abdominal tenderness. There is no guarding or rebound.  Musculoskeletal:        General: No deformity.     Cervical back: Neck supple.     Right lower leg: 1+ Edema present.     Left lower leg: 1+ Edema present.  Skin:    General: Skin is warm and dry.  Neurological:     Mental Status: She is alert. Mental status is at baseline.  Psychiatric:        Mood and  Affect: Mood normal.     (all labs ordered are listed, but only abnormal results are displayed) Labs Reviewed  CBC WITH DIFFERENTIAL/PLATELET - Abnormal; Notable for the following components:      Result Value   WBC 3.7 (*)    RDW 16.2 (*)    Neutro Abs 1.5 (*)    All other components within normal limits  PRO BRAIN NATRIURETIC PEPTIDE - Abnormal; Notable for the following components:   Pro Brain Natriuretic Peptide 3,010.0 (*)    All other components within normal limits  BASIC METABOLIC PANEL WITH GFR - Abnormal; Notable for the following components:   Potassium 2.8 (*)    Glucose, Bld 118 (*)    Calcium  8.8 (*)    All other components within normal limits  URINALYSIS, ROUTINE W REFLEX MICROSCOPIC - Abnormal; Notable for the following components:   Color, Urine STRAW (*)    Hgb urine dipstick SMALL (*)    All other components within normal limits  TROPONIN T, HIGH SENSITIVITY - Abnormal; Notable for the following components:   Troponin T High Sensitivity 25 (*)    All other components within normal limits  TROPONIN T, HIGH SENSITIVITY - Abnormal; Notable for the following components:   Troponin T High Sensitivity 26 (*)    All other components within normal limits  RESP PANEL BY RT-PCR (RSV, FLU A&B, COVID)  [id number]    EKG: None  Radiology: DG Chest 1 View Result Date: 08/05/2024 EXAM: 1 VIEW(S) XRAY OF THE CHEST 08/04/2024 11:32:00 PM COMPARISON: Comparison with 07/30/2024. CLINICAL HISTORY: Cough. FINDINGS: LUNGS AND PLEURA: Emphysematous changes in the lungs. Patchy areas of infiltration in both lungs. This may represent multifocal pneumonia. Chronic central interstitial changes are also suggested. No pleural effusion. No pneumothorax. HEART AND MEDIASTINUM: Mild cardiac enlargement. No vascular congestion. Mediastinal contours appear intact. BONES AND SOFT TISSUES: Degenerative changes in the spine and shoulders. No acute osseous abnormality. IMPRESSION: 1. Emphysematous  changes in the lungs with patchy areas of infiltration in both lungs, possibly representing multifocal pneumonia. 2. Mild cardiac enlargement. No vascular congestion. Electronically signed by: Elsie Gravely MD 08/05/2024 12:08 AM EST RP Workstation: [id number]     Procedures   Medications Ordered in the ED  potassium chloride  10 mEq in 100 mL IVPB (10 mEq Intravenous New Bag/Given 08/05/24 0211)  hydrALAZINE  (APRESOLINE ) tablet 25 mg (25 mg Oral Given 08/04/24 2349)  furosemide  (LASIX ) injection 20 mg (20 mg Intravenous Given 08/05/24 0113)  potassium chloride  SA (KLOR-CON  M) CR tablet 40 mEq (40 mEq Oral Given 08/05/24 0112)  Clinical Course as of 08/05/24 0303  [place] Aug 04, 2024  2326 Collateral hx obtained from patient's daughter [name] over the phone. She states the patient was diagnosed recently with pneumonia, and was told from UC that she was to see her PCP ASAP for follow up. Patient's daughter states she cannot see her PCP for another week so she sent her to the ER because she didn't know what else to do. She states that the patient is not currently on BP medications or ASA.  [RS]  Sat Aug 05, 2024  0141 Dr. Alfornia, hospitalist, is amenable to admitting this patient to her service. I appreciate her collaboration in the care of this patient.  [RS]    Clinical Course User Index [RS] Bobette Pleasant SAUNDERS, PA-C                                 Medical Decision Making  89 y/o female who presents with concern for persistent cough, 3 days after dx of pneumonia.   Exquisitely hypertensive on intake, asymptomatic bradycardia with HR in the 50s. VS otherwise normal. Cardiopulmonary exam as above, BLE edmea 1+, nonpitting.  Differential diagnosis for emergent cause of cough includes but is not limited to upper respiratory infection, lower respiratory infection, allergies, asthma, irritants, foreign body, medications such as ACE inhibitors, reflux, asthma, CHF, lung cancer, interstitial lung  disease, psychiatric causes, postnasal drip and postinfectious bronchospasm.    Amount and/or Complexity of Data Reviewed Labs: ordered. Radiology: ordered.  Risk Prescription drug management. Decision regarding hospitalization.  Clinical picture most consistent with acute heart failure exacerbation, ? HTN emergency, however proceeding with oral BP meds at this time due to bradycardia to the 50s. Concomitant PNA, on abx.   Janalee and her daughter voiced understanding of her medical evaluation and treatment plan. Each of their questions answered to their expressed satisfaction. They are amenable to plan for admission at this time.  This chart was dictated using voice recognition software, Dragon. Despite the best efforts of this provider to proofread and correct errors, errors may still occur which can change documentation meaning.     Final diagnoses:  Pneumonia of both lungs due to infectious organism, unspecified part of lung  Acute heart failure, unspecified heart failure type [facility])  Hypokalemia    ED Discharge Orders     None          Bobette Pleasant SAUNDERS DEVONNA 08/05/24 0303    Mesner, Selinda, MD 08/05/24 [phone]  "

## 2024-08-05 ENCOUNTER — Observation Stay (HOSPITAL_COMMUNITY)

## 2024-08-05 ENCOUNTER — Other Ambulatory Visit: Payer: Self-pay

## 2024-08-05 DIAGNOSIS — Z79899 Other long term (current) drug therapy: Secondary | ICD-10-CM | POA: Diagnosis not present

## 2024-08-05 DIAGNOSIS — I2489 Other forms of acute ischemic heart disease: Secondary | ICD-10-CM | POA: Diagnosis present

## 2024-08-05 DIAGNOSIS — Z90710 Acquired absence of both cervix and uterus: Secondary | ICD-10-CM | POA: Diagnosis not present

## 2024-08-05 DIAGNOSIS — E1151 Type 2 diabetes mellitus with diabetic peripheral angiopathy without gangrene: Secondary | ICD-10-CM | POA: Diagnosis present

## 2024-08-05 DIAGNOSIS — F0390 Unspecified dementia without behavioral disturbance: Secondary | ICD-10-CM | POA: Diagnosis present

## 2024-08-05 DIAGNOSIS — E876 Hypokalemia: Secondary | ICD-10-CM

## 2024-08-05 DIAGNOSIS — I5033 Acute on chronic diastolic (congestive) heart failure: Secondary | ICD-10-CM | POA: Diagnosis present

## 2024-08-05 DIAGNOSIS — J188 Other pneumonia, unspecified organism: Secondary | ICD-10-CM

## 2024-08-05 DIAGNOSIS — R7989 Other specified abnormal findings of blood chemistry: Secondary | ICD-10-CM

## 2024-08-05 DIAGNOSIS — Z7982 Long term (current) use of aspirin: Secondary | ICD-10-CM | POA: Diagnosis not present

## 2024-08-05 DIAGNOSIS — J189 Pneumonia, unspecified organism: Secondary | ICD-10-CM | POA: Diagnosis present

## 2024-08-05 DIAGNOSIS — I160 Hypertensive urgency: Secondary | ICD-10-CM | POA: Diagnosis present

## 2024-08-05 DIAGNOSIS — I2510 Atherosclerotic heart disease of native coronary artery without angina pectoris: Secondary | ICD-10-CM | POA: Diagnosis present

## 2024-08-05 DIAGNOSIS — E7800 Pure hypercholesterolemia, unspecified: Secondary | ICD-10-CM | POA: Diagnosis present

## 2024-08-05 DIAGNOSIS — I3139 Other pericardial effusion (noninflammatory): Secondary | ICD-10-CM | POA: Diagnosis present

## 2024-08-05 DIAGNOSIS — I110 Hypertensive heart disease with heart failure: Secondary | ICD-10-CM | POA: Diagnosis present

## 2024-08-05 DIAGNOSIS — I4581 Long QT syndrome: Secondary | ICD-10-CM | POA: Diagnosis present

## 2024-08-05 DIAGNOSIS — Z1152 Encounter for screening for COVID-19: Secondary | ICD-10-CM | POA: Diagnosis not present

## 2024-08-05 DIAGNOSIS — K219 Gastro-esophageal reflux disease without esophagitis: Secondary | ICD-10-CM | POA: Diagnosis present

## 2024-08-05 LAB — CBC WITH DIFFERENTIAL/PLATELET
Basophils Absolute: 0.1 K/uL (ref 0.0–0.1)
Basophils Relative: 2 %
Eosinophils Absolute: 0 K/uL (ref 0.0–0.5)
Eosinophils Relative: 1 %
HCT: 40 % (ref 36.0–46.0)
Hemoglobin: 13.6 g/dL (ref 12.0–15.0)
Lymphocytes Relative: 47 %
Lymphs Abs: 1.7 K/uL (ref 0.7–4.0)
MCH: 27.4 pg (ref 26.0–34.0)
MCHC: 34 g/dL (ref 30.0–36.0)
MCV: 80.6 fL (ref 80.0–100.0)
Monocytes Absolute: 0.4 K/uL (ref 0.1–1.0)
Monocytes Relative: 10 %
Neutro Abs: 1.5 K/uL — ABNORMAL LOW (ref 1.7–7.7)
Neutrophils Relative %: 40 %
Platelets: 164 K/uL (ref 150–400)
RBC: 4.96 MIL/uL (ref 3.87–5.11)
RDW: 16.2 % — ABNORMAL HIGH (ref 11.5–15.5)
WBC: 3.7 K/uL — ABNORMAL LOW (ref 4.0–10.5)
nRBC: 0 % (ref 0.0–0.2)

## 2024-08-05 LAB — COMPREHENSIVE METABOLIC PANEL WITH GFR
ALT: 24 U/L (ref 0–44)
AST: 37 U/L (ref 15–41)
Albumin: 3.6 g/dL (ref 3.5–5.0)
Alkaline Phosphatase: 65 U/L (ref 38–126)
Anion gap: 7 (ref 5–15)
BUN: 11 mg/dL (ref 8–23)
CO2: 28 mmol/L (ref 22–32)
Calcium: 8.9 mg/dL (ref 8.9–10.3)
Chloride: 103 mmol/L (ref 98–111)
Creatinine, Ser: 0.86 mg/dL (ref 0.44–1.00)
GFR, Estimated: >60 mL/min
Glucose, Bld: 110 mg/dL — ABNORMAL HIGH (ref 70–99)
Potassium: 5.9 mmol/L — ABNORMAL HIGH (ref 3.5–5.1)
Sodium: 139 mmol/L (ref 135–145)
Total Bilirubin: 0.9 mg/dL (ref 0.0–1.2)
Total Protein: 6.3 g/dL — ABNORMAL LOW (ref 6.5–8.1)

## 2024-08-05 LAB — HEMOGLOBIN A1C
Hgb A1c MFr Bld: 6.2 % — ABNORMAL HIGH (ref 4.8–5.6)
Mean Plasma Glucose: 131.24 mg/dL

## 2024-08-05 LAB — URINALYSIS, ROUTINE W REFLEX MICROSCOPIC
Bacteria, UA: NONE SEEN
Bilirubin Urine: NEGATIVE
Glucose, UA: NEGATIVE mg/dL
Ketones, ur: NEGATIVE mg/dL
Leukocytes,Ua: NEGATIVE
Nitrite: NEGATIVE
Protein, ur: NEGATIVE mg/dL
Specific Gravity, Urine: 1.005 (ref 1.005–1.030)
pH: 7 (ref 5.0–8.0)

## 2024-08-05 LAB — RESPIRATORY PANEL BY PCR

## 2024-08-05 LAB — ECHOCARDIOGRAM COMPLETE
AR max vel: 2.19 cm2
AV Area VTI: 2.06 cm2
AV Area mean vel: 2.15 cm2
AV Mean grad: 7 mmHg
AV Peak grad: 13 mmHg
Ao pk vel: 1.8 m/s
Area-P 1/2: 2.5 cm2
Height: 67 in
S' Lateral: 3.6 cm
Weight: 2095.25 [oz_av]

## 2024-08-05 LAB — CBG MONITORING, ED
Glucose-Capillary: 115 mg/dL — ABNORMAL HIGH (ref 70–99)
Glucose-Capillary: 98 mg/dL (ref 70–99)
Glucose-Capillary: 119 mg/dL — ABNORMAL HIGH (ref 70–99)

## 2024-08-05 LAB — CBC
HCT: 41.5 % (ref 36.0–46.0)
Hemoglobin: 14.2 g/dL (ref 12.0–15.0)
MCH: 27.2 pg (ref 26.0–34.0)
MCHC: 34.2 g/dL (ref 30.0–36.0)
MCV: 79.3 fL — ABNORMAL LOW (ref 80.0–100.0)
Platelets: 184 K/uL (ref 150–400)
RBC: 5.23 MIL/uL — ABNORMAL HIGH (ref 3.87–5.11)
RDW: 15.9 % — ABNORMAL HIGH (ref 11.5–15.5)
WBC: 4 K/uL (ref 4.0–10.5)
nRBC: 0 % (ref 0.0–0.2)

## 2024-08-05 LAB — GLUCOSE, CAPILLARY: Glucose-Capillary: 127 mg/dL — ABNORMAL HIGH (ref 70–99)

## 2024-08-05 LAB — PRO BRAIN NATRIURETIC PEPTIDE: Pro Brain Natriuretic Peptide: 3010 pg/mL — ABNORMAL HIGH

## 2024-08-05 LAB — BASIC METABOLIC PANEL WITH GFR
Anion gap: 9 (ref 5–15)
BUN: 12 mg/dL (ref 8–23)
CO2: 27 mmol/L (ref 22–32)
Calcium: 8.8 mg/dL — ABNORMAL LOW (ref 8.9–10.3)
Chloride: 104 mmol/L (ref 98–111)
Creatinine, Ser: 0.84 mg/dL (ref 0.44–1.00)
GFR, Estimated: >60 mL/min
Glucose, Bld: 118 mg/dL — ABNORMAL HIGH (ref 70–99)
Potassium: 2.8 mmol/L — ABNORMAL LOW (ref 3.5–5.1)
Sodium: 140 mmol/L (ref 135–145)

## 2024-08-05 LAB — TROPONIN T, HIGH SENSITIVITY
Troponin T High Sensitivity: 25 ng/L — ABNORMAL HIGH (ref 0–19)
Troponin T High Sensitivity: 26 ng/L — ABNORMAL HIGH (ref 0–19)

## 2024-08-05 LAB — RESP PANEL BY RT-PCR (RSV, FLU A&B, COVID)  RVPGX2
Influenza A by PCR: NEGATIVE
Influenza B by PCR: NEGATIVE
Resp Syncytial Virus by PCR: NEGATIVE
SARS Coronavirus 2 by RT PCR: NEGATIVE

## 2024-08-05 LAB — MAGNESIUM: Magnesium: 1.7 mg/dL (ref 1.7–2.4)

## 2024-08-05 LAB — PROCALCITONIN: Procalcitonin: 0.11 ng/mL

## 2024-08-05 LAB — POTASSIUM: Potassium: 3.3 mmol/L — ABNORMAL LOW (ref 3.5–5.1)

## 2024-08-05 LAB — MRSA NEXT GEN BY PCR, NASAL: MRSA by PCR Next Gen: NOT DETECTED

## 2024-08-05 MED ORDER — LOSARTAN POTASSIUM 25 MG PO TABS
25.0000 mg | ORAL_TABLET | Freq: Every day | ORAL | Status: DC
  Administered 2024-08-05 – 2024-08-07 (×3): 25 mg via ORAL
  Filled 2024-08-05 (×3): qty 1

## 2024-08-05 MED ORDER — MEMANTINE HCL 10 MG PO TABS
10.0000 mg | ORAL_TABLET | Freq: Two times a day (BID) | ORAL | Status: DC
  Administered 2024-08-05 – 2024-08-07 (×4): 10 mg via ORAL
  Filled 2024-08-05 (×4): qty 1

## 2024-08-05 MED ORDER — QUETIAPINE FUMARATE 25 MG PO TABS
25.0000 mg | ORAL_TABLET | Freq: Every day | ORAL | Status: DC
  Administered 2024-08-05 – 2024-08-06 (×2): 25 mg via ORAL
  Filled 2024-08-05 (×2): qty 1

## 2024-08-05 MED ORDER — SODIUM CHLORIDE 0.9 % IV SOLN
2.0000 g | Freq: Two times a day (BID) | INTRAVENOUS | Status: DC
  Administered 2024-08-05 – 2024-08-07 (×5): 2 g via INTRAVENOUS
  Filled 2024-08-05 (×6): qty 12.5

## 2024-08-05 MED ORDER — PIPERACILLIN-TAZOBACTAM 3.375 G IVPB: 3.3750 g | Freq: Three times a day (TID) | INTRAVENOUS | Status: DC

## 2024-08-05 MED ORDER — ACETAMINOPHEN 325 MG PO TABS
650.0000 mg | ORAL_TABLET | Freq: Four times a day (QID) | ORAL | Status: DC | PRN
  Administered 2024-08-05: 650 mg via ORAL
  Filled 2024-08-05: qty 2

## 2024-08-05 MED ORDER — VANCOMYCIN HCL 1250 MG/250ML IV SOLN
1250.0000 mg | INTRAVENOUS | Status: DC
  Administered 2024-08-05 – 2024-08-06 (×2): 1250 mg via INTRAVENOUS
  Filled 2024-08-05 (×2): qty 250

## 2024-08-05 MED ORDER — INSULIN ASPART 100 UNIT/ML IJ SOLN: 0.0000 [IU] | Freq: Every day | INTRAMUSCULAR | Status: DC

## 2024-08-05 MED ORDER — HYDRALAZINE HCL 20 MG/ML IJ SOLN
5.0000 mg | INTRAMUSCULAR | Status: DC | PRN
  Administered 2024-08-05 (×3): 5 mg via INTRAVENOUS
  Filled 2024-08-05 (×3): qty 1

## 2024-08-05 MED ORDER — FUROSEMIDE 10 MG/ML IJ SOLN
20.0000 mg | Freq: Once | INTRAMUSCULAR | Status: AC
  Administered 2024-08-05: 20 mg via INTRAVENOUS
  Filled 2024-08-05: qty 2

## 2024-08-05 MED ORDER — INSULIN ASPART 100 UNIT/ML IJ SOLN
0.0000 [IU] | Freq: Three times a day (TID) | INTRAMUSCULAR | Status: DC
  Administered 2024-08-06: 1 [IU] via SUBCUTANEOUS
  Filled 2024-08-05: qty 1

## 2024-08-05 MED ORDER — POTASSIUM CHLORIDE CRYS ER 20 MEQ PO TBCR
40.0000 meq | EXTENDED_RELEASE_TABLET | Freq: Once | ORAL | Status: AC
  Administered 2024-08-05: 40 meq via ORAL
  Filled 2024-08-05: qty 2

## 2024-08-05 MED ORDER — POTASSIUM CHLORIDE 10 MEQ/100ML IV SOLN
10.0000 meq | INTRAVENOUS | Status: AC
  Administered 2024-08-05 (×3): 10 meq via INTRAVENOUS
  Filled 2024-08-05 (×3): qty 100

## 2024-08-05 MED ORDER — ACETAMINOPHEN 650 MG RE SUPP: 650.0000 mg | Freq: Four times a day (QID) | RECTAL | Status: DC | PRN

## 2024-08-05 MED ORDER — GUAIFENESIN-DM 100-10 MG/5ML PO SYRP: 5.0000 mL | ORAL_SOLUTION | ORAL | Status: DC | PRN

## 2024-08-05 MED ORDER — FUROSEMIDE 10 MG/ML IJ SOLN
40.0000 mg | Freq: Two times a day (BID) | INTRAMUSCULAR | Status: DC
  Administered 2024-08-05 – 2024-08-06 (×3): 40 mg via INTRAVENOUS
  Filled 2024-08-05 (×3): qty 4

## 2024-08-05 MED ORDER — ENOXAPARIN SODIUM 40 MG/0.4ML IJ SOSY
40.0000 mg | PREFILLED_SYRINGE | INTRAMUSCULAR | Status: DC
  Administered 2024-08-05 – 2024-08-06 (×2): 40 mg via SUBCUTANEOUS
  Filled 2024-08-05 (×2): qty 0.4

## 2024-08-05 MED ORDER — VANCOMYCIN HCL 1250 MG/250ML IV SOLN
1250.0000 mg | Freq: Once | INTRAVENOUS | Status: DC
  Filled 2024-08-05: qty 250

## 2024-08-05 NOTE — Progress Notes (Signed)
 "  PROGRESS NOTE    [name]  FMW:[id number] DOB: 09/26/1930 DOA: 08/04/2024 PCP: Gerome Brunet, DO   Brief Narrative: [name] is a 89 y.o. female with a history of dementia, CAD, PAD, hypertension, hyperlipidemia, diabetes mellitus type 2, GERD, arthritis.  Patient presented secondary to cough and congestion and found to have chest x-ray evidence of worsening pneumonia. IV antibiotics started.   Assessment/Plan:  Multifocal pneumonia Present on admission and noted on imaging. Patient previously started on treatment for pneumonia with Augmentin  and doxycycline . Patient stated on Vancomycin  and Cefepime  this admission. No blood cultures obtained. - Continue Vancomycin  and Cefepime  for now  Hypertensive urgency Present on admission. Possibly related to fluid overload. Hydralazine  given. Losartan  started, but with Transthoracic Echocardiogram results, will hold further scheduled antihypertensives, manage with as needed antihypertensives and treat fluid overload.  Acute on chronic HFpEF ProBNP is elevated. Patient was given Lasix  in the ED. Repeat Transthoracic Echocardiogram obtained this admission and is significant for an LVEF of 60-65% with grade II diastolic dysfunction. Small pericardial effusion is also present. Echo results suggest fluid overload. - Lasix  40 mg IV BID  Hypokalemia - Continue potassium supplementation  Prolonged QTc Last QTc of 508 on EKG. - Avoid QTc prolonging mediation  Likely demand ischemia Mildly elevated troponin of 25 with negative delta of 26. No chest pain.  CAD No chest pain.  Diabetes mellitus type 2 Well controlled based on a hemoglobin A1C of 6.2%. - Continue SSI  Dementia - Continue Namenda   PAD Noted.   DVT prophylaxis: Lovenox  Code Status:   Code Status: Full Code Family Communication: None at bedside Disposition Plan: Discharge pending completion of IV diuresis   Consultants:  None  Procedures:  Transthoracic  Echocardiogram  Antimicrobials: Vancomycin  Cefepime     Subjective: Patient reports feeling well this morning.  Objective: BP (!) 157/66   Pulse (!) 58   Temp 100 F (37.8 C) (Oral)   Resp 16   Ht 5' 7 (1.702 m)   Wt 59.4 kg   SpO2 100%   BMI 20.51 kg/m   Examination:  General exam: Appears calm and comfortable. Respiratory system: Clear to auscultation. Respiratory effort normal. Cardiovascular system: S1 & S2 heard, RRR.  Gastrointestinal system: Abdomen is nondistended, soft and nontender. Normal bowel sounds heard. Central nervous system: Alert and oriented. No focal neurological deficits. Psychiatry: Judgement and insight appear normal. Mood & affect appropriate.    Data Reviewed: I have personally reviewed following labs and imaging studies   Last CBC Lab Results  Component Value Date   WBC 4.0 08/05/2024   HGB 14.2 08/05/2024   HCT 41.5 08/05/2024   MCV 79.3 (L) 08/05/2024   MCH 27.2 08/05/2024   RDW 15.9 (H) 08/05/2024   PLT 184 08/05/2024     Last metabolic panel Lab Results  Component Value Date   GLUCOSE 110 (H) 08/05/2024   NA 139 08/05/2024   K 5.9 (H) 08/05/2024   CL 103 08/05/2024   CO2 28 08/05/2024   BUN 11 08/05/2024   CREATININE 0.86 08/05/2024   GFRNONAA >60 08/05/2024   CALCIUM  8.9 08/05/2024   PROT 6.3 (L) 08/05/2024   ALBUMIN 3.6 08/05/2024   BILITOT 0.9 08/05/2024   ALKPHOS 65 08/05/2024   AST 37 08/05/2024   ALT 24 08/05/2024   ANIONGAP 7 08/05/2024     Creatinine Clearance: Estimated Creatinine Clearance: 40 mL/min (by C-G formula based on SCr of 0.86 mg/dL).  Recent Results (from the past 240 hours)  Resp  panel by RT-PCR (RSV, Flu A&B, Covid) Anterior Nasal Swab     Status: None   Collection Time: 08/05/24  2:14 AM   Specimen: Anterior Nasal Swab  Result Value Ref Range Status   SARS Coronavirus 2 by RT PCR NEGATIVE NEGATIVE Final   Influenza A by PCR NEGATIVE NEGATIVE Final   Influenza B by PCR NEGATIVE NEGATIVE  Final    Comment: (NOTE) The Xpert Xpress SARS-CoV-2/FLU/RSV plus assay is intended as an aid in the diagnosis of influenza from Nasopharyngeal swab specimens and should not be used as a sole basis for treatment. Nasal washings and aspirates are unacceptable for Xpert Xpress SARS-CoV-2/FLU/RSV testing.  Fact Sheet for Patients: bloggercourse.com  Fact Sheet for Healthcare Providers: seriousbroker.it  This test is not yet approved or cleared by the [place]  FDA and has been authorized for detection and/or diagnosis of SARS-CoV-2 by FDA under an Emergency Use Authorization (EUA). This EUA will remain in effect (meaning this test can be used) for the duration of the COVID-19 declaration under Section 564(b)(1) of the Act, 21 U.S.C. section 360bbb-3(b)(1), unless the authorization is terminated or revoked.     Resp Syncytial Virus by PCR NEGATIVE NEGATIVE Final    Comment: (NOTE) Fact Sheet for Patients: bloggercourse.com  Fact Sheet for Healthcare Providers: seriousbroker.it  This test is not yet approved or cleared by the [place]  FDA and has been authorized for detection and/or diagnosis of SARS-CoV-2 by FDA under an Emergency Use Authorization (EUA). This EUA will remain in effect (meaning this test can be used) for the duration of the COVID-19 declaration under Section 564(b)(1) of the Act, 21 U.S.C. section 360bbb-3(b)(1), unless the authorization is terminated or revoked.  Performed at [facility], [address] [place], [place] [address]       Radiology Studies: DG Chest 1 View Result Date: 08/05/2024 EXAM: 1 VIEW(S) XRAY OF THE CHEST 08/04/2024 11:32:00 PM COMPARISON: Comparison with 07/30/2024. CLINICAL HISTORY: Cough. FINDINGS: LUNGS AND PLEURA: Emphysematous changes in the lungs. Patchy areas of infiltration in both lungs. This may represent  multifocal pneumonia. Chronic central interstitial changes are also suggested. No pleural effusion. No pneumothorax. HEART AND MEDIASTINUM: Mild cardiac enlargement. No vascular congestion. Mediastinal contours appear intact. BONES AND SOFT TISSUES: Degenerative changes in the spine and shoulders. No acute osseous abnormality. IMPRESSION: 1. Emphysematous changes in the lungs with patchy areas of infiltration in both lungs, possibly representing multifocal pneumonia. 2. Mild cardiac enlargement. No vascular congestion. Electronically signed by: Elsie Gravely MD 08/05/2024 12:08 AM EST RP Workstation: [id number]      LOS: 0 days    Elgin Lam, MD [facility] 08/05/2024, 7:45 AM   If 7PM-7AM, please contact night-coverage [email or web address]  "

## 2024-08-05 NOTE — Evaluation (Signed)
 Occupational Therapy Evaluation Patient Details Name: [name] MRN: [id number] DOB: 1931/04/14 Today's Date: 08/05/2024   History of Present Illness   89 y.o. female presented 08/04/24 in follow-up of recent pneumonia diagnosis (could not get in with PCP). BP 230/98; +multifocal pna; PMH significant of dementia, CAD, PVD, hypertension, hyperlipidemia, type 2 diabetes, GERD, arthritis.     Clinical Impressions PTA pt lives with her daughter who assists with bathing and IADL tasks. At baseline pt ambulates with a SBQC. Pt most likely is close to baseline regarding ADL tasks. Acute OT to follow to facilitate safe DC home however do not anticipate the need for OT follow up.  VSS during ambulation on RA     If plan is discharge home, recommend the following:   A little help with bathing/dressing/bathroom;Assistance with cooking/housework;A little help with walking and/or transfers     Functional Status Assessment   Patient has had a recent decline in their functional status and demonstrates the ability to make significant improvements in function in a reasonable and predictable amount of time.     Equipment Recommendations   None recommended by OT     Recommendations for Other Services         Precautions/Restrictions   Precautions Precautions: Fall Recall of Precautions/Restrictions: Intact     Mobility Bed Mobility Overal bed mobility: Modified Independent             General bed mobility comments: ED stretcher with HOB elevated    Transfers Overall transfer level: Needs assistance Equipment used: Quad cane Transfers: Sit to/from Stand Sit to Stand: Contact guard assist           General transfer comment: pt prefers to use SBQC backwards (with feet that protrude farthest turned towards her); CGA for safety/no imbalance      Balance Overall balance assessment: Needs assistance Sitting-balance support: No upper extremity supported, Feet  unsupported Sitting balance-Leahy Scale: Good     Standing balance support: Single extremity supported Standing balance-Leahy Scale: Poor Standing balance comment: Requires UE support via SBQC                           ADL either performed or assessed with clinical judgement   ADL Overall ADL's : Needs assistance/impaired                                     Functional mobility during ADLs: Contact guard assist;Rolling walker (2 wheels) General ADL Comments: overall set up/A with ADL tasks. Most likley close to baseline     Vision Baseline Vision/History: 1 Wears glasses       Perception         Praxis         Pertinent Vitals/Pain Pain Assessment Pain Assessment: No/denies pain     Extremity/Trunk Assessment Upper Extremity Assessment Upper Extremity Assessment: Generalized weakness (but functional)   Lower Extremity Assessment Lower Extremity Assessment: Defer to PT evaluation   Cervical / Trunk Assessment Cervical / Trunk Assessment: Kyphotic   Communication Communication Communication: Impaired Factors Affecting Communication: Hearing impaired   Cognition Arousal: Alert Behavior During Therapy: WFL for tasks assessed/performed Cognition: History of cognitive impairments (most likely baseline)                               Following commands: Intact  Cueing  General Comments   Cueing Techniques: Verbal cues;Gestural cues  VSS   Exercises     Shoulder Instructions      Home Living Family/patient expects to be discharged to:: Private residence Living Arrangements: Children (daughter) Available Help at Discharge: Available PRN/intermittently Type of Home: Apartment Home Access: Stairs to enter [employer] of Steps: 3 Entrance Stairs-Rails: Right;Left;Can reach both Home Layout: One level     Bathroom Shower/Tub: Chief Strategy Officer: Standard     Home Equipment:  Shower seat;Grab bars - tub/shower;Cane - quad (small-based and large-based quad canes;)          Prior Functioning/Environment Prior Level of Function : Needs assist             Mobility Comments: uses SBQC for ambulation ADLs Comments: daughter assists her with shower (even once she's seated on the seat)    OT Problem List: Decreased activity tolerance;Decreased strength   OT Treatment/Interventions: Self-care/ADL training;Therapeutic exercise;DME and/or AE instruction;Therapeutic activities;Patient/family education      OT Goals(Current goals can be found in the care plan section)   Acute Rehab OT Goals Patient Stated Goal: home OT Goal Formulation: With patient/family Time For Goal Achievement: 08/19/24 Potential to Achieve Goals: Good   OT Frequency:  Min 2X/week    Co-evaluation              AM-PAC OT 6 Clicks Daily Activity     Outcome Measure Help from another person eating meals?: None Help from another person taking care of personal grooming?: A Little Help from another person toileting, which includes using toliet, bedpan, or urinal?: A Little Help from another person bathing (including washing, rinsing, drying)?: A Little Help from another person to put on and taking off regular upper body clothing?: A Little Help from another person to put on and taking off regular lower body clothing?: A Little 6 Click Score: 19   End of Session Equipment Utilized During Treatment: Gait belt;Other (comment) [facility]) Nurse Communication: Mobility status  Activity Tolerance: Patient tolerated treatment well Patient left: in bed;with call bell/phone within reach  OT Visit Diagnosis: Unsteadiness on feet (R26.81)                Time: 8582-8566 OT Time Calculation (min): 16 min Charges:  OT General Charges $OT Visit: 1 Visit OT Evaluation $OT Eval Low Complexity: 1 Low  Ranee Peasley, OT/L   Acute OT Clinical Specialist Acute Rehabilitation Services Pager  [phone] Office [phone]   [facility] 08/05/2024, 3:23 PM

## 2024-08-05 NOTE — ED Notes (Signed)
 Echo at bedside

## 2024-08-05 NOTE — H&P (Signed)
 " History and Physical    [name] FMW:[id number] DOB: 1931/05/19 DOA: 08/04/2024  PCP: Gerome Brunet, DO  Patient coming from: Home  Chief Complaint: Pneumonia follow-up  HPI: [name] is a 89 y.o. female with medical history significant of dementia, CAD, PVD, hypertension, hyperlipidemia, type 2 diabetes, GERD, arthritis.  She was recently seen in the ED on 12/28 for cough/congestion and chest x-ray was showing right lower lobe opacity concerning for pneumonia.  She was discharged on 10-day course of Augmentin  and doxycycline .  Patient was advised to follow-up with her PCP but could not get an appointment until next week so she came into the ED tonight to be evaluated.  Hypertensive with EMS with blood pressure 230/98 initially.  She was given 250 mL IV fluids by EMS.  Vital signs on arrival to the ED: Temperature 98.7 F, pulse 51, respiratory rate 19, blood pressure 201/83, and SpO2 99% on room air.  Labs notable for WBC count 3.7, potassium 2.8, glucose 118, creatinine 0.84, troponin 25> 26, proBNP 3010, UA not suggestive of infection, COVID/influenza/RSV PCR pending.  Chest x-ray showing emphysematous changes in the lungs with patchy areas of infiltration in both lungs possibly representing multifocal pneumonia.  Patient was given IV Lasix  20 mg, p.o. hydralazine  25 mg, and p.o. and IV potassium in the ED.  TRH called to admit.  Patient is reporting ongoing cough productive of sputum.  Denies shortness of breath or chest pain.  She is reporting subjective fevers.  Denies headaches, nausea, vomiting, abdominal pain, diarrhea, or any urinary symptoms.  She does not know which medications she is taking at home as her daughter gives them to her.  She is not sure if she is receiving her home antihypertensive medication.  No other complaints.  Review of Systems:  Review of Systems  All other systems reviewed and are negative.   Past Medical History:  Diagnosis Date   Acid reflux     Arthritis    Dementia (HCC)    Diabetes mellitus without complication (HCC)    High cholesterol    Hypertension     Past Surgical History:  Procedure Laterality Date   ABDOMINAL HYSTERECTOMY     APPENDECTOMY     CATARACT EXTRACTION, BILATERAL     CHOLECYSTECTOMY     HEMORRHOID SURGERY     TONSILLECTOMY       reports that she has never smoked. She has never used smokeless tobacco. She reports that she does not drink alcohol and does not use drugs.  Allergies[1]  History reviewed. No pertinent family history.  Prior to Admission medications  Medication Sig Start Date End Date Taking? Authorizing Provider  amoxicillin -clavulanate (AUGMENTIN ) 500-125 MG tablet Take 1 tablet by mouth in the morning and at bedtime for 10 days. 07/30/24 08/09/24  Raspet, Rocky POUR, PA-C  aspirin  EC 81 MG tablet Take 1 tablet (81 mg total) by mouth daily. Swallow whole. 04/07/23   West, Katlyn D, NP  atorvastatin  (LIPITOR) 20 MG tablet TAKE 1 TABLET(20 MG) BY MOUTH DAILY 07/06/23   Court Dorn PARAS, MD  benzonatate  (TESSALON ) 100 MG capsule Take 1 capsule (100 mg total) by mouth every 8 (eight) hours. 07/30/24   Raspet, Erin K, PA-C  doxycycline  (VIBRAMYCIN ) 100 MG capsule Take 1 capsule (100 mg total) by mouth 2 (two) times daily. 07/30/24   Raspet, Rocky POUR, PA-C  memantine  (NAMENDA ) 10 MG tablet Take 10 mg by mouth 2 (two) times daily. 09/27/23   [provider]  metoprolol   succinate (TOPROL -XL) 25 MG 24 hr tablet Take 0.5 tablets (12.5 mg total) by mouth daily. 04/07/23 02/29/24  West, Katlyn D, NP  Propylene Glycol (SYSTANE BALANCE OP) Place 1 drop into both eyes daily as needed (for dry eye).    [provider]  QUEtiapine  (SEROQUEL ) 25 MG tablet Take 25 mg by mouth at bedtime. 09/28/23   [provider]    Physical Exam: Vitals:   08/05/24 0015 08/05/24 0030 08/05/24 0045 08/05/24 0145  BP: (!) 206/78 (!) 199/71 (!) 196/74 (!) 193/77  Pulse: (!) 52 (!) 56 (!) 55 (!) 58  Resp:  17   16  Temp:      TempSrc:      SpO2: 100% 100% 100% 100%  Weight:      Height:        Physical Exam Vitals reviewed.  Constitutional:      General: She is not in acute distress. HENT:     Head: Normocephalic and atraumatic.  Eyes:     Extraocular Movements: Extraocular movements intact.  Cardiovascular:     Rate and Rhythm: Normal rate and regular rhythm.  Pulmonary:     Effort: Pulmonary effort is normal. No respiratory distress.     Breath sounds: Rhonchi present.  Abdominal:     General: Bowel sounds are normal.     Palpations: Abdomen is soft.     Tenderness: There is no abdominal tenderness. There is no guarding.  Musculoskeletal:     Cervical back: Normal range of motion.  Skin:    General: Skin is warm and dry.  Neurological:     General: No focal deficit present.     Mental Status: She is alert and oriented to person, place, and time.     Cranial Nerves: No cranial nerve deficit.     Sensory: No sensory deficit.     Motor: No weakness.     Labs on Admission: I have personally reviewed following labs and imaging studies  CBC: Recent Labs  Lab 07/30/24 2010 08/05/24 0002  WBC 7.6 3.7*  NEUTROABS 6.5 1.5*  HGB 14.1 13.6  HCT 41.4 40.0  MCV 80.7 80.6  PLT 71* 164   Basic Metabolic Panel: Recent Labs  Lab 07/30/24 2010 08/05/24 0002  NA 141 140  K 4.5 2.8*  CL 105 104  CO2 26 27  GLUCOSE 104* 118*  BUN 17 12  CREATININE 0.96 0.84  CALCIUM  9.4 8.8*   GFR: Estimated Creatinine Clearance: 40.9 mL/min (by C-G formula based on SCr of 0.84 mg/dL). Liver Function Tests: Recent Labs  Lab 07/30/24 2010  AST 50*  ALT 23  ALKPHOS 58  BILITOT 0.7  PROT 6.5  ALBUMIN 3.6   No results for input(s): LIPASE, AMYLASE in the last 168 hours. No results for input(s): AMMONIA in the last 168 hours. Coagulation Profile: No results for input(s): INR, PROTIME in the last 168 hours. Cardiac Enzymes: No results for input(s): CKTOTAL, CKMB,  CKMBINDEX, TROPONINI in the last 168 hours. BNP (last 3 results) Recent Labs    08/05/24 0002  PROBNP 3,010.0*   HbA1C: No results for input(s): HGBA1C in the last 72 hours. CBG: No results for input(s): GLUCAP in the last 168 hours. Lipid Profile: No results for input(s): CHOL, HDL, LDLCALC, TRIG, CHOLHDL, LDLDIRECT in the last 72 hours. Thyroid  Function Tests: No results for input(s): TSH, T4TOTAL, FREET4, T3FREE, THYROIDAB in the last 72 hours. Anemia Panel: No results for input(s): VITAMINB12, FOLATE, FERRITIN, TIBC, IRON, RETICCTPCT in  the last 72 hours. Urine analysis:    Component Value Date/Time   COLORURINE STRAW (A) 08/05/2024 0214   APPEARANCEUR CLEAR 08/05/2024 0214   LABSPEC 1.005 08/05/2024 0214   PHURINE 7.0 08/05/2024 0214   GLUCOSEU NEGATIVE 08/05/2024 0214   HGBUR SMALL (A) 08/05/2024 0214   BILIRUBINUR NEGATIVE 08/05/2024 0214   KETONESUR NEGATIVE 08/05/2024 0214   PROTEINUR NEGATIVE 08/05/2024 0214   UROBILINOGEN 1.0 08/29/2007 1525   NITRITE NEGATIVE 08/05/2024 0214   LEUKOCYTESUR NEGATIVE 08/05/2024 0214    Radiological Exams on Admission: DG Chest 1 View Result Date: 08/05/2024 EXAM: 1 VIEW(S) XRAY OF THE CHEST 08/04/2024 11:32:00 PM COMPARISON: Comparison with 07/30/2024. CLINICAL HISTORY: Cough. FINDINGS: LUNGS AND PLEURA: Emphysematous changes in the lungs. Patchy areas of infiltration in both lungs. This may represent multifocal pneumonia. Chronic central interstitial changes are also suggested. No pleural effusion. No pneumothorax. HEART AND MEDIASTINUM: Mild cardiac enlargement. No vascular congestion. Mediastinal contours appear intact. BONES AND SOFT TISSUES: Degenerative changes in the spine and shoulders. No acute osseous abnormality. IMPRESSION: 1. Emphysematous changes in the lungs with patchy areas of infiltration in both lungs, possibly representing multifocal pneumonia. 2. Mild cardiac enlargement. No  vascular congestion. Electronically signed by: Elsie Gravely MD 08/05/2024 12:08 AM EST RP Workstation: [id number]    EKG: Independently reviewed.  Sinus bradycardia, QTc 508, and no STEMI.  Assessment and Plan  Multifocal pneumonia Seen in the ED 6 days ago for right lower lobe pneumonia and placed on antibiotics including Augmentin  and doxycycline .  Patient continues to endorse productive cough.  Repeat chest x-ray done tonight showing patchy areas of infiltration in both lungs possibly representing multifocal pneumonia.  WBC count 3.7.  No signs of sepsis.  She is saturating 100% on room air.  COVID/influenza/RSV PCR pending.  Continue antibiotic coverage with vancomycin  and cefepime .  MRSA PCR screen.  Check procalcitonin level.  Robitussin-DM as needed for cough.  Hypertensive urgency SBP now improved to 160s.  Unclear if patient is receiving her home metoprolol .  Hold metoprolol  at this time due to mild bradycardia.  IV hydralazine  PRN SBP >160.  Hypokalemia QT prolongation Hypokalemia likely due to poor p.o. intake in the setting of acute illness.  Monitor potassium and magnesium  levels, continue to replace as needed.  Avoid QT prolonging drugs and follow-up repeat EKG in the morning.  Elevated troponin History of CAD Troponin mildly elevated but stable, pattern not consistent with ACS.  Patient is not endorsing chest pain.  Elevated proBNP Does not appear overtly volume overloaded on exam.  Patient was given IV Lasix  20 mg in the ED.  Echo done in August 2024 showing EF 70 to 75%, grade 1 diastolic dysfunction, trivial mitral regurgitation, and trivial aortic regurgitation.  Repeat echocardiogram ordered.  Monitor intake and output, daily weights.  Type 2 diabetes Hemoglobin A1c 6.9 in August 2024, repeat ordered.  Placed on sensitive sliding scale insulin  ACHS.  Dementia PVD Hyperlipidemia GERD Unable to verify home medications with the patient.  Awaiting pharmacy med  rec.  DVT prophylaxis: Lovenox  Code Status: Full Code (discussed with the patient) Family Communication: No family available at this time. Level of care: Progressive Care Unit Admission status: It is my clinical opinion that referral for OBSERVATION is reasonable and necessary in this patient based on the above information provided. The aforementioned taken together are felt to place the patient at high risk for further clinical deterioration. However, it is anticipated that the patient may be medically stable for discharge from the hospital  within 24 to 48 hours.  Editha Ram MD [facility]  If 7PM-7AM, please contact night-coverage [email or web address]  08/05/2024, 2:35 AM       [1] No Known Allergies  "

## 2024-08-05 NOTE — Progress Notes (Signed)
 Pharmacy Antibiotic Note  [name] is a 89 y.o. female admitted on 08/04/2024 with pneumonia.  Pharmacy has been consulted for vancomycin  and cefepime  dosing.  Plan: Vancomycin  1250 mg IV q 26 hrs, calculated AUC 491 Cefepime  2g IV q 12 hrs  Height: 5' 7 (170.2 cm) Weight: 59.4 kg (130 lb 15.3 oz) IBW/kg (Calculated) : 61.6  Temp (24hrs), Avg:98.5 F (36.9 C), Min:98.2 F (36.8 C), Max:98.7 F (37.1 C)  Recent Labs  Lab 07/30/24 2010 08/05/24 0002  WBC 7.6 3.7*  CREATININE 0.96 0.84    Estimated Creatinine Clearance: 40.9 mL/min (by C-G formula based on SCr of 0.84 mg/dL).    Allergies[1]   Thank you for allowing pharmacy to be a part of this patients care.  Harlene Barlow, Berd[employer], BCCP Clinical Pharmacist  08/05/2024 4:21 AM   [facility] pharmacy phone numbers are listed on [email or web address]      [1] No Known Allergies

## 2024-08-05 NOTE — Evaluation (Signed)
 Physical Therapy Evaluation Patient Details Name: [name] MRN: [id number] DOB: February 14, 1931 Today's Date: 08/05/2024  History of Present Illness  89 y.o. female presented 08/04/24 in follow-up of recent pneumonia diagnosis (could not get in with PCP). BP 230/98; +multifocal pna; PMH significant of dementia, CAD, PVD, hypertension, hyperlipidemia, type 2 diabetes, GERD, arthritis.  Clinical Impression   Pt admitted secondary to problem above with deficits below. PTA patient lives with daughter, uses a quad cane for all ambulation, and denies h/o falls. She has 3 steps to enter apartment with bil rails.  Pt currently required CGA for safety with sit to stand and ambulation with quad cane. Pt prefers to hold cane backwards, which puts her at increased risk of tripping over larger side of quad cane. She tends to rock the quad cane from inside back foot to inside front foot, which makes cane less stable. She had one small stagger step when turning 180 degrees and self-corrected without physical assist. Anticipate patient will benefit from PT to address problems listed below. Will continue to follow acutely to maximize functional mobility, independence, and safety. Currently would recommend OPPT on discharge. Pt refusing to attempt to use rolling walker, therefore no DME needs.          If plan is discharge home, recommend the following: A little help with walking and/or transfers;A little help with bathing/dressing/bathroom;Assistance with cooking/housework;Assist for transportation;Help with stairs or ramp for entrance   Can travel by private vehicle        Equipment Recommendations None recommended by PT  Recommendations for Other Services  OT consult    Functional Status Assessment Patient has had a recent decline in their functional status and demonstrates the ability to make significant improvements in function in a reasonable and predictable amount of time.     Precautions /  Restrictions Precautions Precautions: Fall Recall of Precautions/Restrictions: Intact      Mobility  Bed Mobility Overal bed mobility: Modified Independent             General bed mobility comments: ED stretcher with HOB elevated    Transfers Overall transfer level: Needs assistance Equipment used: Quad cane Transfers: Sit to/from Stand Sit to Stand: Contact guard assist           General transfer comment: pt prefers to use SBQC backwards (with feet that protrude farthest turned towards her); CGA for safety/no imbalance    Ambulation/Gait Ambulation/Gait assistance: Contact guard assist Gait Distance (Feet): 80 Feet Assistive device: Quad cane Gait Pattern/deviations: Step-through pattern, Decreased stride length, Trunk flexed, Staggering left   Gait velocity interpretation: 1.31 - 2.62 ft/sec, indicative of limited community ambulator   General Gait Details: one small stagger step as turning 180 degrees towards her right; pt recovered without assist  Stairs            Wheelchair Mobility     Tilt Bed    Modified Rankin (Stroke Patients Only)       Balance Overall balance assessment: Needs assistance Sitting-balance support: No upper extremity supported, Feet unsupported Sitting balance-Leahy Scale: Good     Standing balance support: Single extremity supported Standing balance-Leahy Scale: Poor Standing balance comment: Requires UE support via SBQC                             Pertinent Vitals/Pain Pain Assessment Pain Assessment: No/denies pain    Home Living Family/patient expects to be discharged to:: Private residence Living Arrangements:  Children (daughter) Available Help at Discharge: Available PRN/intermittently Type of Home: Apartment Home Access: Stairs to enter Entrance Stairs-Rails: Right;Left;Can reach both Entrance Stairs-Number of Steps: 3   Home Layout: One level Home Equipment: Shower seat;Grab bars -  tub/shower;Cane - quad (small-based and large-based quad canes;)      Prior Function Prior Level of Function : Needs assist             Mobility Comments: uses SBQC for ambulation ADLs Comments: daughter assists her with shower (even once she's seated on the seat)     Extremity/Trunk Assessment   Upper Extremity Assessment Upper Extremity Assessment: Defer to OT evaluation    Lower Extremity Assessment Lower Extremity Assessment: Generalized weakness;RLE deficits/detail;LLE deficits/detail RLE Sensation: history of peripheral neuropathy LLE Sensation: history of peripheral neuropathy    Cervical / Trunk Assessment Cervical / Trunk Assessment: Kyphotic  Communication   Communication Communication: Impaired Factors Affecting Communication: Hearing impaired    Cognition Arousal: Alert Behavior During Therapy: WFL for tasks assessed/performed                             Following commands: Intact       Cueing Cueing Techniques: Verbal cues, Gestural cues     General Comments General comments (skin integrity, edema, etc.): BP 190/79 HR 60 in supine; after ambulating 188/77 HR 57 supine    Exercises     Assessment/Plan    PT Assessment Patient needs continued PT services  PT Problem List Decreased strength;Decreased balance;Decreased mobility;Decreased knowledge of use of DME;Impaired sensation       PT Treatment Interventions DME instruction;Gait training;Stair training;Functional mobility training;Therapeutic activities;Therapeutic exercise;Balance training;Patient/family education    PT Goals (Current goals can be found in the Care Plan section)  Acute Rehab PT Goals Patient Stated Goal: walk with her quad cane (refuses to attempt RW) PT Goal Formulation: With patient Time For Goal Achievement: 08/19/24 Potential to Achieve Goals: Good    Frequency Min 2X/week     Co-evaluation               AM-PAC PT 6 Clicks Mobility   Outcome Measure Help needed turning from your back to your side while in a flat bed without using bedrails?: None Help needed moving from lying on your back to sitting on the side of a flat bed without using bedrails?: None Help needed moving to and from a bed to a chair (including a wheelchair)?: A Little Help needed standing up from a chair using your arms (e.g., wheelchair or bedside chair)?: A Little Help needed to walk in hospital room?: A Little Help needed climbing 3-5 steps with a railing? : A Little 6 Click Score: 20    End of Session Equipment Utilized During Treatment: Gait belt Activity Tolerance: Patient tolerated treatment well Patient left: in bed;with call bell/phone within reach (on ED stretcher) Nurse Communication: Mobility status PT Visit Diagnosis: Unsteadiness on feet (R26.81);Other abnormalities of gait and mobility (R26.89);Muscle weakness (generalized) (M62.81)    Time: 8947-8876 PT Time Calculation (min) (ACUTE ONLY): 31 min   Charges:   PT Evaluation $PT Eval Low Complexity: 1 Low PT Treatments $Gait Training: 8-22 mins PT General Charges $$ ACUTE PT VISIT: 1 Visit          Macario RAMAN, PT Acute Rehabilitation Services  Office [phone]   Macario SHAUNNA Soja 08/05/2024, 12:09 PM

## 2024-08-05 NOTE — ED Notes (Signed)
 PT walking with patient

## 2024-08-05 NOTE — ED Notes (Signed)
 Nt called ccmd@4 :18am

## 2024-08-06 LAB — BASIC METABOLIC PANEL WITH GFR
Anion gap: 9 (ref 5–15)
BUN: 15 mg/dL (ref 8–23)
CO2: 28 mmol/L (ref 22–32)
Calcium: 8.9 mg/dL (ref 8.9–10.3)
Chloride: 105 mmol/L (ref 98–111)
Creatinine, Ser: 0.91 mg/dL (ref 0.44–1.00)
GFR, Estimated: 60 mL/min — ABNORMAL LOW
Glucose, Bld: 100 mg/dL — ABNORMAL HIGH (ref 70–99)
Potassium: 3.3 mmol/L — ABNORMAL LOW (ref 3.5–5.1)
Sodium: 142 mmol/L (ref 135–145)

## 2024-08-06 LAB — GLUCOSE, CAPILLARY
Glucose-Capillary: 101 mg/dL — ABNORMAL HIGH (ref 70–99)
Glucose-Capillary: 129 mg/dL — ABNORMAL HIGH (ref 70–99)
Glucose-Capillary: 106 mg/dL — ABNORMAL HIGH (ref 70–99)
Glucose-Capillary: 116 mg/dL — ABNORMAL HIGH (ref 70–99)

## 2024-08-06 MED ORDER — SODIUM CHLORIDE 0.9 % IV SOLN: INTRAVENOUS | Status: AC | PRN

## 2024-08-06 MED ORDER — POTASSIUM CHLORIDE CRYS ER 20 MEQ PO TBCR
40.0000 meq | EXTENDED_RELEASE_TABLET | Freq: Once | ORAL | Status: AC
  Administered 2024-08-06: 40 meq via ORAL
  Filled 2024-08-06: qty 2

## 2024-08-06 NOTE — Progress Notes (Signed)
 "  PROGRESS NOTE    [name]  FMW:[id number] DOB: Apr 01, 1931 DOA: 08/04/2024 PCP: Gerome Brunet, DO   Brief Narrative: [name] is a 89 y.o. female with a history of dementia, CAD, PAD, hypertension, hyperlipidemia, diabetes mellitus type 2, GERD, arthritis.  Patient presented secondary to cough and congestion and found to have chest x-ray evidence of worsening pneumonia. IV antibiotics started. Transthoracic Echocardiogram consistent with fluid overload, suggesting acute heart failure.   Assessment/Plan:  Multifocal pneumonia Present on admission and noted on imaging. Patient previously started on treatment for pneumonia with Augmentin  and doxycycline . Patient stated on Vancomycin  and Cefepime  this admission. No blood cultures obtained. - Continue Vancomycin  and Cefepime  for now and transition to PO antibiotics in AM  Hypertensive urgency Present on admission. Possibly related to fluid overload. Hydralazine  given. Losartan  started, but with Transthoracic Echocardiogram results, will hold further scheduled antihypertensives, manage with as needed antihypertensives and treat fluid overload.  Acute on chronic HFpEF ProBNP is elevated. Patient was given Lasix  in the ED. Repeat Transthoracic Echocardiogram obtained this admission and is significant for an LVEF of 60-65% with grade II diastolic dysfunction. Small pericardial effusion is also present. Echo results suggest fluid overload. - Lasix  40 mg IV BID; likely transition to Lasix  PO in AM  Hypokalemia - Continue potassium supplementation  Prolonged QTc Last QTc of 508 on EKG. - Avoid QTc prolonging mediation  Likely demand ischemia Mildly elevated troponin of 25 with negative delta of 26. No chest pain.  CAD No chest pain.  Diabetes mellitus type 2 Well controlled based on a hemoglobin A1C of 6.2%. - Continue SSI  Dementia - Continue Namenda   PAD Noted.   DVT prophylaxis: Lovenox  Code Status:   Code  Status: Full Code Family Communication: None at bedside. Called daughter but no response Disposition Plan: Discharge home likely in 24 hours with home health services pending completion of IV diuresis and transition to oral antibiotics   Consultants:  None  Procedures:  Transthoracic Echocardiogram  Antimicrobials: Vancomycin  Cefepime     Subjective: No concerns this morning.  Objective: BP (!) 152/66 (BP Location: Right Arm)   Pulse 63   Temp 98.8 F (37.1 C) (Oral)   Resp 18   Ht 5' 7 (1.702 m)   Wt 51.8 kg   SpO2 98%   BMI 17.90 kg/m   Examination:  General exam: Appears calm and comfortable. Respiratory system: Clear to auscultation. Respiratory effort normal. Cardiovascular system: S1 & S2 heard, RRR.  Gastrointestinal system: Abdomen is nondistended, soft and nontender. Normal bowel sounds heard. Central nervous system: Alert and oriented. No focal neurological deficits. Psychiatry: Judgement and insight appear normal. Mood & affect appropriate.    Data Reviewed: I have personally reviewed following labs and imaging studies   Last CBC Lab Results  Component Value Date   WBC 4.0 08/05/2024   HGB 14.2 08/05/2024   HCT 41.5 08/05/2024   MCV 79.3 (L) 08/05/2024   MCH 27.2 08/05/2024   RDW 15.9 (H) 08/05/2024   PLT 184 08/05/2024     Last metabolic panel Lab Results  Component Value Date   GLUCOSE 100 (H) 08/06/2024   NA 142 08/06/2024   K 3.3 (L) 08/06/2024   CL 105 08/06/2024   CO2 28 08/06/2024   BUN 15 08/06/2024   CREATININE 0.91 08/06/2024   GFRNONAA 60 (L) 08/06/2024   CALCIUM  8.9 08/06/2024   PROT 6.3 (L) 08/05/2024   ALBUMIN 3.6 08/05/2024   BILITOT 0.9 08/05/2024   ALKPHOS 65 08/05/2024  AST 37 08/05/2024   ALT 24 08/05/2024   ANIONGAP 9 08/06/2024     Creatinine Clearance: Estimated Creatinine Clearance: 32.9 mL/min (by C-G formula based on SCr of 0.91 mg/dL).  Recent Results (from the past 240 hours)  Resp panel by RT-PCR  (RSV, Flu A&B, Covid) Anterior Nasal Swab     Status: None   Collection Time: 08/05/24  2:14 AM   Specimen: Anterior Nasal Swab  Result Value Ref Range Status   SARS Coronavirus 2 by RT PCR NEGATIVE NEGATIVE Final   Influenza A by PCR NEGATIVE NEGATIVE Final   Influenza B by PCR NEGATIVE NEGATIVE Final    Comment: (NOTE) The Xpert Xpress SARS-CoV-2/FLU/RSV plus assay is intended as an aid in the diagnosis of influenza from Nasopharyngeal swab specimens and should not be used as a sole basis for treatment. Nasal washings and aspirates are unacceptable for Xpert Xpress SARS-CoV-2/FLU/RSV testing.  Fact Sheet for Patients: bloggercourse.com  Fact Sheet for Healthcare Providers: seriousbroker.it  This test is not yet approved or cleared by the [place]  FDA and has been authorized for detection and/or diagnosis of SARS-CoV-2 by FDA under an Emergency Use Authorization (EUA). This EUA will remain in effect (meaning this test can be used) for the duration of the COVID-19 declaration under Section 564(b)(1) of the Act, 21 U.S.C. section 360bbb-3(b)(1), unless the authorization is terminated or revoked.     Resp Syncytial Virus by PCR NEGATIVE NEGATIVE Final    Comment: (NOTE) Fact Sheet for Patients: bloggercourse.com  Fact Sheet for Healthcare Providers: seriousbroker.it  This test is not yet approved or cleared by the [place]  FDA and has been authorized for detection and/or diagnosis of SARS-CoV-2 by FDA under an Emergency Use Authorization (EUA). This EUA will remain in effect (meaning this test can be used) for the duration of the COVID-19 declaration under Section 564(b)(1) of the Act, 21 U.S.C. section 360bbb-3(b)(1), unless the authorization is terminated or revoked.  Performed at [facility], [address] [place], [place] [address]   MRSA Next Gen by  PCR, Nasal     Status: None   Collection Time: 08/05/24  3:57 AM   Specimen: Nasopharyngeal Swab; Nasal Swab  Result Value Ref Range Status   MRSA by PCR Next Gen NOT DETECTED NOT DETECTED Final    Comment: (NOTE) The GeneXpert MRSA Assay (FDA approved for NASAL specimens only), is one component of a comprehensive MRSA colonization surveillance program. It is not intended to diagnose MRSA infection nor to guide or monitor treatment for MRSA infections. Test performance is not FDA approved in patients less than 23 years old. Performed at [facility], [address] [place], [place] [address]   Respiratory (~20 pathogens) panel by PCR     Status: None   Collection Time: 08/05/24  6:15 AM   Specimen: Nasopharyngeal Swab; Respiratory  Result Value Ref Range Status   Adenovirus NOT DETECTED NOT DETECTED Final   Coronavirus 229E NOT DETECTED NOT DETECTED Final    Comment: (NOTE) The Coronavirus on the Respiratory Panel, DOES NOT test for the novel  Coronavirus (2019 nCoV)    Coronavirus HKU1 NOT DETECTED NOT DETECTED Final   Coronavirus NL63 NOT DETECTED NOT DETECTED Final   Coronavirus OC43 NOT DETECTED NOT DETECTED Final   Metapneumovirus NOT DETECTED NOT DETECTED Final   Rhinovirus / Enterovirus NOT DETECTED NOT DETECTED Final   Influenza A NOT DETECTED NOT DETECTED Final   Influenza B NOT DETECTED NOT DETECTED Final  Parainfluenza Virus 1 NOT DETECTED NOT DETECTED Final   Parainfluenza Virus 2 NOT DETECTED NOT DETECTED Final   Parainfluenza Virus 3 NOT DETECTED NOT DETECTED Final   Parainfluenza Virus 4 NOT DETECTED NOT DETECTED Final   Respiratory Syncytial Virus NOT DETECTED NOT DETECTED Final   Bordetella pertussis NOT DETECTED NOT DETECTED Final   Bordetella Parapertussis NOT DETECTED NOT DETECTED Final   Chlamydophila pneumoniae NOT DETECTED NOT DETECTED Final   Mycoplasma pneumoniae NOT DETECTED NOT DETECTED Final    Comment: Performed at [facility],  [address], [place], [place] [address]      Radiology Studies: ECHOCARDIOGRAM COMPLETE Result Date: 08/05/2024    ECHOCARDIOGRAM REPORT   Patient Name:   [name] Date of Exam: 08/05/2024 Medical Rec #:  [id number]       Height:       67.0 in Accession #:    [id number]      Weight:       131.0 lb Date of Birth:  [birth year]       BSA:          1.689 m Patient Age:    89 years        BP:           199/77 mmHg Patient Gender: F               HR:           60 bpm. Exam Location:  Inpatient Procedure: 2D Echo, Cardiac Doppler and Color Doppler (Both Spectral and Color            Flow Doppler were utilized during procedure). Indications:    Elevated brain natriuretic peptide (BNP) level  History:        Patient has prior history of Echocardiogram examinations, most                 recent 03/22/2023. Risk Factors:Hypertension and Diabetes.  Sonographer:    Jayson Gaskins Referring Phys: [id number] VASUNDHRA RATHORE IMPRESSIONS  1. Left ventricular ejection fraction, by estimation, is 60 to 65%. The left ventricle has normal function. The left ventricle has no regional wall motion abnormalities. There is mild concentric left ventricular hypertrophy. Left ventricular diastolic parameters are consistent with Grade II diastolic dysfunction (pseudonormalization). The E/e' is 23.8.  2. Right ventricular systolic function is normal. The right ventricular size is normal. There is normal pulmonary artery systolic pressure.  3. Right atrial size was severely dilated.  4. A small pericardial effusion is present. The pericardial effusion is circumferential. There is no evidence of cardiac tamponade.  5. The mitral valve is degenerative. Mild mitral valve regurgitation. No evidence of mitral stenosis.  6. The aortic valve is tricuspid. Aortic valve regurgitation is mild. Aortic valve sclerosis is present, with no evidence of aortic valve stenosis.  7. The inferior vena cava is normal in size with <50% respiratory variability,  suggesting right atrial pressure of 8 mmHg. Comparison(s): Changes from prior study are noted. Right atrium is now severely dilated. Now grade II diastolic dysfunction. Conclusion(s)/Recommendation(s): Markedly elevated E/e' suggests volume overload. Recommend diuresis for volume overload. FINDINGS  Left Ventricle: Left ventricular ejection fraction, by estimation, is 60 to 65%. The left ventricle has normal function. The left ventricle has no regional wall motion abnormalities. The left ventricular internal cavity size was normal in size. There is  mild concentric left ventricular hypertrophy. Left ventricular diastolic parameters are consistent with Grade II diastolic dysfunction (pseudonormalization). The E/e' is 48.8.  Right Ventricle: The right ventricular size is normal. No increase in right ventricular wall thickness. Right ventricular systolic function is normal. There is normal pulmonary artery systolic pressure. The tricuspid regurgitant velocity is 2.07 m/s, and  with an assumed right atrial pressure of 8 mmHg, the estimated right ventricular systolic pressure is 25.1 mmHg. Left Atrium: Left atrial size was normal in size. Right Atrium: Right atrial size was severely dilated. Pericardium: A small pericardial effusion is present. The pericardial effusion is circumferential. There is no evidence of cardiac tamponade. Mitral Valve: The mitral valve is degenerative in appearance. There is moderate thickening of the mitral valve leaflet(s). Mild mitral valve regurgitation. No evidence of mitral valve stenosis. Tricuspid Valve: The tricuspid valve is normal in structure. Tricuspid valve regurgitation is mild . No evidence of tricuspid stenosis. Aortic Valve: The aortic valve is tricuspid. Aortic valve regurgitation is mild. Aortic valve sclerosis is present, with no evidence of aortic valve stenosis. Aortic valve mean gradient measures 7.0 mmHg. Aortic valve peak gradient measures 13.0 mmHg. Aortic valve area,  by VTI measures 2.06 cm. Pulmonic Valve: The pulmonic valve was not well visualized. Pulmonic valve regurgitation is trivial. No evidence of pulmonic stenosis. Aorta: The aortic root is normal in size and structure. Venous: The inferior vena cava is normal in size with less than 50% respiratory variability, suggesting right atrial pressure of 8 mmHg. IAS/Shunts: No atrial level shunt detected by color flow Doppler.  LEFT VENTRICLE PLAX 2D LVIDd:         4.90 cm   Diastology LVIDs:         3.60 cm   LV e' medial:    3.70 cm/s LV PW:         1.10 cm   LV E/e' medial:  30.5 LV IVS:        1.00 cm   LV e' lateral:   6.64 cm/s LVOT diam:     1.90 cm   LV E/e' lateral: 17.0 LV SV:         83 LV SV Index:   49 LVOT Area:     2.84 cm  RIGHT VENTRICLE RV S prime:     11.90 cm/s TAPSE (M-mode): 2.6 cm LEFT ATRIUM             Index        RIGHT ATRIUM           Index LA Vol (A2C):   64.3 ml 38.07 ml/m  RA Area:     26.70 cm LA Vol (A4C):   46.4 ml 27.47 ml/m  RA Volume:   95.20 ml  56.36 ml/m LA Biplane Vol: 57.0 ml 33.74 ml/m  AORTIC VALVE AV Area (Vmax):    2.19 cm AV Area (Vmean):   2.15 cm AV Area (VTI):     2.06 cm AV Vmax:           180.00 cm/s AV Vmean:          121.000 cm/s AV VTI:            0.401 m AV Peak Grad:      13.0 mmHg AV Mean Grad:      7.0 mmHg LVOT Vmax:         139.00 cm/s LVOT Vmean:        91.600 cm/s LVOT VTI:          0.292 m LVOT/AV VTI ratio: 0.73  AORTA Ao Root diam: 2.70 cm MITRAL VALVE  TRICUSPID VALVE MV Area (PHT): 2.50 cm     TR Peak grad:   17.1 mmHg MV Decel Time: 303 msec     TR Vmax:        207.00 cm/s MV E velocity: 113.00 cm/s MV A velocity: 110.00 cm/s  SHUNTS MV E/A ratio:  1.03         Systemic VTI:  0.29 m                             Systemic Diam: 1.90 cm Georganna Archer Electronically signed by Georganna Archer Signature Date/Time: 08/05/2024/2:34:27 PM    Final    DG Chest 1 View Result Date: 08/05/2024 EXAM: 1 VIEW(S) XRAY OF THE CHEST 08/04/2024 11:32:00  PM COMPARISON: Comparison with 07/30/2024. CLINICAL HISTORY: Cough. FINDINGS: LUNGS AND PLEURA: Emphysematous changes in the lungs. Patchy areas of infiltration in both lungs. This may represent multifocal pneumonia. Chronic central interstitial changes are also suggested. No pleural effusion. No pneumothorax. HEART AND MEDIASTINUM: Mild cardiac enlargement. No vascular congestion. Mediastinal contours appear intact. BONES AND SOFT TISSUES: Degenerative changes in the spine and shoulders. No acute osseous abnormality. IMPRESSION: 1. Emphysematous changes in the lungs with patchy areas of infiltration in both lungs, possibly representing multifocal pneumonia. 2. Mild cardiac enlargement. No vascular congestion. Electronically signed by: Elsie Gravely MD 08/05/2024 12:08 AM EST RP Workstation: [id number]      LOS: 1 day    Elgin Lam, MD [facility] 08/06/2024, 12:41 PM   If 7PM-7AM, please contact night-coverage [email or web address]  "

## 2024-08-07 ENCOUNTER — Other Ambulatory Visit (HOSPITAL_COMMUNITY): Payer: Self-pay

## 2024-08-07 ENCOUNTER — Telehealth (HOSPITAL_COMMUNITY): Payer: Self-pay

## 2024-08-07 LAB — BASIC METABOLIC PANEL WITH GFR
Anion gap: 11 (ref 5–15)
BUN: 17 mg/dL (ref 8–23)
CO2: 28 mmol/L (ref 22–32)
Calcium: 9.3 mg/dL (ref 8.9–10.3)
Chloride: 100 mmol/L (ref 98–111)
Creatinine, Ser: 1.05 mg/dL — ABNORMAL HIGH (ref 0.44–1.00)
GFR, Estimated: 50 mL/min — ABNORMAL LOW
Glucose, Bld: 83 mg/dL (ref 70–99)
Potassium: 3.3 mmol/L — ABNORMAL LOW (ref 3.5–5.1)
Sodium: 138 mmol/L (ref 135–145)

## 2024-08-07 LAB — GLUCOSE, CAPILLARY: Glucose-Capillary: 94 mg/dL (ref 70–99)

## 2024-08-07 MED ORDER — AMOXICILLIN-POT CLAVULANATE 500-125 MG PO TABS: 1.0000 | ORAL_TABLET | Freq: Two times a day (BID) | ORAL | Status: AC

## 2024-08-07 MED ORDER — DOXYCYCLINE HYCLATE 100 MG PO CAPS: 100.0000 mg | ORAL_CAPSULE | Freq: Two times a day (BID) | ORAL | Status: AC

## 2024-08-07 MED ORDER — ENOXAPARIN SODIUM 30 MG/0.3ML IJ SOSY
30.0000 mg | PREFILLED_SYRINGE | INTRAMUSCULAR | Status: DC
  Administered 2024-08-07: 30 mg via SUBCUTANEOUS
  Filled 2024-08-07: qty 0.3

## 2024-08-07 MED ORDER — POTASSIUM CHLORIDE CRYS ER 20 MEQ PO TBCR
40.0000 meq | EXTENDED_RELEASE_TABLET | Freq: Once | ORAL | Status: AC
  Administered 2024-08-07: 40 meq via ORAL
  Filled 2024-08-07: qty 2

## 2024-08-07 MED ORDER — LOSARTAN POTASSIUM 25 MG PO TABS
25.0000 mg | ORAL_TABLET | Freq: Every day | ORAL | 0 refills | Status: AC
  Filled 2024-08-07: qty 30, 30d supply, fill #0

## 2024-08-07 NOTE — Discharge Summary (Signed)
 " Physician Discharge Summary   Patient: [name] MRN: [id number] DOB: 1931/05/08  Admit date:     08/04/2024  Discharge date: 08/07/2024  Discharge Physician: Elgin Lam, MD   PCP: Gerome Brunet, DO   Recommendations at discharge:  PCP visit for hospital follow-up  Discharge Diagnoses: Principal Problem:   Multifocal pneumonia Active Problems:   QT prolongation   Hypertensive urgency   Hypokalemia   Elevated brain natriuretic peptide (BNP) level  Resolved Problems:   * No resolved hospital problems. *  Hospital Course: [name] is a 89 y.o. female with a history of dementia, CAD, PAD, hypertension, hyperlipidemia, diabetes mellitus type 2, GERD, arthritis.  Patient presented secondary to cough and congestion and found to have chest x-ray evidence of worsening pneumonia. IV antibiotics started. Transthoracic Echocardiogram consistent with fluid overload, suggesting acute heart failure. Patient was given Lasix  IV for management. Patient stable for discharge home.  Assessment and Plan:  Multifocal pneumonia Present on admission and noted on imaging. Patient previously started on treatment for pneumonia with Augmentin  and doxycycline . Patient stated on Vancomycin  and Cefepime  this admission. No blood cultures obtained. - Continue Vancomycin  and Cefepime  for now and transition to PO antibiotics in AM   Hypertensive urgency Present on admission. Possibly related to fluid overload. Hydralazine  given. Losartan  started, but with Transthoracic Echocardiogram results, will hold further scheduled antihypertensives, manage with as needed antihypertensives and treat fluid overload.   Acute on chronic HFpEF ProBNP is elevated. Patient was given Lasix  in the ED. Repeat Transthoracic Echocardiogram obtained this admission and is significant for an LVEF of 60-65% with grade II diastolic dysfunction. Small pericardial effusion is also present. Echo results suggest fluid overload. Patient  treated with Lasix  IV. Outpatient follow-up.   Hypokalemia Managed with potassium supplementation. Likely related to Lasix .   Prolonged QTc Last QTc of 508 on EKG. Noted.    Likely demand ischemia Mildly elevated troponin of 25 with negative delta of 26. No chest pain.   CAD No chest pain.   Diabetes mellitus type 2 Well controlled based on a hemoglobin A1C of 6.2%.   Dementia Continue Namenda .   PAD Noted.   Consultants:  None   Procedures:  Transthoracic Echocardiogram  Disposition: Home Diet recommendation: Regular diet   DISCHARGE MEDICATION: Allergies as of 08/07/2024   No Known Allergies      Medication List     TAKE these medications    amoxicillin -clavulanate 500-125 MG tablet Commonly known as: Augmentin  Take 1 tablet by mouth in the morning and at bedtime for 5 days.   benzonatate  100 MG capsule Commonly known as: TESSALON  Take 1 capsule (100 mg total) by mouth every 8 (eight) hours.   doxycycline  100 MG capsule Commonly known as: VIBRAMYCIN  Take 1 capsule (100 mg total) by mouth 2 (two) times daily for 5 days.   loratadine 10 MG tablet Commonly known as: CLARITIN Take 10 mg by mouth daily as needed (cough and congestion).   losartan  25 MG tablet Commonly known as: COZAAR  Take 1 tablet (25 mg total) by mouth daily. Start taking on: August 08, 2024   memantine  10 MG tablet Commonly known as: NAMENDA  Take 10 mg by mouth 2 (two) times daily.   metoprolol  succinate 25 MG 24 hr tablet Commonly known as: TOPROL -XL Take 0.5 tablets (12.5 mg total) by mouth daily.   QUEtiapine  25 MG tablet Commonly known as: SEROQUEL  Take 25 mg by mouth at bedtime.        Follow-up Information  Gerome Brunet, DO. Schedule an appointment as soon as possible for a visit in 1 week(s).   Specialty: Family Medicine Why: For hospital follow-up Contact information: [address] [place] [address] [phone]                 Discharge Exam: BP (!) 132/58 (BP Location: Left Arm)   Pulse (!) 52   Temp 98.1 F (36.7 C) (Oral)   Resp 16   Ht 5' 7 (1.702 m)   Wt 51.8 kg   SpO2 95%   BMI 17.89 kg/m   General exam: Appears calm and comfortable. Respiratory system: Clear to auscultation. Respiratory effort normal. Cardiovascular system: S1 & S2 heard, RRR.  Gastrointestinal system: Abdomen is nondistended, soft and nontender. Normal bowel sounds heard. Central nervous system: Alert and oriented. No focal neurological deficits. Musculoskeletal: No edema. No calf tenderness Psychiatry: Judgement and insight appear normal. Mood & affect appropriate.   Condition at discharge: stable  The results of significant diagnostics from this hospitalization (including imaging, microbiology, ancillary and laboratory) are listed below for reference.   Imaging Studies: ECHOCARDIOGRAM COMPLETE Result Date: 08/05/2024    ECHOCARDIOGRAM REPORT   Patient Name:   [name] Date of Exam: 08/05/2024 Medical Rec #:  [id number]       Height:       67.0 in Accession #:    [id number]      Weight:       131.0 lb Date of Birth:  08/02/1931       BSA:          1.689 m Patient Age:    89 years        BP:           199/77 mmHg Patient Gender: F               HR:           60 bpm. Exam Location:  Inpatient Procedure: 2D Echo, Cardiac Doppler and Color Doppler (Both Spectral and Color            Flow Doppler were utilized during procedure). Indications:    Elevated brain natriuretic peptide (BNP) level  History:        Patient has prior history of Echocardiogram examinations, most                 recent 03/22/2023. Risk Factors:Hypertension and Diabetes.  Sonographer:    Jayson Gaskins Referring Phys: [id number] VASUNDHRA RATHORE IMPRESSIONS  1. Left ventricular ejection fraction, by estimation, is 60 to 65%. The left ventricle has normal function. The left ventricle has no regional wall motion abnormalities. There is mild concentric left ventricular  hypertrophy. Left ventricular diastolic parameters are consistent with Grade II diastolic dysfunction (pseudonormalization). The E/e' is 23.8.  2. Right ventricular systolic function is normal. The right ventricular size is normal. There is normal pulmonary artery systolic pressure.  3. Right atrial size was severely dilated.  4. A small pericardial effusion is present. The pericardial effusion is circumferential. There is no evidence of cardiac tamponade.  5. The mitral valve is degenerative. Mild mitral valve regurgitation. No evidence of mitral stenosis.  6. The aortic valve is tricuspid. Aortic valve regurgitation is mild. Aortic valve sclerosis is present, with no evidence of aortic valve stenosis.  7. The inferior vena cava is normal in size with <50% respiratory variability, suggesting right atrial pressure of 8 mmHg. Comparison(s): Changes from prior study are noted. Right atrium is now  severely dilated. Now grade II diastolic dysfunction. Conclusion(s)/Recommendation(s): Markedly elevated E/e' suggests volume overload. Recommend diuresis for volume overload. FINDINGS  Left Ventricle: Left ventricular ejection fraction, by estimation, is 60 to 65%. The left ventricle has normal function. The left ventricle has no regional wall motion abnormalities. The left ventricular internal cavity size was normal in size. There is  mild concentric left ventricular hypertrophy. Left ventricular diastolic parameters are consistent with Grade II diastolic dysfunction (pseudonormalization). The E/e' is 23.8. Right Ventricle: The right ventricular size is normal. No increase in right ventricular wall thickness. Right ventricular systolic function is normal. There is normal pulmonary artery systolic pressure. The tricuspid regurgitant velocity is 2.07 m/s, and  with an assumed right atrial pressure of 8 mmHg, the estimated right ventricular systolic pressure is 25.1 mmHg. Left Atrium: Left atrial size was normal in size. Right  Atrium: Right atrial size was severely dilated. Pericardium: A small pericardial effusion is present. The pericardial effusion is circumferential. There is no evidence of cardiac tamponade. Mitral Valve: The mitral valve is degenerative in appearance. There is moderate thickening of the mitral valve leaflet(s). Mild mitral valve regurgitation. No evidence of mitral valve stenosis. Tricuspid Valve: The tricuspid valve is normal in structure. Tricuspid valve regurgitation is mild . No evidence of tricuspid stenosis. Aortic Valve: The aortic valve is tricuspid. Aortic valve regurgitation is mild. Aortic valve sclerosis is present, with no evidence of aortic valve stenosis. Aortic valve mean gradient measures 7.0 mmHg. Aortic valve peak gradient measures 13.0 mmHg. Aortic valve area, by VTI measures 2.06 cm. Pulmonic Valve: The pulmonic valve was not well visualized. Pulmonic valve regurgitation is trivial. No evidence of pulmonic stenosis. Aorta: The aortic root is normal in size and structure. Venous: The inferior vena cava is normal in size with less than 50% respiratory variability, suggesting right atrial pressure of 8 mmHg. IAS/Shunts: No atrial level shunt detected by color flow Doppler.  LEFT VENTRICLE PLAX 2D LVIDd:         4.90 cm   Diastology LVIDs:         3.60 cm   LV e' medial:    3.70 cm/s LV PW:         1.10 cm   LV E/e' medial:  30.5 LV IVS:        1.00 cm   LV e' lateral:   6.64 cm/s LVOT diam:     1.90 cm   LV E/e' lateral: 17.0 LV SV:         83 LV SV Index:   49 LVOT Area:     2.84 cm  RIGHT VENTRICLE RV S prime:     11.90 cm/s TAPSE (M-mode): 2.6 cm LEFT ATRIUM             Index        RIGHT ATRIUM           Index LA Vol (A2C):   64.3 ml 38.07 ml/m  RA Area:     26.70 cm LA Vol (A4C):   46.4 ml 27.47 ml/m  RA Volume:   95.20 ml  56.36 ml/m LA Biplane Vol: 57.0 ml 33.74 ml/m  AORTIC VALVE AV Area (Vmax):    2.19 cm AV Area (Vmean):   2.15 cm AV Area (VTI):     2.06 cm AV Vmax:            180.00 cm/s AV Vmean:          121.000 cm/s AV VTI:  0.401 m AV Peak Grad:      13.0 mmHg AV Mean Grad:      7.0 mmHg LVOT Vmax:         139.00 cm/s LVOT Vmean:        91.600 cm/s LVOT VTI:          0.292 m LVOT/AV VTI ratio: 0.73  AORTA Ao Root diam: 2.70 cm MITRAL VALVE                TRICUSPID VALVE MV Area (PHT): 2.50 cm     TR Peak grad:   17.1 mmHg MV Decel Time: 303 msec     TR Vmax:        207.00 cm/s MV E velocity: 113.00 cm/s MV A velocity: 110.00 cm/s  SHUNTS MV E/A ratio:  1.03         Systemic VTI:  0.29 m                             Systemic Diam: 1.90 cm Georganna Archer Electronically signed by Georganna Archer Signature Date/Time: 08/05/2024/2:34:27 PM    Final    DG Chest 1 View Result Date: 08/05/2024 EXAM: 1 VIEW(S) XRAY OF THE CHEST 08/04/2024 11:32:00 PM COMPARISON: Comparison with 07/30/2024. CLINICAL HISTORY: Cough. FINDINGS: LUNGS AND PLEURA: Emphysematous changes in the lungs. Patchy areas of infiltration in both lungs. This may represent multifocal pneumonia. Chronic central interstitial changes are also suggested. No pleural effusion. No pneumothorax. HEART AND MEDIASTINUM: Mild cardiac enlargement. No vascular congestion. Mediastinal contours appear intact. BONES AND SOFT TISSUES: Degenerative changes in the spine and shoulders. No acute osseous abnormality. IMPRESSION: 1. Emphysematous changes in the lungs with patchy areas of infiltration in both lungs, possibly representing multifocal pneumonia. 2. Mild cardiac enlargement. No vascular congestion. Electronically signed by: Elsie Gravely MD 08/05/2024 12:08 AM EST RP Workstation: [id number]   DG Chest 2 View Result Date: 07/30/2024 EXAM: 2 VIEW(S) XRAY OF THE CHEST 07/30/2024 07:55:06 PM COMPARISON: 03/21/2023 chest x-ray, CT angio chest 8:19:24 CLINICAL HISTORY: cough and chest congestion FINDINGS: LUNGS AND PLEURA: Lung hyperinflation. Possible right lower lobe airspace opacity. Chronic coarsened interstitial markings.  Possible trace right pleural effusion. No pneumothorax. HEART AND MEDIASTINUM: Atherosclerotic plaque. No acute abnormality of the cardiac and mediastinal silhouettes. BONES AND SOFT TISSUES: No acute osseous abnormality. IMPRESSION: 1. Possible right lower lobe airspace opacity with trace right pleural effusion. 2. Lung hyperinflation. Electronically signed by: Morgane Naveau MD 07/30/2024 08:31 PM EST RP Workstation: [id number]    Microbiology: Results for orders placed or performed during the hospital encounter of 08/04/24  Resp panel by RT-PCR (RSV, Flu A&B, Covid) Anterior Nasal Swab     Status: None   Collection Time: 08/05/24  2:14 AM   Specimen: Anterior Nasal Swab  Result Value Ref Range Status   SARS Coronavirus 2 by RT PCR NEGATIVE NEGATIVE Final   Influenza A by PCR NEGATIVE NEGATIVE Final   Influenza B by PCR NEGATIVE NEGATIVE Final    Comment: (NOTE) The Xpert Xpress SARS-CoV-2/FLU/RSV plus assay is intended as an aid in the diagnosis of influenza from Nasopharyngeal swab specimens and should not be used as a sole basis for treatment. Nasal washings and aspirates are unacceptable for Xpert Xpress SARS-CoV-2/FLU/RSV testing.  Fact Sheet for Patients: bloggercourse.com  Fact Sheet for Healthcare Providers: seriousbroker.it  This test is not yet approved or cleared by the [place]  FDA and has been authorized for detection and/or  diagnosis of SARS-CoV-2 by FDA under an Emergency Use Authorization (EUA). This EUA will remain in effect (meaning this test can be used) for the duration of the COVID-19 declaration under Section 564(b)(1) of the Act, 21 U.S.C. section 360bbb-3(b)(1), unless the authorization is terminated or revoked.     Resp Syncytial Virus by PCR NEGATIVE NEGATIVE Final    Comment: (NOTE) Fact Sheet for Patients: bloggercourse.com  Fact Sheet for Healthcare  Providers: seriousbroker.it  This test is not yet approved or cleared by the [place]  FDA and has been authorized for detection and/or diagnosis of SARS-CoV-2 by FDA under an Emergency Use Authorization (EUA). This EUA will remain in effect (meaning this test can be used) for the duration of the COVID-19 declaration under Section 564(b)(1) of the Act, 21 U.S.C. section 360bbb-3(b)(1), unless the authorization is terminated or revoked.  Performed at [facility], [address] [place], [place] [address]   MRSA Next Gen by PCR, Nasal     Status: None   Collection Time: 08/05/24  3:57 AM   Specimen: Nasopharyngeal Swab; Nasal Swab  Result Value Ref Range Status   MRSA by PCR Next Gen NOT DETECTED NOT DETECTED Final    Comment: (NOTE) The GeneXpert MRSA Assay (FDA approved for NASAL specimens only), is one component of a comprehensive MRSA colonization surveillance program. It is not intended to diagnose MRSA infection nor to guide or monitor treatment for MRSA infections. Test performance is not FDA approved in patients less than 61 years old. Performed at [facility], [address], [place], [place] [address]   Respiratory (~20 pathogens) panel by PCR     Status: None   Collection Time: 08/05/24  6:15 AM   Specimen: Nasopharyngeal Swab; Respiratory  Result Value Ref Range Status   Adenovirus NOT DETECTED NOT DETECTED Final   Coronavirus 229E NOT DETECTED NOT DETECTED Final    Comment: (NOTE) The Coronavirus on the Respiratory Panel, DOES NOT test for the novel  Coronavirus (2019 nCoV)    Coronavirus HKU1 NOT DETECTED NOT DETECTED Final   Coronavirus NL63 NOT DETECTED NOT DETECTED Final   Coronavirus OC43 NOT DETECTED NOT DETECTED Final   Metapneumovirus NOT DETECTED NOT DETECTED Final   Rhinovirus / Enterovirus NOT DETECTED NOT DETECTED Final   Influenza A NOT DETECTED NOT DETECTED Final   Influenza B NOT DETECTED NOT DETECTED  Final   Parainfluenza Virus 1 NOT DETECTED NOT DETECTED Final   Parainfluenza Virus 2 NOT DETECTED NOT DETECTED Final   Parainfluenza Virus 3 NOT DETECTED NOT DETECTED Final   Parainfluenza Virus 4 NOT DETECTED NOT DETECTED Final   Respiratory Syncytial Virus NOT DETECTED NOT DETECTED Final   Bordetella pertussis NOT DETECTED NOT DETECTED Final   Bordetella Parapertussis NOT DETECTED NOT DETECTED Final   Chlamydophila pneumoniae NOT DETECTED NOT DETECTED Final   Mycoplasma pneumoniae NOT DETECTED NOT DETECTED Final    Comment: Performed at [facility], [address] [place], [place] [address]    Labs: CBC: Recent Labs  Lab 08/05/24 0002 08/05/24 0424  WBC 3.7* 4.0  NEUTROABS 1.5*  --   HGB 13.6 14.2  HCT 40.0 41.5  MCV 80.6 79.3*  PLT 164 184   Basic Metabolic Panel: Recent Labs  Lab 08/05/24 0002 08/05/24 0424 08/05/24 0934 08/06/24 0748 08/07/24 0515  NA 140 139  --  142 138  K 2.8* 5.9* 3.3* 3.3* 3.3*  CL 104 103  --  105 100  CO2 27  28  --  28 28  GLUCOSE 118* 110*  --  100* 83  BUN 12 11  --  15 17  CREATININE 0.84 0.86  --  0.91 1.05*  CALCIUM  8.8* 8.9  --  8.9 9.3  MG  --  1.7  --   --   --    Liver Function Tests: Recent Labs  Lab 08/05/24 0424  AST 37  ALT 24  ALKPHOS 65  BILITOT 0.9  PROT 6.3*  ALBUMIN 3.6   CBG: Recent Labs  Lab 08/06/24 0804 08/06/24 1207 08/06/24 1627 08/06/24 2151 08/07/24 0747  GLUCAP 101* 129* 106* 116* 94    Discharge time spent: 35 minutes.  Signed: Elgin Lam, MD [facility] 08/07/2024 "

## 2024-08-07 NOTE — Discharge Instructions (Addendum)
 [name],  You were in the hospital with concern for worsening pneumonia. It seems you may have had some fluid buildup versus worsening pneumonia on imaging. Thankfully, you were doing pretty well. You were given continued antibiotics and also given a medication called Lasix  to remove some excess fluid. Please follow-up with your PCP and continue your antibiotics (I have changed the duration of treatment).

## 2024-08-07 NOTE — Plan of Care (Signed)

## 2024-08-07 NOTE — TOC CM/SW Note (Signed)
 Transition of Care [facility]) - Inpatient Brief Assessment   Patient Details  Name: [name] MRN: [id number] Date of Birth: 08-20-1930  [facility]) CM/SW Contact:    Sudie Erminio Deems, RN Phone Number: 08/07/2024, 12:15 PM   Clinical Narrative: Patient presented for worsening pneumonia. PTA patient was from home with daughters support. Patient has DME cane. Patient has insurance and PCP- daughter takes patient to appointments. PT/OT consulted and recommends Outpatient PT-ambulatory referral submitted and the office will call with a visit time. Daughter will provide transportation home. No further needs identified at this time.    Transition of Care Asessment: Insurance and Status: Insurance coverage has been reviewed Patient has primary care physician: Yes Home environment has been reviewed: reviewed Prior level of function:: independent with assistance of cane Prior/Current Home Services: No current home services Social Drivers of Health Review: SDOH reviewed no interventions necessary Readmission risk has been reviewed: Yes Transition of care needs: no transition of care needs at this time

## 2024-08-07 NOTE — Hospital Course (Signed)
 [name] is a 89 y.o. female with a history of dementia, CAD, PAD, hypertension, hyperlipidemia, diabetes mellitus type 2, GERD, arthritis.  Patient presented secondary to cough and congestion and found to have chest x-ray evidence of worsening pneumonia. IV antibiotics started. Transthoracic Echocardiogram consistent with fluid overload, suggesting acute heart failure. Patient was given Lasix  IV for management. Patient stable for discharge home.

## 2024-08-07 NOTE — Telephone Encounter (Signed)
 Pts daughter [name] called with concerns her mom's vital signs were not on her [facility] discharge paper work. States pt is currently admitted and wanted to know what they were. All questions answered.

## 2024-08-07 NOTE — Progress Notes (Signed)
 Occupational Therapy Treatment Patient Details Name: [name] MRN: [id number] DOB: 1930-12-04 Today's Date: 08/07/2024   History of present illness 89 y.o. female presented 08/04/24 in follow-up of recent pneumonia diagnosis (could not get in with PCP). BP 230/98; +multifocal pna; PMH significant of dementia, CAD, PVD, hypertension, hyperlipidemia, type 2 diabetes, GERD, arthritis.   OT comments  Pt progressing toward established OT goals. Per RN, prepping for discharge soon. Printed and reviewed fall precaution handout with pt reporting she has not fallen, however, open to education and review of handout. Limited carryover noted. Able to get self dressed with set-up to CGA with good tolerance; no rest break required. Continue to recommend discharge home with no OT follow up at this time.       If plan is discharge home, recommend the following:  A little help with bathing/dressing/bathroom;Assistance with cooking/housework;A little help with walking and/or transfers   Equipment Recommendations  None recommended by OT    Recommendations for Other Services      Precautions / Restrictions Precautions Precautions: Fall Recall of Precautions/Restrictions: Intact Restrictions Weight Bearing Restrictions Per Provider Order: No       Mobility Bed Mobility Overal bed mobility: Modified Independent                  Transfers Overall transfer level: Needs assistance Equipment used: Quad cane Transfers: Sit to/from Stand Sit to Stand: Contact guard assist                 Balance Overall balance assessment: Needs assistance Sitting-balance support: No upper extremity supported, Feet unsupported Sitting balance-Leahy Scale: Good     Standing balance support: Single extremity supported Standing balance-Leahy Scale: Poor Standing balance comment: Requires UE support via SBQC                           ADL either performed or assessed with clinical  judgement   ADL Overall ADL's : Needs assistance/impaired                 Upper Body Dressing : Set up;Sitting   Lower Body Dressing: Contact guard assist;Sit to/from stand               Functional mobility during ADLs: Contact guard assist;Rolling walker (2 wheels)      Extremity/Trunk Assessment Upper Extremity Assessment Upper Extremity Assessment: Generalized weakness (but functional)   Lower Extremity Assessment Lower Extremity Assessment: Defer to PT evaluation        Vision       Perception     Praxis     Communication Communication Communication: Impaired Factors Affecting Communication: Hearing impaired   Cognition Arousal: Alert Behavior During Therapy: WFL for tasks assessed/performed Cognition: History of cognitive impairments (most likely at baseline)                               Following commands: Intact        Cueing   Cueing Techniques: Verbal cues, Gestural cues  Exercises      Shoulder Instructions       General Comments      Pertinent Vitals/ Pain       Pain Assessment Pain Assessment: No/denies pain  Home Living  Prior Functioning/Environment              Frequency  Min 2X/week        Progress Toward Goals  OT Goals(current goals can now be found in the care plan section)  Progress towards OT goals: Progressing toward goals  Acute Rehab OT Goals OT Goal Formulation: With patient Time For Goal Achievement: 08/19/24 Potential to Achieve Goals: Good ADL Goals Pt Will Transfer to Toilet: with modified independence;ambulating Pt Will Perform Toileting - Clothing Manipulation and hygiene: with modified independence;sit to/from stand Additional ADL Goal #1: pt/caregiver will verbalize 3 strategies to reduce risk of falls  Plan      Co-evaluation                 AM-PAC OT 6 Clicks Daily Activity     Outcome Measure    Help from another person eating meals?: None Help from another person taking care of personal grooming?: A Little Help from another person toileting, which includes using toliet, bedpan, or urinal?: A Little Help from another person bathing (including washing, rinsing, drying)?: A Little Help from another person to put on and taking off regular upper body clothing?: A Little Help from another person to put on and taking off regular lower body clothing?: A Little 6 Click Score: 19    End of Session Equipment Utilized During Treatment: Gait belt;Other (comment) [facility])  OT Visit Diagnosis: Unsteadiness on feet (R26.81)   Activity Tolerance Patient tolerated treatment well   Patient Left in bed;with call bell/phone within reach   Nurse Communication Mobility status        Time: 8859-8797 OT Time Calculation (min): 22 min  Charges: OT General Charges $OT Visit: 1 Visit OT Treatments $Self Care/Home Management : 8-22 mins  Elma JONETTA Lebron FREDERICK, OTR/L [facility] Acute Rehabilitation Office: [phone]   Elma JONETTA Lebron 08/07/2024, 12:16 PM

## 2024-08-08 ENCOUNTER — Telehealth: Payer: Self-pay

## 2024-08-08 NOTE — Transitions of Care (Post Inpatient/ED Visit) (Signed)
 "  08/08/2024  Name: [name] MRN: [id number] DOB: 08-01-1931  Today's TOC FU Call Status: Today's TOC FU Call Status:: Successful TOC FU Call Completed TOC FU Call Complete Date: 08/08/24  Patient's Name and Date of Birth confirmed. Name, DOB  Transition Care Management Follow-up Telephone Call Date of Discharge: 08/07/24 Discharge Facility: [name]) Type of Discharge: Inpatient Admission Primary Inpatient Discharge Diagnosis:: Multifocal pneumonia How have you been since you were released from the hospital?: Better Any questions or concerns?: No  Items Reviewed: Did you receive and understand the discharge instructions provided?: Yes Medications obtained,verified, and reconciled?: Yes (Medications Reviewed) Any new allergies since your discharge?: No Dietary orders reviewed?: Yes Type of Diet Ordered:: Heart Healthy Do you have support at home?: Yes People in Home [RPT]: child(ren), adult Name of Support/Comfort Primary Source: [name]  Medications Reviewed Today: Medications Reviewed Today     Reviewed by Sierra Casalino, RN (Case Manager) on 08/08/24 at 1221  Med List Status: <None>   Medication Order Taking? Sig Documenting Provider Last Dose Status Informant  amoxicillin -clavulanate (AUGMENTIN ) 500-125 MG tablet [id number] Yes Take 1 tablet by mouth in the morning and at bedtime for 5 days. Briana Elgin LABOR, MD  Active   benzonatate  (TESSALON ) 100 MG capsule [id number] Yes Take 1 capsule (100 mg total) by mouth every 8 (eight) hours. Sherrell Rocky POUR, PA-C  Active Pharmacy Records, Child  doxycycline  (VIBRAMYCIN ) 100 MG capsule [id number] Yes Take 1 capsule (100 mg total) by mouth 2 (two) times daily for 5 days. Briana Elgin LABOR, MD  Active   loratadine (CLARITIN) 10 MG tablet [id number] Yes Take 10 mg by mouth daily as needed (cough and congestion). [provider]  Active Child, Pharmacy Records  losartan  (COZAAR ) 25 MG tablet [id number] Yes Take 1 tablet (25 mg  total) by mouth daily. Briana Elgin LABOR, MD  Active   memantine  (NAMENDA ) 10 MG tablet [id number] Yes Take 10 mg by mouth 2 (two) times daily. [provider]  Active Pharmacy Records, Child  metoprolol  succinate (TOPROL -XL) 25 MG 24 hr tablet [id number] Yes Take 0.5 tablets (12.5 mg total) by mouth daily. West, Katlyn D, NP  Active Child, Pharmacy Records  QUEtiapine  (SEROQUEL ) 25 MG tablet [id number] Yes Take 25 mg by mouth at bedtime. [provider]  Active Pharmacy Records, Child            Home Care and Equipment/Supplies: Were Home Health Services Ordered?: NA Any new equipment or medical supplies ordered?: NA  Functional Questionnaire: Do you need assistance with bathing/showering or dressing?: Yes (daughter assists) Do you need assistance with meal preparation?: Yes (daughter prepares meals) Do you need assistance with eating?: No Do you have difficulty maintaining continence: No Do you need assistance with getting out of bed/getting out of a chair/moving?: No Do you have difficulty managing or taking your medications?: Yes (daughter manages)  Follow up appointments reviewed: PCP Follow-up appointment confirmed?: Yes Date of PCP follow-up appointment?: 08/14/24 Follow-up Provider: [facility] Follow-up appointment confirmed?: NA Do you need transportation to your follow-up appointment?: No Do you understand care options if your condition(s) worsen?: Yes-patient verbalized understanding  SDOH Interventions Today    Flowsheet Row Most Recent Value  SDOH Interventions   Food Insecurity Interventions Intervention Not Indicated  Housing Interventions Intervention Not Indicated  Transportation Interventions Intervention Not Indicated  Utilities Interventions Intervention Not Indicated   Discussed and offered 30 day TOC program.  Patient  agreeable  .  The patient has  been provided with contact information for the care management team and  has been advised to call with any health -related questions or concerns.  The patient verbalized understanding with current plan of care.  The patient is directed to their insurance card regarding availability of benefits coverage.    [name] J. Starsky Nanna RN, MSN [facility], [facility] RN Care Manager Direct Dial: [phone]  Fax: [phone] Website: [email or web address]   "

## 2024-08-08 NOTE — Patient Instructions (Signed)
 Visit Information  Thank you for taking time to visit with me today. Please don't hesitate to contact me if I can be of assistance to you before our next scheduled telephone appointment.  Our next appointment is by telephone on 08/15/24 at 100 pm  Following is a copy of your care plan:   Goals Addressed             This Visit's Progress    VBCI Transitions of Care (TOC) Care Plan       Problems:  Recent Hospitalization for treatment of Pneumonia Knowledge Deficit Related to Pneumonia  Goal:  Over the next 30 days, the patient will not experience hospital readmission  Interventions:  Transitions of Care: Doctor Visits  - discussed the importance of doctor visits Communication with PCP re: Enrollment in 30 day TOC program Reviewed Signs and symptoms of infection Reviewed discharge summary  Patient Self Care Activities:  Attend all scheduled provider appointments Call pharmacy for medication refills 3-7 days in advance of running out of medications Call provider office for new concerns or questions  Notify RN Care Manager of TOC call rescheduling needs Participate in Transition of Care Program/Attend TOC scheduled calls Take medications as prescribed   Pneumonia: Contact physician if: You have a new or higher fever. You are coughing more deeply or more often. You are having increased shortness of breath. You are not getting better after 2 days (48 hours). You do not get better as expected.  Plan:  The patient has been provided with contact information for the care management team and has been advised to call with any health related questions or concerns.         The patient verbalized understanding of instructions, educational materials, and care plan provided today and DECLINED offer to receive copy of patient instructions, educational materials, and care plan.   The patient has been provided with contact information for the care management team and has been advised  to call with any health related questions or concerns.   Please call the care guide team at [phone] if you need to cancel or reschedule your appointment.   Please call the Suicide and Crisis Lifeline: 988 if you are experiencing a Mental Health or Behavioral Health Crisis or need someone to talk to.  Muzammil Bruins J. Shavonte Zhao RN, MSN [facility], [facility] RN Care Manager Direct Dial: [phone]  Fax: [phone] Website: [email or web address]

## 2024-08-15 ENCOUNTER — Telehealth: Payer: Self-pay

## 2024-08-17 ENCOUNTER — Ambulatory Visit: Admitting: Physician Assistant

## 2024-08-17 DIAGNOSIS — L84 Corns and callosities: Secondary | ICD-10-CM | POA: Diagnosis not present

## 2024-08-18 ENCOUNTER — Encounter: Payer: Self-pay | Admitting: Physician Assistant

## 2024-08-18 ENCOUNTER — Ambulatory Visit: Attending: Family Medicine

## 2024-08-18 NOTE — Progress Notes (Signed)
 "  Office Visit Note   Patient: [name]           Date of Birth: April 25, 1931           MRN: [id number] Visit Date: 08/17/2024              Requested by: Gerome Brunet, DO [address] [place],  [place] [address] PCP: Gerome Brunet, DO  Chief Complaint  Patient presents with   Right Foot - Pain   Left Foot - Pain      HPI: 89 y/o ambulatory female was brought in by her daughter for exam of her feet.  She has harden skin areas on bothe feet that cause her pain with shoe ware.  Of note she is a diabetic.  Her most recent A1c was 08/05/24 6.2 which is down from a year ago of 6.9.  She denies history of non healing foot wounds in the past.    Assessment & Plan: Visit Diagnoses:  1. Callus of foot   2. Callus of heel     Plan: Calluses: Localized, pressure-induced thick, hard patches, especially on feet.  Bilateral medial heel pads and bilateral fifth MT base skin.   Wide shoe wear to prevent rubbing, heel cup to protect the heels they will purchase this on line.   F/U PRN for callus trimming  Follow-Up Instructions: Return if symptoms worsen or fail to improve.   Ortho Exam  Patient is alert, oriented, no adenopathy, well-dressed, normal affect, normal respiratory effort. Callus lateral heels and lateral base of fifth MT.  No open wounds, no edema or cellulitis.  After verbal consent I debrided the callus with a 10 blade.  She tolerated this well    Imaging: No results found. No images are attached to the encounter.  Labs: Lab Results  Component Value Date   HGBA1C 6.2 (H) 08/05/2024   HGBA1C 6.9 (H) 03/22/2023     Lab Results  Component Value Date   ALBUMIN 3.6 08/05/2024   ALBUMIN 3.6 07/30/2024   ALBUMIN 4.1 04/12/2023    Lab Results  Component Value Date   MG 1.7 08/05/2024   MG 2.0 03/24/2023   MG 1.9 03/23/2023   No results found for: VD25OH  No results found for: PREALBUMIN    Latest Ref Rng & Units 08/05/2024    4:24 AM  08/05/2024   12:02 AM 07/30/2024    8:10 PM  CBC EXTENDED  WBC 4.0 - 10.5 K/uL 4.0  3.7  7.6   RBC 3.87 - 5.11 MIL/uL 5.23  4.96  5.13   Hemoglobin 12.0 - 15.0 g/dL 85.7  86.3  85.8   HCT 36.0 - 46.0 % 41.5  40.0  41.4   Platelets 150 - 400 K/uL 184  164  71   NEUT# 1.7 - 7.7 K/uL  1.5  6.5   Lymph# 0.7 - 4.0 K/uL  1.7  0.8      There is no height or weight on file to calculate BMI.  Orders:  No orders of the defined types were placed in this encounter.  No orders of the defined types were placed in this encounter.    Procedures: No procedures performed  Clinical Data: No additional findings.  ROS:  All other systems negative, except as noted in the HPI. Review of Systems  Objective: Vital Signs: There were no vitals taken for this visit.  Specialty Comments:  No specialty comments available.  PMFS History: Patient Active Problem List  Diagnosis Date Noted   Multifocal pneumonia 08/05/2024   Hypertensive urgency 08/05/2024   Hypokalemia 08/05/2024   Elevated brain natriuretic peptide (BNP) level 08/05/2024   Neurodegenerative cognitive impairment 10/14/2023   Coronary artery disease 05/19/2023   Dizziness 03/21/2023   QT prolongation 03/21/2023   Normocytic anemia 03/21/2023   Dementia (HCC) 03/21/2023   Type 2 diabetes mellitus (HCC) 03/21/2023   Abnormal gait 05/19/2021   Amnesia 05/19/2021   Cervical disc disorder 05/19/2021   Diabetic peripheral neuropathy associated with type 2 diabetes mellitus (HCC) 05/19/2021   Diabetic renal disease (HCC) 05/19/2021   Dyslipidemia 05/19/2021   Localized, primary osteoarthritis of shoulder region 05/19/2021   Recurrent falls 05/19/2021   Essential hypertension 07/16/2020   Hyperlipidemia 07/16/2020   Critical limb ischemia with history of revascularization of same extremity (HCC) 07/16/2020   Past Medical History:  Diagnosis Date   Acid reflux    Arthritis    Dementia (HCC)    Diabetes mellitus without  complication (HCC)    High cholesterol    Hypertension     No family history on file.  Past Surgical History:  Procedure Laterality Date   ABDOMINAL HYSTERECTOMY     APPENDECTOMY     CATARACT EXTRACTION, BILATERAL     CHOLECYSTECTOMY     HEMORRHOID SURGERY     TONSILLECTOMY     Social History   Occupational History   Not on file  Tobacco Use   Smoking status: Never   Smokeless tobacco: Never  Substance and Sexual Activity   Alcohol use: No   Drug use: No   Sexual activity: Not on file       "

## 2024-08-25 ENCOUNTER — Ambulatory Visit: Admitting: Podiatry

## 2024-09-08 ENCOUNTER — Ambulatory Visit

## 2024-09-08 NOTE — Therapy (Incomplete)
 " OUTPATIENT PHYSICAL THERAPY LOWER EXTREMITY EVALUATION   Patient Name: [name] MRN: [id number] DOB:Aug 23, 1930, 89 y.o., female Today's Date: 09/08/2024  END OF SESSION:   Past Medical History:  Diagnosis Date   Acid reflux    Arthritis    Dementia (HCC)    Diabetes mellitus without complication (HCC)    High cholesterol    Hypertension    Past Surgical History:  Procedure Laterality Date   ABDOMINAL HYSTERECTOMY     APPENDECTOMY     CATARACT EXTRACTION, BILATERAL     CHOLECYSTECTOMY     HEMORRHOID SURGERY     TONSILLECTOMY     Patient Active Problem List   Diagnosis Date Noted   Multifocal pneumonia 08/05/2024   Hypertensive urgency 08/05/2024   Hypokalemia 08/05/2024   Elevated brain natriuretic peptide (BNP) level 08/05/2024   Neurodegenerative cognitive impairment 10/14/2023   Coronary artery disease 05/19/2023   Dizziness 03/21/2023   QT prolongation 03/21/2023   Normocytic anemia 03/21/2023   Dementia (HCC) 03/21/2023   Type 2 diabetes mellitus (HCC) 03/21/2023   Abnormal gait 05/19/2021   Amnesia 05/19/2021   Cervical disc disorder 05/19/2021   Diabetic peripheral neuropathy associated with type 2 diabetes mellitus (HCC) 05/19/2021   Diabetic renal disease (HCC) 05/19/2021   Dyslipidemia 05/19/2021   Localized, primary osteoarthritis of shoulder region 05/19/2021   Recurrent falls 05/19/2021   Essential hypertension 07/16/2020   Hyperlipidemia 07/16/2020   Critical limb ischemia with history of revascularization of same extremity (HCC) 07/16/2020    PCP: ***  REFERRING PROVIDER: ***  REFERRING DIAG: Unsteadiness on feet (R26.81);Other abnormalities of gait and mobility (R26.89);Muscle weakness (generalized) (M62.81)   THERAPY DIAG:  No diagnosis found.  Rationale for Evaluation and Treatment: Rehabilitation  ONSET DATE: 08/05/2023 (hospital admission date)  SUBJECTIVE:   SUBJECTIVE STATEMENT: Patient is reporting to PT after hx of 3 day  pneumonia related hospital admission. She was referred to OP PT d/t decreased activity tolerance, gait abnormalities, unsteadiness and generalized muscle weakness. She states ***.   PERTINENT HISTORY: ***  PAIN:  Are you having pain? Yes: NPRS scale: *** Pain location: *** Pain description: *** Aggravating factors: *** Relieving factors: ***  PRECAUTIONS: {Therapy precautions:24002}  RED FLAGS: {PT Red Flags:29287}   WEIGHT BEARING RESTRICTIONS: {Yes ***/No:24003}  FALLS:  Has patient fallen in last 6 months? Yes. Number of falls ***  LIVING ENVIRONMENT: Lives with: {[facility] lives with:25569::lives with their family} Lives in: {Lives in:25570} Stairs: {opstairs:27293} Has following equipment at home: {Assistive devices:23999}  OCCUPATION: ***  PLOF: {PLOF:24004}  PATIENT GOALS: ***  NEXT MD VISIT: ***  OBJECTIVE:  Note: Objective measures were completed at Evaluation unless otherwise noted.  DIAGNOSTIC FINDINGS: ***  PATIENT SURVEYS:  {rehab surveys:24030}  COGNITION: Overall cognitive status: {cognition:24006}     SENSATION: {sensation:27233}  EDEMA:  {edema:24020}  MUSCLE LENGTH: Hamstrings: Right *** deg; Left *** deg Debby test: Right *** deg; Left *** deg  POSTURE: {posture:25561}  PALPATION: ***  LOWER EXTREMITY ROM:  {AROM/PROM:27142} ROM Right eval Left eval  Hip flexion    Hip extension    Hip abduction    Hip adduction    Hip internal rotation    Hip external rotation    Knee flexion    Knee extension    Ankle dorsiflexion    Ankle plantarflexion    Ankle inversion    Ankle eversion     (Blank rows = not tested)  LOWER EXTREMITY MMT:  MMT Right eval Left eval  Hip flexion  Hip extension    Hip abduction    Hip adduction    Hip internal rotation    Hip external rotation    Knee flexion    Knee extension    Ankle dorsiflexion    Ankle plantarflexion    Ankle inversion    Ankle eversion     (Blank rows = not  tested)  LOWER EXTREMITY SPECIAL TESTS:  {LEspecialtests:26242}  FUNCTIONAL TESTS:  {Functional tests:24029}  GAIT: Distance walked: *** Assistive device utilized: {Assistive devices:23999} Level of assistance: {Levels of assistance:24026} Comments: ***                                                                                                                                TREATMENT DATE: ***    PATIENT EDUCATION:  Education details: *** Person educated: {Person educated:25204} Education method: {Education Method:25205} Education comprehension: {Education Comprehension:25206}  HOME EXERCISE PROGRAM: ***  ASSESSMENT:  CLINICAL IMPRESSION: Patient is a *** y.o. *** who was seen today for physical therapy evaluation and treatment for ***.   OBJECTIVE IMPAIRMENTS: {opptimpairments:25111}.   ACTIVITY LIMITATIONS: {activitylimitations:27494}  PARTICIPATION LIMITATIONS: {participationrestrictions:25113}  PERSONAL FACTORS: {Personal factors:25162} are also affecting patient's functional outcome.   REHAB POTENTIAL: {rehabpotential:25112}  CLINICAL DECISION MAKING: {clinical decision making:25114}  EVALUATION COMPLEXITY: {Evaluation complexity:25115}   GOALS: Goals reviewed with patient? {yes/no:20286}  SHORT TERM GOALS: Target date: *** *** Baseline: Goal status: INITIAL  2.  *** Baseline:  Goal status: INITIAL  3.  *** Baseline:  Goal status: INITIAL  4.  *** Baseline:  Goal status: INITIAL  5.  *** Baseline:  Goal status: INITIAL  6.  *** Baseline:  Goal status: INITIAL  LONG TERM GOALS: Target date: ***  *** Baseline:  Goal status: INITIAL  2.  *** Baseline:  Goal status: INITIAL  3.  *** Baseline:  Goal status: INITIAL  4.  *** Baseline:  Goal status: INITIAL  5.  *** Baseline:  Goal status: INITIAL  6.  *** Baseline:  Goal status: INITIAL   PLAN:  PT FREQUENCY: {rehab frequency:25116}  PT DURATION: {rehab  duration:25117}  PLANNED INTERVENTIONS: {rehab planned interventions:25118::97110-Therapeutic exercises,97530- Therapeutic [phone]- Neuromuscular re-education,97535- Self Rjmz,02859- Manual therapy,Patient/Family education}  PLAN FOR NEXT SESSION: PIERRETTE Marko Molt, PT 09/08/2024, 9:12 AM  "

## 2024-09-14 ENCOUNTER — Ambulatory Visit: Admitting: Physician Assistant
